# Patient Record
Sex: Female | Born: 1960
Health system: Southern US, Community
[De-identification: ages and names within clinical notes are randomized; demographics above are authoritative.]

## PROBLEM LIST (undated history)

## (undated) DIAGNOSIS — C499 Malignant neoplasm of connective and soft tissue, unspecified: Secondary | ICD-10-CM

## (undated) DIAGNOSIS — M1990 Unspecified osteoarthritis, unspecified site: Secondary | ICD-10-CM

## (undated) DIAGNOSIS — Z9889 Other specified postprocedural states: Secondary | ICD-10-CM

## (undated) DIAGNOSIS — I82409 Acute embolism and thrombosis of unspecified deep veins of unspecified lower extremity: Secondary | ICD-10-CM

## (undated) DIAGNOSIS — R51 Headache: Secondary | ICD-10-CM

## (undated) DIAGNOSIS — R519 Headache, unspecified: Secondary | ICD-10-CM

## (undated) DIAGNOSIS — A609 Anogenital herpesviral infection, unspecified: Secondary | ICD-10-CM

## (undated) DIAGNOSIS — T7840XA Allergy, unspecified, initial encounter: Secondary | ICD-10-CM

## (undated) DIAGNOSIS — R112 Nausea with vomiting, unspecified: Secondary | ICD-10-CM

## (undated) HISTORY — DX: Other specified postprocedural states: R11.2

## (undated) HISTORY — DX: Acute embolism and thrombosis of unspecified deep veins of unspecified lower extremity: I82.409

## (undated) HISTORY — DX: Anogenital herpesviral infection, unspecified: A60.9

## (undated) HISTORY — PX: OTHER SURGICAL HISTORY: SHX169

## (undated) HISTORY — PX: ABDOMINAL HYSTERECTOMY: SHX81

## (undated) HISTORY — DX: Malignant neoplasm of connective and soft tissue, unspecified: C49.9

## (undated) HISTORY — DX: Allergy, unspecified, initial encounter: T78.40XA

## (undated) HISTORY — DX: Other specified postprocedural states: Z98.890

## (undated) HISTORY — PX: NECK SURGERY: SHX720

---

## 1998-01-23 ENCOUNTER — Other Ambulatory Visit: Admission: RE | Admit: 1998-01-23 | Discharge: 1998-01-23 | Payer: Self-pay | Admitting: Gynecology

## 1999-03-24 HISTORY — PX: OTHER SURGICAL HISTORY: SHX169

## 1999-03-27 ENCOUNTER — Other Ambulatory Visit: Admission: RE | Admit: 1999-03-27 | Discharge: 1999-03-27 | Payer: Self-pay | Admitting: Gynecology

## 1999-04-02 ENCOUNTER — Other Ambulatory Visit: Admission: RE | Admit: 1999-04-02 | Discharge: 1999-04-02 | Payer: Self-pay | Admitting: Gynecology

## 1999-10-03 ENCOUNTER — Ambulatory Visit (HOSPITAL_COMMUNITY): Admission: RE | Admit: 1999-10-03 | Discharge: 1999-10-03 | Payer: Self-pay | Admitting: *Deleted

## 1999-10-03 ENCOUNTER — Encounter: Payer: Self-pay | Admitting: *Deleted

## 1999-10-14 ENCOUNTER — Encounter: Admission: RE | Admit: 1999-10-14 | Discharge: 1999-12-04 | Payer: Self-pay | Admitting: *Deleted

## 1999-10-27 ENCOUNTER — Other Ambulatory Visit: Admission: RE | Admit: 1999-10-27 | Discharge: 1999-10-27 | Payer: Self-pay | Admitting: *Deleted

## 1999-11-13 ENCOUNTER — Encounter (INDEPENDENT_AMBULATORY_CARE_PROVIDER_SITE_OTHER): Payer: Self-pay

## 1999-11-13 ENCOUNTER — Ambulatory Visit (HOSPITAL_COMMUNITY): Admission: RE | Admit: 1999-11-13 | Discharge: 1999-11-13 | Payer: Self-pay | Admitting: *Deleted

## 2000-02-27 ENCOUNTER — Other Ambulatory Visit: Admission: RE | Admit: 2000-02-27 | Discharge: 2000-02-27 | Payer: Self-pay | Admitting: *Deleted

## 2001-01-04 ENCOUNTER — Other Ambulatory Visit: Admission: RE | Admit: 2001-01-04 | Discharge: 2001-01-04 | Payer: Self-pay | Admitting: *Deleted

## 2002-04-22 ENCOUNTER — Emergency Department (HOSPITAL_COMMUNITY): Admission: EM | Admit: 2002-04-22 | Discharge: 2002-04-22 | Payer: Self-pay

## 2002-04-24 ENCOUNTER — Other Ambulatory Visit: Admission: RE | Admit: 2002-04-24 | Discharge: 2002-04-24 | Payer: Self-pay | Admitting: *Deleted

## 2002-09-21 ENCOUNTER — Encounter (INDEPENDENT_AMBULATORY_CARE_PROVIDER_SITE_OTHER): Payer: Self-pay | Admitting: Specialist

## 2002-09-21 ENCOUNTER — Ambulatory Visit (HOSPITAL_BASED_OUTPATIENT_CLINIC_OR_DEPARTMENT_OTHER): Admission: RE | Admit: 2002-09-21 | Discharge: 2002-09-21 | Payer: Self-pay | Admitting: *Deleted

## 2003-06-26 ENCOUNTER — Encounter: Admission: RE | Admit: 2003-06-26 | Discharge: 2003-06-26 | Payer: Self-pay | Admitting: Family Medicine

## 2004-07-31 ENCOUNTER — Encounter: Admission: RE | Admit: 2004-07-31 | Discharge: 2004-07-31 | Payer: Self-pay | Admitting: Family Medicine

## 2004-08-12 ENCOUNTER — Encounter: Admission: RE | Admit: 2004-08-12 | Discharge: 2004-08-12 | Payer: Self-pay | Admitting: Family Medicine

## 2004-09-17 ENCOUNTER — Encounter: Admission: RE | Admit: 2004-09-17 | Discharge: 2004-09-17 | Payer: Self-pay | Admitting: Family Medicine

## 2005-09-22 ENCOUNTER — Encounter: Admission: RE | Admit: 2005-09-22 | Discharge: 2005-09-22 | Payer: Self-pay | Admitting: *Deleted

## 2006-04-06 ENCOUNTER — Encounter (INDEPENDENT_AMBULATORY_CARE_PROVIDER_SITE_OTHER): Payer: Self-pay | Admitting: *Deleted

## 2006-04-06 ENCOUNTER — Ambulatory Visit (HOSPITAL_COMMUNITY): Admission: RE | Admit: 2006-04-06 | Discharge: 2006-04-07 | Payer: Self-pay | Admitting: *Deleted

## 2006-10-14 ENCOUNTER — Encounter: Admission: RE | Admit: 2006-10-14 | Discharge: 2006-10-14 | Payer: Self-pay | Admitting: Family Medicine

## 2007-10-17 ENCOUNTER — Encounter: Admission: RE | Admit: 2007-10-17 | Discharge: 2007-10-17 | Payer: Self-pay | Admitting: Family Medicine

## 2008-11-09 ENCOUNTER — Encounter: Admission: RE | Admit: 2008-11-09 | Discharge: 2008-11-09 | Payer: Self-pay | Admitting: Family Medicine

## 2009-07-15 ENCOUNTER — Encounter: Admission: RE | Admit: 2009-07-15 | Discharge: 2009-07-15 | Payer: Self-pay | Admitting: Family Medicine

## 2009-11-11 ENCOUNTER — Encounter: Admission: RE | Admit: 2009-11-11 | Discharge: 2009-11-11 | Payer: Self-pay | Admitting: Family Medicine

## 2010-06-12 ENCOUNTER — Ambulatory Visit (INDEPENDENT_AMBULATORY_CARE_PROVIDER_SITE_OTHER): Payer: Self-pay | Admitting: Gastroenterology

## 2010-06-12 ENCOUNTER — Encounter: Payer: Self-pay | Admitting: Gastroenterology

## 2010-06-12 VITALS — BP 106/80 | HR 84 | Ht 70.0 in | Wt 200.2 lb

## 2010-06-12 DIAGNOSIS — K3189 Other diseases of stomach and duodenum: Secondary | ICD-10-CM

## 2010-06-12 DIAGNOSIS — R1013 Epigastric pain: Secondary | ICD-10-CM | POA: Insufficient documentation

## 2010-06-12 MED ORDER — DEXLANSOPRAZOLE 60 MG PO CPDR: 60.0000 mg | DELAYED_RELEASE_CAPSULE | Freq: Every day | ORAL | Status: AC

## 2010-06-12 NOTE — Assessment & Plan Note (Signed)
The patient's symptoms may be due to ulcer or non-ulcer dyspepsia.  Recommendations  #1 dexilant 60 mg before breakfast  #2 upper endoscopy

## 2010-06-12 NOTE — Progress Notes (Signed)
History of Present Illness: Ms. Michelle Contreras iIs a pleasant, 50 year old white female here for evaluation of abdominal discomfort. For the past several weeks she has noted postprandial abdominal bloating, distention and fullness. Fullness may last for several hours and radiate into her chest and neck. She denies nausea , per se. She is taking omeprazole, without much relief. She is on no gastric irritants including nonsteroidals.     Review of Systems: Pertinent positive and negative review of systems were noted in the above HPI section. All other review of systems were otherwise negative.    Current Medications, Allergies, Past Medical History, Past Surgical History, Family History and Social History were reviewed in [employer] electronic medical record  Vital signs were reviewed in today's medical record. Physical Exam: General: Well developed , well nourished, no acute distress Head: Normocephalic and atraumatic Eyes:  sclerae anicteric, EOMI Ears: Normal auditory acuity Mouth: No deformity or lesions Lungs: Clear throughout to auscultation Heart: Regular rate and rhythm; no murmurs, rubs or bruits Abdomen: Soft, non tender and non distended. No masses, hepatosplenomegaly or hernias noted. Normal Bowel sounds.  There is no succussion splash Rectal:deferred Musculoskeletal: Symmetrical with no gross deformities  Pulses:  Normal pulses noted Extremities: No clubbing, cyanosis, edema or deformities noted Neurological: Alert oriented x 4, grossly nonfocal Psychological:  Alert and cooperative. Normal mood and affect

## 2010-06-12 NOTE — Patient Instructions (Signed)
Upper GI Endoscopy Upper GI endoscopy means using a flexible scope to look at the esophagus, stomach and upper small bowel. This is done to make a diagnosis in people with heartburn, abdominal pain, or abnormal bleeding. Sometimes an endoscope is needed to remove foreign bodies or food that become stuck in the esophagus; it can also be used to take biopsy samples. For the best results, do not eat or drink for 8 hours before having your upper endoscopy.  To perform the endoscopy, you will probably be sedated and your throat will be numbed with a special spray. The endoscope is then slowly passed down your throat (this will not interfere with your breathing). An endoscopy exam takes 15-30 minutes to complete and there is no real pain. Patients rarely remember much about the procedure. The results of the test may take several days if a biopsy or other test is taken.  You may have a sore throat after an endoscopy exam. Serious complications are very rare. Stick to liquids and soft foods until your pain is better. You should not drive a car or operate any dangerous equipment for at least 24 hours after being sedated. SEEK IMMEDIATE MEDICAL CARE IF:  You have severe throat pain.   You have shortness of breath.   You have bleeding problems.   You have a fever.   You have difficulty recovering from your sedation.  Document Released: 04/16/2004 Document Re-Released: 06/03/2009 [facility] Patient Information 2011 [place], [place].  YOUR EGD IS SCHEDULED ON 06/17/2010 AT 2PM

## 2010-06-13 ENCOUNTER — Encounter: Payer: Self-pay | Admitting: Gastroenterology

## 2010-06-16 ENCOUNTER — Encounter: Payer: Self-pay | Admitting: Gastroenterology

## 2010-06-17 ENCOUNTER — Ambulatory Visit (AMBULATORY_SURGERY_CENTER): Payer: Managed Care, Other (non HMO) | Admitting: Gastroenterology

## 2010-06-17 ENCOUNTER — Encounter: Payer: Self-pay | Admitting: Gastroenterology

## 2010-06-17 ENCOUNTER — Other Ambulatory Visit: Payer: Self-pay | Admitting: Gastroenterology

## 2010-06-17 VITALS — BP 109/62 | HR 73 | Temp 97.7°F | Resp 20 | Ht 70.0 in | Wt 199.0 lb

## 2010-06-17 DIAGNOSIS — K2980 Duodenitis without bleeding: Secondary | ICD-10-CM

## 2010-06-17 DIAGNOSIS — R1013 Epigastric pain: Secondary | ICD-10-CM

## 2010-06-17 DIAGNOSIS — K2990 Gastroduodenitis, unspecified, without bleeding: Secondary | ICD-10-CM

## 2010-06-17 DIAGNOSIS — R109 Unspecified abdominal pain: Secondary | ICD-10-CM

## 2010-06-17 DIAGNOSIS — K5289 Other specified noninfective gastroenteritis and colitis: Secondary | ICD-10-CM

## 2010-06-17 NOTE — Patient Instructions (Addendum)
Call office in 1 week if symptoms are not improved with dexilant. Pt given dc instructions & reviewed w/ pt & care partner to call office set up appointment w/ Dr. Arlyce Dice for 4-6 weeks.

## 2010-06-18 ENCOUNTER — Telehealth: Payer: Self-pay | Admitting: *Deleted

## 2010-06-18 NOTE — Telephone Encounter (Signed)

## 2010-06-24 ENCOUNTER — Encounter: Payer: Self-pay | Admitting: Gastroenterology

## 2010-06-24 NOTE — Procedures (Signed)
Summary: Upper Endoscopy  Patient: Michelle Contreras Note: All result statuses are Final unless otherwise noted.  Tests: (1) Upper Endoscopy (EGD)   EGD Upper Endoscopy       DONE     [facility]     [address]     [place], [place]  [address]          ENDOSCOPY PROCEDURE REPORT          PATIENT:  Michelle, Contreras  MR#:  [id number]     BIRTHDATE:  [birth year], 49 yrs. old  GENDER:  female          ENDOSCOPIST:  Barbette Hair. Arlyce Dice, MD     Referred by:          PROCEDURE DATE:  06/17/2010     PROCEDURE:  EGD, diagnostic 43235     ASA CLASS:  Class I     INDICATIONS:  abdominal pain, dyspepsia          MEDICATIONS:   Fentanyl 100 mcg IV, Versed 10 mg IV,     glycopyrrolate (Robinal) 0.2 mg IV, 0.6cc simethancone 0.6 cc PO     TOPICAL ANESTHETIC:  Exactacain Spray          DESCRIPTION OF PROCEDURE:   After the risks benefits and     alternatives of the procedure were thoroughly explained, informed     consent was obtained.  The LB GIF-H180 [id number] endoscope was     introduced through the mouth and advanced to the third portion of     the duodenum, without limitations.  The instrument was slowly     withdrawn as the mucosa was fully examined.     <<PROCEDUREIMAGES>>          Duodenitis was found in the bulb of the duodenum. Mild erythema     and edema. Bx taken (see image1).  Otherwise the examination was     normal (see image2, image4, image5, image6, image7, image8, and     image9).    Retroflexed views revealed no abnormalities.    The     scope was then withdrawn from the patient and the procedure     completed.          COMPLICATIONS:  None          ENDOSCOPIC IMPRESSION:     1) Duodenitis in the bulb of duodenum     2) Otherwise normal examination     RECOMMENDATIONS:     1) continue PPI; call office if symptoms do no improve over next     week     2) Call office next 2-3 days to schedule an office appointment     for 4-6 weeks          REPEAT EXAM:   No          ______________________________     Barbette Hair. Arlyce Dice, MD          CC:          n.     eSIGNED:   Barbette Hair. Kaplan at 06/17/2010 02:56 PM          Lemar Lofty, [id number]  Note: An exclamation mark (!) indicates a result that was not dispersed into the flowsheet. Document Creation Date: 06/17/2010 2:56 PM _______________________________________________________________________  (1) Order result status: Final Collection or observation date-time: 06/17/2010 14:42 Requested date-time:  Receipt date-time:  Reported date-time:  Referring Physician:   Ordering Physician: Melvia Heaps [phone]) Specimen Source:  Source: Launa Grill Order Number: [id number] Lab site:

## 2010-07-31 ENCOUNTER — Telehealth: Payer: Self-pay | Admitting: Gastroenterology

## 2010-07-31 NOTE — Telephone Encounter (Signed)
Spoke with pt and rescheduled her appt with Dr. Arlyce Dice to 08/14/10@9 :30am. Pt aware of appt date and time.

## 2010-08-04 ENCOUNTER — Ambulatory Visit: Payer: Managed Care, Other (non HMO) | Admitting: Gastroenterology

## 2010-08-08 NOTE — Op Note (Signed)
   NAME:  Michelle Contreras, Michelle Contreras                         ACCOUNT NO.:  [id number]   MEDICAL RECORD NO.:  [id number]                   PATIENT TYPE:  AMB   LOCATION:  [facility]                                 FACILITY:  [facility]   PHYSICIAN:  Gerri Spore B. Earlene Plater, M.D.               DATE OF BIRTH:  07/11/1960   DATE OF PROCEDURE:  09/21/2002  DATE OF DISCHARGE:                                 OPERATIVE REPORT   PREOPERATIVE DIAGNOSIS:  Nine-week missed abortion.   POSTOPERATIVE DIAGNOSIS:  Nine-week missed abortion.   PROCEDURE:  Suction curettage.   SURGEON:  Chester Holstein. Earlene Plater, M.D.   ANESTHESIA:  LMA general.   FINDINGS:  Examination under anesthesia:  A 10-week size anteverted uterus,  no adnexal masses.   SPECIMENS:  Products of conception.   ESTIMATED BLOOD LOSS:  300 due to mild uterine atony, treated with  Methergine 0.2 mg IM x1 and uterine massage.   COMPLICATIONS:  None.   INDICATIONS:  Patient found to have a nine-week missed abortion in the  office yesterday when no fetal heart tones were heard at 10 weeks.  The  ultrasound confirmed the embryonic demise.  The patient desires operative  management.   PROCEDURE:  The patient was taken to the operating room and LMA general  obtained.  She was placed in the ski position and prepped and draped in  standard fashion.  Bladder emptied with red rubber catheter.  Speculum  inserted.  Anterior lip of the cervix grasped with a single-tooth tenaculum,  cervix easily dilated to a #27.   A #9 curved suction cannula was inserted with suction on.  Products  returned.  Several passes were needed to clear the uterus.  There was brisk  bleeding noted after evacuation began but no concern for perforation.  The  uterus was gently curetted and a gritty texture noted.  Suction passed once  again, no additional products returned.  The patient was given Methergine  0.2 mg IM and uterine massage, given the brisk bleeding.   After the above measures, the  bleeding promptly ceased.  The cervix was  observed and no additional bleeding encountered.  Repeat pelvic exam showed  no change from prior to surgery.   The patient tolerated the procedure well and there were no complications.  She was taken to the recovery room awake and alert, in stable condition.                                               Gerri Spore B. Earlene Plater, M.D.    WBD/MEDQ  D:  09/21/2002  T:  09/21/2002  Job:  [id number]

## 2010-08-08 NOTE — Op Note (Signed)
[facility]  Patient:    Michelle Contreras, Michelle Contreras                      MRN: [id number] Proc. Date: 11/13/99 Adm. Date:  [id number] Attending:  Marina Gravel B                           Operative Report  PREOPERATIVE DIAGNOSES:       1. Desires elective termination of an 8-week                                  pregnancy.                               2. 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  POSTOPERATIVE DIAGNOSES:      1. Desires elective termination of an 8-week                                  pregnancy.                               2. 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  OPERATION:                    Suction dilatation, evacuation, and curettage.  SURGEON:                      Marina Gravel, M.D.  ASSISTANT:  ANESTHESIA:                   MAC and 20 cc of 1% lidocaine as a paracervical block.  ESTIMATED BLOOD LOSS:         30 cc.  FINDINGS:                     At examination under anesthesia, approximately an 8-week size anterior uterus.  No adnexal masses palpable.  COMPLICATIONS:                None.  DESCRIPTION OF PROCEDURE:     The patient was taken to the operating room and MAC anesthesia obtained.  She was placed in the ski position and prepped and draped in the standard fashion.  The bladder was emptied with a straight catheter.  The patient was examined under anesthesia with the above findings noted.  A speculum was placed into the vagina and the cervix visualized.  It was infiltrated with 20 cc of 1% lidocaine as a paracervical block.  The cervix was then grasped with a tenaculum on the anterior lip.  The cervix was then easily dilated to a #21 without difficulty.  A #7 suction curette was then inserted into the endometrial cavity.  Suction  was obtained and products returned.  This was continued for two  to three more passes until no tissue returned.  Then the uterus was gently curetted.  A moderate amount of tissue returned from the anterior portion of the cavity.  When a gritty texture was encountered throughout, curetting was discontinued.  Suction curette was placed one more time until no products returned.  All instruments were removed from the vagina and the cervix was hemostatic. The patient tolerated the procedure well without complications.  She was taken to the recovery room, awake, alert, and in stable condition.  The patients blood type is A+. DD:  11/13/99 TD:  11/14/99 Job: [id number]

## 2010-08-08 NOTE — Op Note (Signed)
Michelle Contreras, Michelle Contreras NO.:  [id number]   MEDICAL RECORD NO.:  [id number]          PATIENT TYPE:  AMB   LOCATION:  [facility]                           FACILITY:  [facility]   PHYSICIAN:  [place] B. Earlene Plater, M.D.  DATE OF BIRTH:  Sep 30, 1960   DATE OF PROCEDURE:  04/06/2006  DATE OF DISCHARGE:                               OPERATIVE REPORT   PREOPERATIVE DIAGNOSES:  1. Abnormal uterine bleeding.  2. History of menstrual migraines.  3. Endometriosis.   POSTOPERATIVE DIAGNOSES:  1. Abnormal uterine bleeding.  2. History of menstrual migraines.  3. Endometriosis.   PROCEDURE:  Total laparoscopic hysterectomy, bilateral salpingo-  oophorectomy.   SURGEON:  Chester Holstein. Earlene Plater, M.D.   ASSISTANT:  Lenoard Aden, M.D.   ANESTHESIA:  General.   FINDINGS:  Endometriosis of the posterior cul-de-sac.   SPECIMENS:  Was a 276 gram uterus, cervix, tubes and ovaries, submitted  to pathology.   ESTIMATED BLOOD LOSS:  Was 50 mL.   COMPLICATIONS:  None.   INDICATIONS FOR PROCEDURE:  A patient with the above history for  definitive surgical therapy.  Requests a bilateral salpingo-  oophorectomy, due to a history of endometriosis and menstrual migraines.  Understands she will no doubt need hormone replacement therapy.  We have  discussed a WHI trial with Premarin only, showing only an increased risk  for VTE without the increased risk of breast cancer and heart disease,  still not 100% clear.  Also discussed the reduced risk of osteoporosis  and colon cancer.  The patient was advised the risks of surgery,  including infection, bleeding, damage to tissue or surrounding organs  and the potential need to convert to another type of hysterectomy.   DESCRIPTION OF PROCEDURE:  The patient was taken to the operating room  and general anesthesia was obtained.  She was placed in the [place]  stirrups and prepped and draped in the standard fashion.  A Foley  catheter was inserted to the  bladder.  A Rumi device was assembled and  secured in the standard manner, after the uterus was sounded to 10 cm.   A 10 mm incision placed in the umbilicus and carried sharply to the  fascia.  The fascia was divided sharply and elevated with Kocher clamps.  The posterior sheath and peritoneum were elevated and entered sharply.  A pursestring suture of #0 Vicryl was placed around the fascial defect.  A Hasson cannula was inserted and secured.  A pneumoperitoneum with CO2  gas.  An 11 mm XR trocar was placed in the left lower quadrant and a 5  mm on the right, each under direct laparoscopic visualization.   Trendelenburg position obtained.  The bowel was mobilized superiorly.  The course of each ureter identified and found to be well out of the  way.  The left IP ligament was placed on traction, sealed and divided  with the Gyrus bipolar cutting forceps.  The round ligament similarly  sealed and divided.  The broad ligament was skeletonized.  The bladder  flap created sharply.  The entire procedure was repeated on the  right  side in the same manner.  The left uterine artery was then skeletonized,  sealed and divided and repeated on the right side in the same manner.   The KOH cup was elevated and a colpotomy made anteriorly first with a  Plasma spatula, carried around to the angles laterally.  A posterior  colpotomy made similarly.  Each angle was then sealed and divided with  the Gyrus bipolar.  The specimen was pulled into the vagina, which  maintained a pneumo.  The vaginal cuff was closed with interrupted plain  sutures of #0 Vicryl laparoscopically.  Hemostasis obtained.  Lines of  dissection were inspected and were hemostatic and remained hemostatic,  with a reduction of the pneumoperitoneum.  The pelvis was irrigated.  No  additional abnormalities seen.  Therefore the procedure was terminated.   The inferior ports were removed and their sites inspected  laparoscopically and were  hemostatic.  The scope removed and gas  released.  The Hasson cannula removed.  The umbilical incision was  elevated and the fascia stitch tied down.  This obliterated the fascial  defect.  No intra-abdominal contents herniated through prior to closure.  The skin was closed at each site with Dermabond.   The patient tolerated the procedure with no known complications.  She  was taken to the recovery room  alert and in stable condition.      Gerri Spore B. Earlene Plater, M.D.  Electronically Signed     WBD/MEDQ  D:  04/06/2006  T:  04/06/2006  Job:  [id number]

## 2010-08-08 NOTE — H&P (Signed)
   NAME:  Michelle Contreras, Michelle Contreras                         ACCOUNT NO.:  [id number]   MEDICAL RECORD NO.:  [id number]                   PATIENT TYPE:  HAMB   LOCATION:                                       FACILITY:  [facility]   PHYSICIAN:  Gerri Spore B. Earlene Plater, M.D.               DATE OF BIRTH:  01/18/1961   DATE OF ADMISSION:  09/21/2002  DATE OF DISCHARGE:                                HISTORY & PHYSICAL   PREOPERATIVE DIAGNOSIS:  A nine-week missed abortion.   PROCEDURE:  Suction curettage.   HISTORY OF PRESENT ILLNESS:  A 50 year old white female gravida 2, para 0,  A1, 10+ weeks by LMP, seen in the office on September 20, 2002, for first  prenatal visit.  She was found to have absent fetal heart tones.  Subsequent  pelvic ultrasound confirmed nine-week embryonic demise.  The patient desires  operative treatment.   PAST MEDICAL HISTORY:  1. Lower extremity sarcoma.  2. Status post below knee amputation on the right.  3. History of HSV, no recent symptoms.   PAST SURGICAL HISTORY:  1. Amputation as above.  2. Therapeutic abortion x1.   MEDICATIONS:  Prenatal vitamins.   ALLERGIES:  CODEINE and ADHESIVE SURGICAL TAPES.   HABITS:  No alcohol, tobacco or drugs.   FAMILY HISTORY:  Noncontributory.   REVIEW OF SYSTEMS:  Otherwise negative.   PHYSICAL EXAMINATION:  GENERAL APPEARANCE:  Alert and oriented and in no  acute distress.  VITAL SIGNS:  Height 5 feet 9 inches, weight 189 pounds, blood pressure  100/80.  SKIN:  Warm without lesions.  HEART:  Regular rate and rhythm.  LUNGS: Clear to auscultation.  ABDOMEN:  Benign.  No hernias.  EXTREMITIES:  Lower extremities status post right below-knee amputation with  prosthetic present.  PELVIC:  Normal external genitalia, vagina and cervix normal.  Uterus 9 to  10-week size, mid plane, no fetal heart tones noted.   Pelvic ultrasound confirmed a 9-week __________; no fetal cardiac  activities.   ASSESSMENT:  A nine-week embryonic demise.   Desires operative management.   PLAN:  Suction curettage.  Risks discussed including infection, bleeding,  uterine perforation, injury to surrounding organs; all questions answered.  The patient wished to proceed.                                               Gerri Spore B. Earlene Plater, M.D.    WBD/MEDQ  D:  09/20/2002  T:  09/20/2002  Job:  [id number]

## 2010-08-14 ENCOUNTER — Encounter: Payer: Self-pay | Admitting: Gastroenterology

## 2010-08-14 ENCOUNTER — Ambulatory Visit (INDEPENDENT_AMBULATORY_CARE_PROVIDER_SITE_OTHER): Payer: Managed Care, Other (non HMO) | Admitting: Gastroenterology

## 2010-08-14 VITALS — BP 124/68 | HR 80 | Ht 70.0 in | Wt 194.0 lb

## 2010-08-14 DIAGNOSIS — Z800 Family history of malignant neoplasm of digestive organs: Secondary | ICD-10-CM

## 2010-08-14 MED ORDER — PEG-KCL-NACL-NASULF-NA ASC-C 100 G PO SOLR: 1.0000 | ORAL | Status: DC

## 2010-08-14 NOTE — Progress Notes (Signed)
History of Present Illness:  Michelle Contreras returns for followup of her abdominal pain. Pain has entirely subsided after restricting her diet. She no longer takes caffeine and tries to maintain a diet heavy in vegetables. On this regimen she is pain-free. Prior to this she was still having some discomfort despite taking omeprazole.  Family history is pertinent for her father who developed colon cancer in his 92s. Her father's 2 uncles had colon cancer. Her last colonoscopy was 2004.    Review of Systems: Pertinent positive and negative review of systems were noted in the above HPI section. All other review of systems were otherwise negative.    Current Medications, Allergies, Past Medical History, Past Surgical History, Family History and Social History were reviewed in [employer] electronic medical record  Vital signs were reviewed in today's medical record. Physical Exam: General: Well developed , well nourished, no acute distress

## 2010-08-14 NOTE — Patient Instructions (Signed)
You have been scheduled for a Colonoscopy. Written instructions have been provided. Your Prep has been sent to your pharmacy.  Colonoscopy A colonoscopy is an exam to evaluate your entire colon. In this exam, your colon is cleansed. A long fiberoptic tube is inserted through your rectum and into your colon. The fiberoptic scope (endoscope) is a long bundle of enclosed and very flexible fibers. These fibers transmit light to the area examined and send images from that area to your caregiver. Discomfort is usually minimal. You may be given a drug to help you sleep (sedative) during or prior to the procedure. This exam helps to detect lumps (tumors), polyps, inflammation, and areas of bleeding. Your caregiver may also take a small piece of tissue (biopsy) that will be examined under a microscope. BEFORE THE PROCEDURE  A clear liquid diet may be required for 2 days before the exam.   Liquid injections (enemas) or laxatives may be required.   A large amount of electrolyte solution may be given to you to drink over a short period of time. This solution is used to clean out your colon.   You should be present  prior to your procedure or as directed by your caregiver.   Check in at the admissions desk to fill out necessary forms if not preregistered. There will be consent forms to sign prior to the procedure. If accompanied by friends or family, there is a waiting area for them while you are having your procedure.  LET YOUR CAREGIVER KNOW ABOUT:  Allergies to food or medicine.  Medicines taken, including vitamins, herbs, eyedrops, over-the-counter medicines, and creams.   Use of steroids (by mouth or creams).   Previous problems with anesthetics or numbing medicines.   History of bleeding problems or blood clots.  Previous surgery.   Other health problems, including diabetes and kidney problems.   Possibility of pregnancy, if this applies.   AFTER THE PROCEDURE  If you received a sedative  and/or pain medicine, you will need to arrange for someone to drive you home.   Occasionally, there is a little blood passed with the first bowel movement. DO NOT be concerned.  HOME CARE INSTRUCTIONS  It is not unusual to pass moderate amounts of gas and experience mild abdominal cramping following the procedure. This is due to air being used to inflate your colon during the exam. Walking or a warm pack on your belly (abdomen) may help.   You may resume all normal meals and activities after sedatives and medicines have worn off.   Only take over-the-counter or prescription medicines for pain, discomfort, or fever as directed by your caregiver. DO NOT use aspirin or blood thinners if a biopsy was taken. Consult your caregiver for medicine usage if biopsies were taken.  FINDING OUT THE RESULTS OF YOUR TEST Not all test results are available during your visit. If your test results are not back during the visit, make an appointment with your caregiver to find out the results. Do not assume everything is normal if you have not heard from your caregiver or the medical facility. It is important for you to follow up on all of your test results. SEEK IMMEDIATE MEDICAL CARE IF:  You have an oral temperature above , not controlled by medicine.   You pass large blood clots or fill a toilet with blood following the procedure. This may also occur 10 to 14 days following the procedure. This is more likely if a biopsy was taken.  You develop abdominal pain that keeps getting worse and cannot be relieved with medicine.  Document Released: 03/06/2000 Document Re-Released: 06/03/2009 [facility] Patient Information 2011 [place], [place].

## 2010-08-22 HISTORY — PX: COLONOSCOPY: SHX174

## 2010-09-16 ENCOUNTER — Telehealth: Payer: Self-pay | Admitting: Gastroenterology

## 2010-09-16 MED ORDER — PEG-KCL-NACL-NASULF-NA ASC-C 100 G PO SOLR: 1.0000 | ORAL | Status: DC

## 2010-09-16 NOTE — Telephone Encounter (Signed)
2nd time being sent to [facility] [place],

## 2010-09-18 ENCOUNTER — Ambulatory Visit (AMBULATORY_SURGERY_CENTER): Payer: Managed Care, Other (non HMO) | Admitting: Gastroenterology

## 2010-09-18 ENCOUNTER — Encounter: Payer: Self-pay | Admitting: Gastroenterology

## 2010-09-18 VITALS — BP 123/58 | HR 71 | Temp 98.4°F | Resp 21 | Ht 70.0 in | Wt 194.0 lb

## 2010-09-18 DIAGNOSIS — Z1211 Encounter for screening for malignant neoplasm of colon: Secondary | ICD-10-CM

## 2010-09-18 DIAGNOSIS — Z800 Family history of malignant neoplasm of digestive organs: Secondary | ICD-10-CM

## 2010-09-18 MED ORDER — SODIUM CHLORIDE 0.9 % IV SOLN: 500.0000 mL | INTRAVENOUS | Status: DC

## 2010-09-18 NOTE — Patient Instructions (Signed)
Findings: Normal Colon  Recommendations:  Repeat colonoscopy in 5 years

## 2010-09-19 ENCOUNTER — Telehealth: Payer: Self-pay | Admitting: *Deleted

## 2010-09-19 NOTE — Telephone Encounter (Signed)

## 2010-12-02 ENCOUNTER — Other Ambulatory Visit: Payer: Self-pay | Admitting: Family Medicine

## 2010-12-02 DIAGNOSIS — Z1231 Encounter for screening mammogram for malignant neoplasm of breast: Secondary | ICD-10-CM

## 2010-12-09 ENCOUNTER — Ambulatory Visit
Admission: RE | Admit: 2010-12-09 | Discharge: 2010-12-09 | Disposition: A | Discharge status: Home / Self Care | Payer: Managed Care, Other (non HMO) | Source: Ambulatory Visit | Attending: Family Medicine | Admitting: Family Medicine

## 2010-12-09 DIAGNOSIS — Z1231 Encounter for screening mammogram for malignant neoplasm of breast: Secondary | ICD-10-CM

## 2011-07-07 DIAGNOSIS — L821 Other seborrheic keratosis: Secondary | ICD-10-CM | POA: Insufficient documentation

## 2011-07-10 DIAGNOSIS — M25559 Pain in unspecified hip: Secondary | ICD-10-CM | POA: Insufficient documentation

## 2011-07-10 DIAGNOSIS — Z89519 Acquired absence of unspecified leg below knee: Secondary | ICD-10-CM | POA: Insufficient documentation

## 2011-09-06 ENCOUNTER — Inpatient Hospital Stay (HOSPITAL_COMMUNITY)
Admission: EM | Admit: 2011-09-06 | Discharge: 2011-09-08 | DRG: 419 | Disposition: A | Discharge status: Home / Self Care | Payer: Managed Care, Other (non HMO) | Attending: Surgery | Admitting: Surgery

## 2011-09-06 ENCOUNTER — Emergency Department (HOSPITAL_COMMUNITY): Payer: Managed Care, Other (non HMO)

## 2011-09-06 ENCOUNTER — Encounter (HOSPITAL_COMMUNITY): Payer: Self-pay | Admitting: *Deleted

## 2011-09-06 DIAGNOSIS — K8010 Calculus of gallbladder with chronic cholecystitis without obstruction: Secondary | ICD-10-CM

## 2011-09-06 DIAGNOSIS — Z9079 Acquired absence of other genital organ(s): Secondary | ICD-10-CM

## 2011-09-06 DIAGNOSIS — K8000 Calculus of gallbladder with acute cholecystitis without obstruction: Principal | ICD-10-CM | POA: Diagnosis present

## 2011-09-06 DIAGNOSIS — Z9889 Other specified postprocedural states: Secondary | ICD-10-CM

## 2011-09-06 DIAGNOSIS — K819 Cholecystitis, unspecified: Secondary | ICD-10-CM

## 2011-09-06 DIAGNOSIS — S88119A Complete traumatic amputation at level between knee and ankle, unspecified lower leg, initial encounter: Secondary | ICD-10-CM

## 2011-09-06 DIAGNOSIS — Z8249 Family history of ischemic heart disease and other diseases of the circulatory system: Secondary | ICD-10-CM

## 2011-09-06 DIAGNOSIS — Z87891 Personal history of nicotine dependence: Secondary | ICD-10-CM

## 2011-09-06 DIAGNOSIS — K219 Gastro-esophageal reflux disease without esophagitis: Secondary | ICD-10-CM | POA: Diagnosis present

## 2011-09-06 DIAGNOSIS — K810 Acute cholecystitis: Secondary | ICD-10-CM | POA: Diagnosis present

## 2011-09-06 DIAGNOSIS — Z79899 Other long term (current) drug therapy: Secondary | ICD-10-CM

## 2011-09-06 DIAGNOSIS — Z8589 Personal history of malignant neoplasm of other organs and systems: Secondary | ICD-10-CM

## 2011-09-06 DIAGNOSIS — Z803 Family history of malignant neoplasm of breast: Secondary | ICD-10-CM

## 2011-09-06 DIAGNOSIS — Z800 Family history of malignant neoplasm of digestive organs: Secondary | ICD-10-CM

## 2011-09-06 DIAGNOSIS — Z833 Family history of diabetes mellitus: Secondary | ICD-10-CM

## 2011-09-06 LAB — URINALYSIS, ROUTINE W REFLEX MICROSCOPIC
Bilirubin Urine: NEGATIVE
Glucose, UA: NEGATIVE mg/dL
Ketones, ur: 15 mg/dL — AB
Nitrite: NEGATIVE
Protein, ur: NEGATIVE mg/dL
Specific Gravity, Urine: 1.02 (ref 1.005–1.030)
Urobilinogen, UA: 1 mg/dL (ref 0.0–1.0)
pH: 7 (ref 5.0–8.0)

## 2011-09-06 LAB — COMPREHENSIVE METABOLIC PANEL WITH GFR
ALT: 29 U/L (ref 0–35)
Alkaline Phosphatase: 67 U/L (ref 39–117)
BUN: 15 mg/dL (ref 6–23)
CO2: 27 meq/L (ref 19–32)
Chloride: 103 meq/L (ref 96–112)
GFR calc Af Amer: 77 mL/min — ABNORMAL LOW (ref >90)
Glucose, Bld: 115 mg/dL — ABNORMAL HIGH (ref 70–99)
Potassium: 4.1 meq/L (ref 3.5–5.1)
Total Bilirubin: 0.6 mg/dL (ref 0.3–1.2)

## 2011-09-06 LAB — POCT I-STAT, CHEM 8
Calcium, Ion: 1.23 mmol/L (ref 1.12–1.32)
Glucose, Bld: 120 mg/dL — ABNORMAL HIGH (ref 70–99)
HCT: 37 % (ref 36.0–46.0)
Hemoglobin: 12.6 g/dL (ref 12.0–15.0)
TCO2: 26 mmol/L (ref 0–100)

## 2011-09-06 LAB — COMPREHENSIVE METABOLIC PANEL
AST: 21 U/L (ref 0–37)
Albumin: 3.7 g/dL (ref 3.5–5.2)
Calcium: 9.4 mg/dL (ref 8.4–10.5)
Creatinine, Ser: 0.97 mg/dL (ref 0.50–1.10)
GFR calc non Af Amer: 66 mL/min — ABNORMAL LOW (ref >90)
Sodium: 141 mEq/L (ref 135–145)
Total Protein: 7.3 g/dL (ref 6.0–8.3)

## 2011-09-06 LAB — CBC
HCT: 37.2 % (ref 36.0–46.0)
Hemoglobin: 12.6 g/dL (ref 12.0–15.0)
MCH: 29.8 pg (ref 26.0–34.0)
MCHC: 33.9 g/dL (ref 30.0–36.0)
MCV: 87.9 fL (ref 78.0–100.0)
Platelets: 158 10*3/uL (ref 150–400)
RBC: 4.23 MIL/uL (ref 3.87–5.11)
RDW: 13.3 % (ref 11.5–15.5)
WBC: 8 10*3/uL (ref 4.0–10.5)

## 2011-09-06 LAB — URINE MICROSCOPIC-ADD ON

## 2011-09-06 LAB — LIPASE, BLOOD: Lipase: 29 U/L (ref 11–59)

## 2011-09-06 MED ORDER — IOHEXOL 300 MG/ML  SOLN: 20.0000 mL | INTRAMUSCULAR | Status: AC

## 2011-09-06 MED ORDER — IOHEXOL 300 MG/ML  SOLN
100.0000 mL | Freq: Once | INTRAMUSCULAR | Status: AC | PRN
  Administered 2011-09-06: 100 mL via INTRAVENOUS

## 2011-09-06 MED ORDER — SODIUM CHLORIDE 0.9 % IV SOLN
Freq: Once | INTRAVENOUS | Status: AC
  Administered 2011-09-06: 13:00:00 via INTRAVENOUS

## 2011-09-06 MED ORDER — DEXTROSE 5 % IV SOLN
500.0000 mg | Freq: Once | INTRAVENOUS | Status: AC
  Administered 2011-09-06: 500 mg via INTRAVENOUS
  Filled 2011-09-06: qty 500

## 2011-09-06 MED ORDER — ACETAMINOPHEN 650 MG RE SUPP: 650.0000 mg | Freq: Four times a day (QID) | RECTAL | Status: DC | PRN

## 2011-09-06 MED ORDER — ONDANSETRON HCL 4 MG/2ML IJ SOLN
4.0000 mg | Freq: Once | INTRAMUSCULAR | Status: AC
  Administered 2011-09-06: 4 mg via INTRAVENOUS

## 2011-09-06 MED ORDER — HYDROMORPHONE HCL PF 1 MG/ML IJ SOLN
1.0000 mg | Freq: Once | INTRAMUSCULAR | Status: AC
  Administered 2011-09-06: 1 mg via INTRAVENOUS
  Filled 2011-09-06: qty 1

## 2011-09-06 MED ORDER — SODIUM CHLORIDE 0.9 % IV SOLN
INTRAVENOUS | Status: DC
  Administered 2011-09-06: 23:00:00 via INTRAVENOUS

## 2011-09-06 MED ORDER — PIPERACILLIN-TAZOBACTAM 3.375 G IVPB 30 MIN
3.3750 g | Freq: Once | INTRAVENOUS | Status: AC
  Administered 2011-09-06 – 2011-09-07 (×2): 3.375 g via INTRAVENOUS
  Filled 2011-09-06: qty 50

## 2011-09-06 MED ORDER — PANTOPRAZOLE SODIUM 40 MG IV SOLR
40.0000 mg | Freq: Every day | INTRAVENOUS | Status: DC
  Administered 2011-09-06: 40 mg via INTRAVENOUS
  Filled 2011-09-06 (×2): qty 40

## 2011-09-06 MED ORDER — METOCLOPRAMIDE HCL 5 MG/ML IJ SOLN
10.0000 mg | Freq: Once | INTRAMUSCULAR | Status: AC
  Administered 2011-09-06: 10 mg via INTRAVENOUS
  Filled 2011-09-06: qty 2

## 2011-09-06 MED ORDER — MORPHINE SULFATE 2 MG/ML IJ SOLN
2.0000 mg | INTRAMUSCULAR | Status: DC | PRN
  Administered 2011-09-06 – 2011-09-07 (×4): 2 mg via INTRAVENOUS
  Filled 2011-09-06 (×4): qty 1

## 2011-09-06 MED ORDER — SODIUM CHLORIDE 0.9 % IV BOLUS (SEPSIS)
1000.0000 mL | Freq: Once | INTRAVENOUS | Status: AC
  Administered 2011-09-06: 1000 mL via INTRAVENOUS

## 2011-09-06 MED ORDER — ONDANSETRON HCL 4 MG/2ML IJ SOLN
4.0000 mg | Freq: Four times a day (QID) | INTRAMUSCULAR | Status: DC | PRN
  Administered 2011-09-07: 4 mg via INTRAVENOUS
  Filled 2011-09-06 (×2): qty 2

## 2011-09-06 MED ORDER — DIPHENHYDRAMINE HCL 50 MG/ML IJ SOLN
12.5000 mg | Freq: Once | INTRAMUSCULAR | Status: AC
  Administered 2011-09-06: 12.5 mg via INTRAVENOUS
  Filled 2011-09-06: qty 1

## 2011-09-06 MED ORDER — ACETAMINOPHEN 325 MG PO TABS
650.0000 mg | ORAL_TABLET | Freq: Four times a day (QID) | ORAL | Status: DC | PRN
  Administered 2011-09-07 (×2): 650 mg via ORAL
  Filled 2011-09-06 (×2): qty 2

## 2011-09-06 MED ORDER — ONDANSETRON HCL 4 MG/2ML IJ SOLN
4.0000 mg | Freq: Once | INTRAMUSCULAR | Status: AC
  Administered 2011-09-06: 4 mg via INTRAVENOUS
  Filled 2011-09-06: qty 2

## 2011-09-06 MED ORDER — PIPERACILLIN-TAZOBACTAM 3.375 G IVPB
3.3750 g | Freq: Three times a day (TID) | INTRAVENOUS | Status: DC
  Administered 2011-09-07: 3.375 g via INTRAVENOUS
  Filled 2011-09-06 (×3): qty 50

## 2011-09-06 MED ORDER — ACETAMINOPHEN 325 MG PO TABS
975.0000 mg | ORAL_TABLET | Freq: Four times a day (QID) | ORAL | Status: DC | PRN
  Administered 2011-09-06 (×2): 975 mg via ORAL
  Filled 2011-09-06 (×2): qty 3

## 2011-09-06 MED ORDER — DEXTROSE 5 % IV SOLN
1.0000 g | Freq: Once | INTRAVENOUS | Status: AC
  Administered 2011-09-06: 1 g via INTRAVENOUS
  Filled 2011-09-06: qty 10

## 2011-09-06 NOTE — ED Provider Notes (Signed)
History     CSN: [id number]  Arrival date & time 09/06/11  1112   First MD Initiated Contact with Patient 09/06/11 1209      Chief Complaint  Patient presents with  . Fever  . Headache    (Consider location/radiation/quality/duration/timing/severity/associated sxs/prior treatment) HPI  Past Medical History  Diagnosis Date  . HSV (herpes simplex virus) anogenital infection     Hx of  . Sarcoma     Lower Extremitiy  . GERD (gastroesophageal reflux disease)     Past Surgical History  Procedure Date  . Amputation of lower limb     left leg, below knee  . Abdominal hysterectomy   . Neck surgery   . Removal of benign growth to r breast     Family History  Problem Relation Age of Onset  . Breast cancer Father   . Colon cancer Father   . Colon polyps Father   . Heart disease Father     ?  . Kidney cancer Paternal Aunt   . Diabetes Mother   . Kidney disease Mother     currently on dialysis  . Diabetes Maternal Grandfather   . Colon polyps Brother     History  Substance Use Topics  . Smoking status: Former Games developer  . Smokeless tobacco: Not on file   Comment: smoked from age [age]. no longer smokes  . Alcohol Use: 0.5 oz/week    1 drink(s) per week     occasional-weekends    OB History    Grav Para Term Preterm Abortions TAB SAB Ect Mult Living                  Review of Systems  Allergies  Review of patient's allergies indicates no known allergies.  Home Medications   Current Outpatient Rx  Name Route Sig Dispense Refill  . B COMPLEX VITAMINS PO CAPS Oral Take 1 capsule by mouth daily.      Marland Kitchen VITAMIN D-3 PO Oral Take 1,000 mg by mouth. Take 1 capsule by mouth once daily    . ESTRADIOL 0.5 MG PO TABS Oral Take 0.5 mg by mouth daily.      Marland Kitchen HYDROCODONE-ACETAMINOPHEN 5-325 MG PO TABS Oral Take 1 tablet by mouth every 6 (six) hours as needed. For pain    . IBUPROFEN 200 MG PO TABS Oral Take 200 mg by mouth every 6 (six) hours as needed. For pain    .  LIDODERM 5 % EX PTCH Transdermal Place 1 patch onto the skin daily.     Marland Kitchen ONE-DAILY MULTI VITAMINS PO TABS Oral Take 1 tablet by mouth daily.      . SUMATRIPTAN SUCCINATE 100 MG PO TABS        BP 116/66  Pulse 94  Temp 99.3 F (37.4 C) (Oral)  Resp 14  SpO2 97%  Physical Exam  ED Course  Procedures (including critical care time)  Labs Reviewed  URINALYSIS, ROUTINE W REFLEX MICROSCOPIC - Abnormal; Notable for the following:    Hgb urine dipstick TRACE (*)     Ketones, ur 15 (*)     Leukocytes, UA TRACE (*)     All other components within normal limits  POCT I-STAT, CHEM 8 - Abnormal; Notable for the following:    Glucose, Bld 120 (*)     All other components within normal limits  COMPREHENSIVE METABOLIC PANEL - Abnormal; Notable for the following:    Glucose, Bld 115 (*)     GFR  calc non Af Amer 66 (*)     GFR calc Af Amer 77 (*)     All other components within normal limits  URINE MICROSCOPIC-ADD ON - Abnormal; Notable for the following:    Squamous Epithelial / LPF FEW (*)     All other components within normal limits  CBC  LIPASE, BLOOD  CULTURE, BLOOD (ROUTINE X 2)  CULTURE, BLOOD (ROUTINE X 2)  URINE CULTURE   US Abdomen Complete  09/06/2011  *RADIOLOGY REPORT*  Clinical Data:  Fever.  Headache.  COMPLETE ABDOMINAL ULTRASOUND  Comparison:  None.  Findings:  Gallbladder:  Cholelithiasis is present.  Gallbladder wall measures 3 mm, normal.  No sonographic Murphy's sign.  No pericholecystic fluid.  Largest stone measures 12 mm.  Common bile duct:  Mildly enlarged for age.  No common duct stone.  Liver:  No intrahepatic biliary ductal dilation.  No mass lesion. Normal echotexture.  IVC:  Appears normal.  Pancreas:  No focal abnormality seen.  Spleen:  11.9 cm.  Normal echotexture.  Right Kidney:  11.9 cm. Normal echotexture.  Normal central sinus echo complex.  No calculi or hydronephrosis.  Left Kidney:  11.5 cm.  Normal central sinus echo complex.  1 cm cystic lesion is  present in the lower pole.  This has internal echoes and no definite increased through transmission.  This may represent a debris filled cyst.  By ultrasound, this is indeterminant.  Abdominal aorta:  Bifurcation obscured by bowel gas.  No aneurysm.  IMPRESSION: 1.  Cholelithiasis without cholecystitis. 2.  Mild dilation of the common bile duct for age.  This is nonspecific, most commonly associated with prior passage of stone. Correlation with bilirubin recommended. No common duct stone identified. 3.  Echogenic 1 cm cystic lesion in the left inferior renal pole is indeterminant.  57-month follow-up renal ultrasound is recommended in a patient without history of malignancy.  This likely represents a hemorrhagic cyst or debris filled cyst.  Original Report Authenticated By: Andreas Newport, M.D.   Ct Abdomen Pelvis W Contrast  09/06/2011  *RADIOLOGY REPORT*  Clinical Data: Fever and abdominal pain.  Headache.  Chills.  Right upper quadrant discomfort.  CT ABDOMEN AND PELVIS WITH CONTRAST  Technique:  Multidetector CT imaging of the abdomen and pelvis was performed following the standard protocol during bolus administration of intravenous contrast.  Contrast: OMNIPAQUE IOHEXOL 300 MG/ML  SOLN  Comparison: None.  Findings: Consolidation is present in the posterior left lower lobe.  Dependent atelectasis in the right lower lobe.  Spleen appears normal.  Cholelithiasis is present with a small amount of gallbladder wall thickening and pericholecystic fluid. The findings are suspicious for acute cholecystitis.  Correlating with prior ultrasound, the pericholecystic fluid was not evident on the ultrasound.  Adrenal glands normal.  Left renal cystic lesions are present. One of these is too small to characterize.  The other demonstrates a few septations and is difficult to fully characterize on CT because of its small size of 11 mm.  The smaller lesion nearby is too small to characterize.  Normal delayed excretion of  contrast in the kidneys.  Normal appendix.  Prominent stool burden in the ascending colon.  Small bowel is normal.  Hysterectomy.  Urinary bladder normal.  No free fluid.  Vasculature appears within normal limits.  No aggressive osseous lesions are present.  There is hyperostosis of the right iliac wing.  The appearance suggests a benign etiology such as old fibrous dysplasia or Paget's disease.  There is no cortical thickening.  Old trauma could produce a similar appearance.  There is no periosteal reaction or soft tissue mass.  IMPRESSION: 1.  CT evidence of acute cholecystitis.  Cholelithiasis with pericholecystic fluid.  The absence of the sonographic Murphy's sign on the prior exam is confusing.  Possibly this could be related to narcotic administration prior to ultrasound examination. 2.  Left lower lobe consolidation.  Differential considerations are pneumonia or aspiration pneumonitis. 3.  Benign appearing hyperostosis of the right iliac wing.  The origin of this is unclear and could relate to old trauma, old fibrous dysplasia. 4. Bosniak IIF left interpolar renal cyst measuring 11 mm. 48-month follow-up ultrasound recommended.  Original Report Authenticated By: Andreas Newport, M.D.     No diagnosis found.    MDM  Discuss CT results with Gen. surgery. We'll start Rocephin and Zithromax IV for potential pneumonia. Surgeon will see patient in CDU        Donnetta Hutching, MD 09/06/11 [phone]

## 2011-09-06 NOTE — H&P (Signed)
Michelle Contreras is an 51 y.o. female.   Chief Complaint: abdominal pain, fever consult from Dr. Adriana Simas HPI:  82 yof who is otherwise healthy who presents since Friday am with ruq/epigastric abdominal pain radiating to her back.  No prior history.  Nausea but no emesis.  She reports fevers since it started at home.  Nothing making it better and exam makes worse.  Past Medical History  Diagnosis Date  . HSV (herpes simplex virus) anogenital infection     Hx of  . Sarcoma     Lower Extremitiy  . GERD (gastroesophageal reflux disease)     Past Surgical History  Procedure Date  . Amputation of lower limb     left leg, below knee  . Abdominal hysterectomy   . Neck surgery   . Removal of benign growth to r breast     Family History  Problem Relation Age of Onset  . Breast cancer Father   . Colon cancer Father   . Colon polyps Father   . Heart disease Father     ?  . Kidney cancer Paternal Aunt   . Diabetes Mother   . Kidney disease Mother     currently on dialysis  . Diabetes Maternal Grandfather   . Colon polyps Brother    Social History:  reports that she has quit smoking. She does not have any smokeless tobacco history on file. She reports that she drinks about .5 ounces of alcohol per week. She reports that she does not use illicit drugs.  Allergies: No Known Allergies   (Not in a hospital admission)  Results for orders placed during the hospital encounter of 09/06/11 (from the past 48 hour(s))  CBC     Status: Normal   Collection Time   09/06/11 11:28 AM      Component Value Range Comment   WBC 8.0  4.0 - 10.5 K/uL    RBC 4.23  3.87 - 5.11 MIL/uL    Hemoglobin 12.6  12.0 - 15.0 g/dL    HCT 78.2  95.6 - 21.3 %    MCV 87.9  78.0 - 100.0 fL    MCH 29.8  26.0 - 34.0 pg    MCHC 33.9  30.0 - 36.0 g/dL    RDW 08.6  57.8 - 46.9 %    Platelets 158  150 - 400 K/uL   POCT I-STAT, CHEM 8     Status: Abnormal   Collection Time   09/06/11 11:57 AM      Component Value Range  Comment   Sodium 143  135 - 145 mEq/L    Potassium 4.4  3.5 - 5.1 mEq/L    Chloride 103  96 - 112 mEq/L    BUN 15  6 - 23 mg/dL    Creatinine, Ser 6.29  0.50 - 1.10 mg/dL    Glucose, Bld 528 (*) 70 - 99 mg/dL    Calcium, Ion 4.13  2.44 - 1.32 mmol/L    TCO2 26  0 - 100 mmol/L    Hemoglobin 12.6  12.0 - 15.0 g/dL    HCT 01.0  27.2 - 53.6 %   COMPREHENSIVE METABOLIC PANEL     Status: Abnormal   Collection Time   09/06/11 12:17 PM      Component Value Range Comment   Sodium 141  135 - 145 mEq/L    Potassium 4.1  3.5 - 5.1 mEq/L    Chloride 103  96 - 112 mEq/L  CO2 27  19 - 32 mEq/L    Glucose, Bld 115 (*) 70 - 99 mg/dL    BUN 15  6 - 23 mg/dL    Creatinine, Ser 7.82  0.50 - 1.10 mg/dL    Calcium 9.4  8.4 - 95.6 mg/dL    Total Protein 7.3  6.0 - 8.3 g/dL    Albumin 3.7  3.5 - 5.2 g/dL    AST 21  0 - 37 U/L    ALT 29  0 - 35 U/L    Alkaline Phosphatase 67  39 - 117 U/L    Total Bilirubin 0.6  0.3 - 1.2 mg/dL    GFR calc non Af Amer 66 (*) >90 mL/min    GFR calc Af Amer 77 (*) >90 mL/min   LIPASE, BLOOD     Status: Normal   Collection Time   09/06/11 12:17 PM      Component Value Range Comment   Lipase 29  11 - 59 U/L   URINALYSIS, ROUTINE W REFLEX MICROSCOPIC     Status: Abnormal   Collection Time   09/06/11  2:33 PM      Component Value Range Comment   Color, Urine YELLOW  YELLOW    APPearance CLEAR  CLEAR    Specific Gravity, Urine 1.020  1.005 - 1.030    pH 7.0  5.0 - 8.0    Glucose, UA NEGATIVE  NEGATIVE mg/dL    Hgb urine dipstick TRACE (*) NEGATIVE    Bilirubin Urine NEGATIVE  NEGATIVE    Ketones, ur 15 (*) NEGATIVE mg/dL    Protein, ur NEGATIVE  NEGATIVE mg/dL    Urobilinogen, UA 1.0  0.0 - 1.0 mg/dL    Nitrite NEGATIVE  NEGATIVE    Leukocytes, UA TRACE (*) NEGATIVE   URINE MICROSCOPIC-ADD ON     Status: Abnormal   Collection Time   09/06/11  2:33 PM      Component Value Range Comment   Squamous Epithelial / LPF FEW (*) RARE    WBC, UA 0-2  <3 WBC/hpf    RBC  / HPF 0-2  <3 RBC/hpf    US Abdomen Complete  09/06/2011  *RADIOLOGY REPORT*  Clinical Data:  Fever.  Headache.  COMPLETE ABDOMINAL ULTRASOUND  Comparison:  None.  Findings:  Gallbladder:  Cholelithiasis is present.  Gallbladder wall measures 3 mm, normal.  No sonographic Murphy's sign.  No pericholecystic fluid.  Largest stone measures 12 mm.  Common bile duct:  Mildly enlarged for age.  No common duct stone.  Liver:  No intrahepatic biliary ductal dilation.  No mass lesion. Normal echotexture.  IVC:  Appears normal.  Pancreas:  No focal abnormality seen.  Spleen:  11.9 cm.  Normal echotexture.  Right Kidney:  11.9 cm. Normal echotexture.  Normal central sinus echo complex.  No calculi or hydronephrosis.  Left Kidney:  11.5 cm.  Normal central sinus echo complex.  1 cm cystic lesion is present in the lower pole.  This has internal echoes and no definite increased through transmission.  This may represent a debris filled cyst.  By ultrasound, this is indeterminant.  Abdominal aorta:  Bifurcation obscured by bowel gas.  No aneurysm.  IMPRESSION: 1.  Cholelithiasis without cholecystitis. 2.  Mild dilation of the common bile duct for age.  This is nonspecific, most commonly associated with prior passage of stone. Correlation with bilirubin recommended. No common duct stone identified. 3.  Echogenic 1 cm cystic lesion in the left inferior  renal pole is indeterminant.  51-month follow-up renal ultrasound is recommended in a patient without history of malignancy.  This likely represents a hemorrhagic cyst or debris filled cyst.  Original Report Authenticated By: Andreas Newport, M.D.   Ct Abdomen Pelvis W Contrast  09/06/2011  *RADIOLOGY REPORT*  Clinical Data: Fever and abdominal pain.  Headache.  Chills.  Right upper quadrant discomfort.  CT ABDOMEN AND PELVIS WITH CONTRAST  Technique:  Multidetector CT imaging of the abdomen and pelvis was performed following the standard protocol during bolus administration of  intravenous contrast.  Contrast: OMNIPAQUE IOHEXOL 300 MG/ML  SOLN  Comparison: None.  Findings: Consolidation is present in the posterior left lower lobe.  Dependent atelectasis in the right lower lobe.  Spleen appears normal.  Cholelithiasis is present with a small amount of gallbladder wall thickening and pericholecystic fluid. The findings are suspicious for acute cholecystitis.  Correlating with prior ultrasound, the pericholecystic fluid was not evident on the ultrasound.  Adrenal glands normal.  Left renal cystic lesions are present. One of these is too small to characterize.  The other demonstrates a few septations and is difficult to fully characterize on CT because of its small size of 11 mm.  The smaller lesion nearby is too small to characterize.  Normal delayed excretion of contrast in the kidneys.  Normal appendix.  Prominent stool burden in the ascending colon.  Small bowel is normal.  Hysterectomy.  Urinary bladder normal.  No free fluid.  Vasculature appears within normal limits.  No aggressive osseous lesions are present.  There is hyperostosis of the right iliac wing.  The appearance suggests a benign etiology such as old fibrous dysplasia or Paget's disease.  There is no cortical thickening.  Old trauma could produce a similar appearance.  There is no periosteal reaction or soft tissue mass.  IMPRESSION: 1.  CT evidence of acute cholecystitis.  Cholelithiasis with pericholecystic fluid.  The absence of the sonographic Murphy's sign on the prior exam is confusing.  Possibly this could be related to narcotic administration prior to ultrasound examination. 2.  Left lower lobe consolidation.  Differential considerations are pneumonia or aspiration pneumonitis. 3.  Benign appearing hyperostosis of the right iliac wing.  The origin of this is unclear and could relate to old trauma, old fibrous dysplasia. 4. Bosniak IIF left interpolar renal cyst measuring 11 mm. 80-month follow-up ultrasound  recommended.  Original Report Authenticated By: Andreas Newport, M.D.    Review of Systems  Constitutional: Positive for fever and chills.  Gastrointestinal: Positive for nausea and abdominal pain. Negative for vomiting and diarrhea.    Blood pressure 116/66, pulse 94, temperature 99.3 F (37.4 C), temperature source Oral, resp. rate 14, SpO2 97.00%. Physical Exam  Vitals reviewed. Constitutional: She appears well-developed and well-nourished.  Eyes: No scleral icterus.  Cardiovascular: Normal rate, regular rhythm and normal heart sounds.   Respiratory: Effort normal and breath sounds normal. She has no wheezes. She has no rales.  GI: Soft. Bowel sounds are normal. There is tenderness in the right upper quadrant. There is positive Murphy's sign.       Assessment/Plan Acute cholecystitis Will admit overnight for abx and cholecystectomy in am with Dr. Jamey Ripa I discussed the procedure in detail.   We discussed the risks and benefits of a laparoscopic cholecystectomy and possible cholangiogram including, but not limited to bleeding, infection, injury to surrounding structures such as the intestine or liver, bile leak, retained gallstones, need to convert to an open procedure.Marland Kitchen  The likelihood of improvement in symptoms and return to the patient's normal status is good. We discussed the typical post-operative recovery course. She clinically does not appear to have a pna.  Deontre Allsup 09/06/2011, 7:29 PM

## 2011-09-06 NOTE — ED Notes (Signed)
Pt reports fever since Thursday night. Reports severe headache and chills associated. Reports right upper quadrant discomfort, described as gas pain.

## 2011-09-06 NOTE — ED Provider Notes (Signed)
History     CSN: [id number]  Arrival date & time 09/06/11  1112   First MD Initiated Contact with Patient 09/06/11 1209      Chief Complaint  Patient presents with  . Fever  . Headache    (Consider location/radiation/quality/duration/timing/severity/associated sxs/prior treatment) HPI Comments: Pt with some upper abdominal pain begginning a few days ago, then developed fevers about 2.5 days ago, couldn't keep it well controlled despite taking ibuprofen.  She hasn't been able to eat much or drink much due to persistent nausea, no vomiting, no diarrhea.  She thinks she has lost weight.  She denies sinus problems, cold symptoms, CP or cough, no dysuria.  She developed a diffuse HA today, but not neck pain or stiffness, no rash.  She has had hysterectomy in the past.  Also cancer in leg requiring amputation years ago.  Denies SOB.    Patient is a 51 y.o. female presenting with fever and headaches. The history is provided by the patient.  Fever Primary symptoms of the febrile illness include fever, fatigue, headaches, abdominal pain and nausea. Primary symptoms do not include shortness of breath, vomiting, diarrhea, dysuria or rash.  The headache is not associated with neck stiffness or weakness.  Headache  Associated symptoms include a fever and nausea. Pertinent negatives include no shortness of breath and no vomiting.    Past Medical History  Diagnosis Date  . HSV (herpes simplex virus) anogenital infection     Hx of  . Sarcoma     Lower Extremitiy  . GERD (gastroesophageal reflux disease)     Past Surgical History  Procedure Date  . Amputation of lower limb     left leg, below knee  . Abdominal hysterectomy   . Neck surgery   . Removal of benign growth to r breast     Family History  Problem Relation Age of Onset  . Breast cancer Father   . Colon cancer Father   . Colon polyps Father   . Heart disease Father     ?  . Kidney cancer Paternal Aunt   . Diabetes Mother     . Kidney disease Mother     currently on dialysis  . Diabetes Maternal Grandfather   . Colon polyps Brother     History  Substance Use Topics  . Smoking status: Former Games developer  . Smokeless tobacco: Not on file   Comment: smoked from age [age]. no longer smokes  . Alcohol Use: 0.5 oz/week    1 drink(s) per week     occasional-weekends    OB History    Grav Para Term Preterm Abortions TAB SAB Ect Mult Living                  Review of Systems  Constitutional: Positive for fever, chills, appetite change and fatigue.  HENT: Negative for congestion, rhinorrhea, trouble swallowing, neck pain, neck stiffness and sinus pressure.   Respiratory: Negative for chest tightness and shortness of breath.   Cardiovascular: Negative for chest pain.  Gastrointestinal: Positive for nausea and abdominal pain. Negative for vomiting, diarrhea and blood in stool.  Genitourinary: Negative for dysuria and flank pain.  Musculoskeletal: Negative for back pain.  Skin: Negative for rash.  Neurological: Positive for headaches. Negative for weakness and numbness.  All other systems reviewed and are negative.    Allergies  Review of patient's allergies indicates no known allergies.  Home Medications   Current Outpatient Rx  Name Route Sig Dispense Refill  .  B COMPLEX VITAMINS PO CAPS Oral Take 1 capsule by mouth daily.      Marland Kitchen VITAMIN D-3 PO Oral Take 1,000 mg by mouth. Take 1 capsule by mouth once daily    . ESTRADIOL 0.5 MG PO TABS Oral Take 0.5 mg by mouth daily.      Marland Kitchen HYDROCODONE-ACETAMINOPHEN 5-325 MG PO TABS Oral Take 1 tablet by mouth every 6 (six) hours as needed. For pain    . IBUPROFEN 200 MG PO TABS Oral Take 200 mg by mouth every 6 (six) hours as needed. For pain    . LIDODERM 5 % EX PTCH Transdermal Place 1 patch onto the skin daily.     Marland Kitchen ONE-DAILY MULTI VITAMINS PO TABS Oral Take 1 tablet by mouth daily.      . SUMATRIPTAN SUCCINATE 100 MG PO TABS        BP 124/64  Pulse 111   Temp 103 F (39.4 C) (Oral)  Resp 20  SpO2 98%  Physical Exam  Nursing note and vitals reviewed. Constitutional: She is oriented to person, place, and time. She appears well-developed and well-nourished.  Non-toxic appearance. She appears ill. No distress.  HENT:  Head: Normocephalic and atraumatic.  Mouth/Throat: Uvula is midline and mucous membranes are normal.  Neck: Trachea normal, normal range of motion and full passive range of motion without pain. Neck supple. No rigidity. Normal range of motion present. No Brudzinski's sign and no Kernig's sign noted.  Cardiovascular: Regular rhythm and normal heart sounds.  Tachycardia present.   No murmur heard. Pulmonary/Chest: Effort normal and breath sounds normal. She has no decreased breath sounds.  Abdominal: Soft. She exhibits no distension. There is tenderness. There is rebound and guarding.  Musculoskeletal: She exhibits no edema.  Neurological: She is alert and oriented to person, place, and time.  Skin: Skin is warm and dry. No rash noted. She is not diaphoretic. No pallor.  Psychiatric: She has a normal mood and affect.    ED Course  Procedures (including critical care time) RA sat is 98% and normal by my interpretation.  Labs Reviewed  POCT I-STAT, CHEM 8 - Abnormal; Notable for the following:    Glucose, Bld 120 (*)     All other components within normal limits  COMPREHENSIVE METABOLIC PANEL - Abnormal; Notable for the following:    Glucose, Bld 115 (*)     GFR calc non Af Amer 66 (*)     GFR calc Af Amer 77 (*)     All other components within normal limits  CBC  LIPASE, BLOOD  URINALYSIS, ROUTINE W REFLEX MICROSCOPIC  CULTURE, BLOOD (ROUTINE X 2)  CULTURE, BLOOD (ROUTINE X 2)  URINE CULTURE   US Abdomen Complete  09/06/2011  *RADIOLOGY REPORT*  Clinical Data:  Fever.  Headache.  COMPLETE ABDOMINAL ULTRASOUND  Comparison:  None.  Findings:  Gallbladder:  Cholelithiasis is present.  Gallbladder wall measures 3 mm,  normal.  No sonographic Murphy's sign.  No pericholecystic fluid.  Largest stone measures 12 mm.  Common bile duct:  Mildly enlarged for age.  No common duct stone.  Liver:  No intrahepatic biliary ductal dilation.  No mass lesion. Normal echotexture.  IVC:  Appears normal.  Pancreas:  No focal abnormality seen.  Spleen:  11.9 cm.  Normal echotexture.  Right Kidney:  11.9 cm. Normal echotexture.  Normal central sinus echo complex.  No calculi or hydronephrosis.  Left Kidney:  11.5 cm.  Normal central sinus echo complex.  1  cm cystic lesion is present in the lower pole.  This has internal echoes and no definite increased through transmission.  This may represent a debris filled cyst.  By ultrasound, this is indeterminant.  Abdominal aorta:  Bifurcation obscured by bowel gas.  No aneurysm.  IMPRESSION: 1.  Cholelithiasis without cholecystitis. 2.  Mild dilation of the common bile duct for age.  This is nonspecific, most commonly associated with prior passage of stone. Correlation with bilirubin recommended. No common duct stone identified. 3.  Echogenic 1 cm cystic lesion in the left inferior renal pole is indeterminant.  24-month follow-up renal ultrasound is recommended in a patient without history of malignancy.  This likely represents a hemorrhagic cyst or debris filled cyst.  Original Report Authenticated By: Andreas Newport, M.D.   I reviewed the above U/S and the radiologist interpretation.  No diagnosis found.  2:50 PM Pt's abd pain improved after dilaudid.  HA persists.  Pt given tylenol, IV reglan and benadryl as a migraine cocktail.  U/S shows stones, no wall thickening but some CBD thickening.  I discussed with general surgery who requested a CT of abd/pelvis and then to contact him back.  He doesn't feel that a fever of 103 is generally caused by cholecystitis.     4:30 PM Pt is signed out to Dr. Adriana Simas to monitor patient, follow up on CT scan and discuss with general surgery once results are back.     MDM   1:38 PM I reviewed U/S and interpret that there are gallstones multiple present, no wall thickening at present.  Pt with guarding in RUQ, fever.  Blood cultures obtained, will need surgical consult in my opinion.  Pain and nausea meds ordered, pt made NPO except meds.          Gavin Pound. Sequita Wise, MD 09/07/11 1544

## 2011-09-06 NOTE — Progress Notes (Signed)
ANTIBIOTIC CONSULT NOTE - INITIAL  Pharmacy Consult:  Zosyn Indication:  Cholecystitis  No Known Allergies  Patient Measurements: 08/2010 Weight = 88 kg 08/2010 Height = 178 cm  Vital Signs: Temp: 99.3 F (37.4 C) (06/16 1715) Temp src: Oral (06/16 1715) BP: 116/66 mmHg (06/16 1715) Pulse Rate: 94  (06/16 1715)  Labs:  Basename 09/06/11 1217 09/06/11 1157 09/06/11 1128  WBC -- -- 8.0  HGB -- 12.6 12.6  PLT -- -- 158  LABCREA -- -- --  CREATININE 0.97 1.10 --   The CrCl is unknown because both a height and weight (above a minimum accepted value) are required for this calculation. No results found for this basename: VANCOTROUGH:2,VANCOPEAK:2,VANCORANDOM:2,GENTTROUGH:2,GENTPEAK:2,GENTRANDOM:2,TOBRATROUGH:2,TOBRAPEAK:2,TOBRARND:2,AMIKACINPEAK:2,AMIKACINTROU:2,AMIKACIN:2, in the last 72 hours   Microbiology: No results found for this or any previous visit (from the past 720 hour(s)).  Medical History: Past Medical History  Diagnosis Date  . HSV (herpes simplex virus) anogenital infection     Hx of  . Sarcoma     Lower Extremitiy  . GERD (gastroesophageal reflux disease)         Assessment: 89 YOF presented with RUQ/epigastric abd pain to start Zosyn empirically for cholecystitis.     Plan:  - Zosyn 3.375gm IV Q8H, 4 hr infusion - F/U updated weight - Monitor renal fxn, clinical course    Natajah Derderian D. Laney Potash, PharmD, BCPS Pager:  [phone] 09/06/2011, 8:27 PM

## 2011-09-07 ENCOUNTER — Inpatient Hospital Stay (HOSPITAL_COMMUNITY): Payer: Managed Care, Other (non HMO)

## 2011-09-07 ENCOUNTER — Encounter (HOSPITAL_COMMUNITY): Payer: Self-pay | Admitting: Anesthesiology

## 2011-09-07 ENCOUNTER — Encounter (HOSPITAL_COMMUNITY): Admission: EM | Disposition: A | Discharge status: Home / Self Care | Payer: Self-pay | Source: Home / Self Care

## 2011-09-07 ENCOUNTER — Inpatient Hospital Stay (HOSPITAL_COMMUNITY): Payer: Managed Care, Other (non HMO) | Admitting: Anesthesiology

## 2011-09-07 DIAGNOSIS — K810 Acute cholecystitis: Secondary | ICD-10-CM | POA: Diagnosis present

## 2011-09-07 HISTORY — PX: CHOLECYSTECTOMY: SHX55

## 2011-09-07 LAB — COMPREHENSIVE METABOLIC PANEL
ALT: 22 U/L (ref 0–35)
AST: 17 U/L (ref 0–37)
Calcium: 8.1 mg/dL — ABNORMAL LOW (ref 8.4–10.5)
Creatinine, Ser: 0.93 mg/dL (ref 0.50–1.10)
Sodium: 139 mEq/L (ref 135–145)
Total Protein: 5.9 g/dL — ABNORMAL LOW (ref 6.0–8.3)

## 2011-09-07 LAB — SURGICAL PCR SCREEN: Staphylococcus aureus: NEGATIVE

## 2011-09-07 LAB — CBC
HCT: 31.4 % — ABNORMAL LOW (ref 36.0–46.0)
Hemoglobin: 10.7 g/dL — ABNORMAL LOW (ref 12.0–15.0)
MCH: 29.6 pg (ref 26.0–34.0)
MCH: 29.1 pg (ref 26.0–34.0)
MCHC: 34.1 g/dL (ref 30.0–36.0)
MCHC: 33.9 g/dL (ref 30.0–36.0)
MCV: 85.8 fL (ref 78.0–100.0)
Platelets: 135 10*3/uL — ABNORMAL LOW (ref 150–400)
RBC: 3.62 MIL/uL — ABNORMAL LOW (ref 3.87–5.11)

## 2011-09-07 LAB — CREATININE, SERUM: Creatinine, Ser: 0.87 mg/dL (ref 0.50–1.10)

## 2011-09-07 SURGERY — LAPAROSCOPIC CHOLECYSTECTOMY WITH INTRAOPERATIVE CHOLANGIOGRAM
Anesthesia: General | Site: Abdomen | Wound class: Clean Contaminated

## 2011-09-07 MED ORDER — DROPERIDOL 2.5 MG/ML IJ SOLN
INTRAMUSCULAR | Status: DC | PRN
  Administered 2011-09-07: 0.625 mg via INTRAVENOUS

## 2011-09-07 MED ORDER — BUPIVACAINE HCL (PF) 0.25 % IJ SOLN
INTRAMUSCULAR | Status: DC | PRN
  Administered 2011-09-07: 30 mL

## 2011-09-07 MED ORDER — PROMETHAZINE HCL 25 MG/ML IJ SOLN: 6.2500 mg | INTRAMUSCULAR | Status: DC | PRN

## 2011-09-07 MED ORDER — MIDAZOLAM HCL 5 MG/5ML IJ SOLN
INTRAMUSCULAR | Status: DC | PRN
  Administered 2011-09-07: 2 mg via INTRAVENOUS

## 2011-09-07 MED ORDER — LACTATED RINGERS IV SOLN
INTRAVENOUS | Status: DC
  Administered 2011-09-07: 10:00:00 via INTRAVENOUS

## 2011-09-07 MED ORDER — LIDOCAINE HCL (CARDIAC) 20 MG/ML IV SOLN
INTRAVENOUS | Status: DC | PRN
  Administered 2011-09-07: 100 mg via INTRAVENOUS

## 2011-09-07 MED ORDER — FENTANYL CITRATE 0.05 MG/ML IJ SOLN
INTRAMUSCULAR | Status: DC | PRN
Start: 2011-09-07 — End: 2011-09-07
  Administered 2011-09-07: 100 ug via INTRAVENOUS

## 2011-09-07 MED ORDER — SODIUM CHLORIDE 0.9 % IV SOLN
INTRAVENOUS | Status: DC | PRN
  Administered 2011-09-07: 11:00:00

## 2011-09-07 MED ORDER — SODIUM CHLORIDE 0.9 % IR SOLN
Status: DC | PRN
  Administered 2011-09-07: 1000 mL

## 2011-09-07 MED ORDER — BUPIVACAINE HCL (PF) 0.25 % IJ SOLN
INTRAMUSCULAR | Status: AC
  Filled 2011-09-07: qty 30

## 2011-09-07 MED ORDER — ONDANSETRON HCL 4 MG/2ML IJ SOLN: 4.0000 mg | Freq: Four times a day (QID) | INTRAMUSCULAR | Status: DC | PRN

## 2011-09-07 MED ORDER — HYDROMORPHONE HCL PF 1 MG/ML IJ SOLN
2.0000 mg | INTRAMUSCULAR | Status: DC | PRN
  Administered 2011-09-07 – 2011-09-08 (×2): 2 mg via INTRAVENOUS
  Filled 2011-09-07 (×2): qty 2

## 2011-09-07 MED ORDER — NEOSTIGMINE METHYLSULFATE 1 MG/ML IJ SOLN
INTRAMUSCULAR | Status: DC | PRN
  Administered 2011-09-07: 4 mg via INTRAVENOUS

## 2011-09-07 MED ORDER — GLYCOPYRROLATE 0.2 MG/ML IJ SOLN
INTRAMUSCULAR | Status: DC | PRN
  Administered 2011-09-07: .7 mg via INTRAVENOUS

## 2011-09-07 MED ORDER — BIOTENE DRY MOUTH MT LIQD: 15.0000 mL | Freq: Two times a day (BID) | OROMUCOSAL | Status: DC

## 2011-09-07 MED ORDER — SUCCINYLCHOLINE CHLORIDE 20 MG/ML IJ SOLN
INTRAMUSCULAR | Status: DC | PRN
  Administered 2011-09-07: 100 mg via INTRAVENOUS

## 2011-09-07 MED ORDER — HETASTARCH-ELECTROLYTES 6 % IV SOLN
INTRAVENOUS | Status: DC | PRN
  Administered 2011-09-07: 11:00:00 via INTRAVENOUS

## 2011-09-07 MED ORDER — MEPERIDINE HCL 25 MG/ML IJ SOLN: 6.2500 mg | INTRAMUSCULAR | Status: DC | PRN

## 2011-09-07 MED ORDER — HEPARIN SODIUM (PORCINE) 5000 UNIT/ML IJ SOLN
5000.0000 [IU] | Freq: Three times a day (TID) | INTRAMUSCULAR | Status: DC
  Administered 2011-09-08: 5000 [IU] via SUBCUTANEOUS
  Filled 2011-09-07 (×4): qty 1

## 2011-09-07 MED ORDER — ROCURONIUM BROMIDE 100 MG/10ML IV SOLN
INTRAVENOUS | Status: DC | PRN
  Administered 2011-09-07: 20 mg via INTRAVENOUS
  Administered 2011-09-07: 30 mg via INTRAVENOUS

## 2011-09-07 MED ORDER — HYDROMORPHONE HCL PF 1 MG/ML IJ SOLN: 0.2500 mg | INTRAMUSCULAR | Status: DC | PRN

## 2011-09-07 MED ORDER — LACTATED RINGERS IV SOLN
INTRAVENOUS | Status: DC | PRN
  Administered 2011-09-07 (×2): via INTRAVENOUS

## 2011-09-07 MED ORDER — DEXAMETHASONE SODIUM PHOSPHATE 4 MG/ML IJ SOLN
INTRAMUSCULAR | Status: DC | PRN
  Administered 2011-09-07: 8 mg via INTRAVENOUS

## 2011-09-07 MED ORDER — ONDANSETRON HCL 4 MG/2ML IJ SOLN
INTRAMUSCULAR | Status: DC | PRN
  Administered 2011-09-07 (×2): 4 mg via INTRAVENOUS

## 2011-09-07 MED ORDER — CHLORHEXIDINE GLUCONATE 0.12 % MT SOLN
15.0000 mL | Freq: Two times a day (BID) | OROMUCOSAL | Status: DC
  Administered 2011-09-07: 15 mL via OROMUCOSAL
  Filled 2011-09-07: qty 15

## 2011-09-07 MED ORDER — ONDANSETRON HCL 4 MG PO TABS: 4.0000 mg | ORAL_TABLET | Freq: Four times a day (QID) | ORAL | Status: DC | PRN

## 2011-09-07 MED ORDER — PROPOFOL 10 MG/ML IV EMUL
INTRAVENOUS | Status: DC | PRN
  Administered 2011-09-07: 170 mg via INTRAVENOUS

## 2011-09-07 MED ORDER — OXYCODONE-ACETAMINOPHEN 5-325 MG PO TABS
1.0000 | ORAL_TABLET | ORAL | Status: DC | PRN
  Administered 2011-09-07: 2 via ORAL
  Administered 2011-09-08: 1 via ORAL
  Filled 2011-09-07 (×3): qty 2
  Filled 2011-09-07: qty 1

## 2011-09-07 SURGICAL SUPPLY — 46 items
APPLIER CLIP 5 13 M/L LIGAMAX5 (MISCELLANEOUS) ×2
APPLIER CLIP ROT 10 11.4 M/L (STAPLE)
BLADE SURG ROTATE 9660 (MISCELLANEOUS) IMPLANT
CANISTER SUCTION 2500CC (MISCELLANEOUS) ×2 IMPLANT
CHLORAPREP W/TINT 26ML (MISCELLANEOUS) ×2 IMPLANT
CLIP APPLIE 5 13 M/L LIGAMAX5 (MISCELLANEOUS) ×1 IMPLANT
CLIP APPLIE ROT 10 11.4 M/L (STAPLE) IMPLANT
CLOTH BEACON ORANGE TIMEOUT ST (SAFETY) ×2 IMPLANT
COVER MAYO STAND STRL (DRAPES) IMPLANT
COVER SURGICAL LIGHT HANDLE (MISCELLANEOUS) ×2 IMPLANT
DECANTER SPIKE VIAL GLASS SM (MISCELLANEOUS) IMPLANT
DERMABOND ADVANCED (GAUZE/BANDAGES/DRESSINGS) ×1
DERMABOND ADVANCED .7 DNX12 (GAUZE/BANDAGES/DRESSINGS) ×1 IMPLANT
DRAPE C-ARM 42X72 X-RAY (DRAPES) ×2 IMPLANT
DRAPE UTILITY 15X26 W/TAPE STR (DRAPE) ×4 IMPLANT
ELECT REM PT RETURN 9FT ADLT (ELECTROSURGICAL) ×2
ELECTRODE REM PT RTRN 9FT ADLT (ELECTROSURGICAL) ×1 IMPLANT
FILTER SMOKE EVAC LAPAROSHD (FILTER) IMPLANT
GLOVE BIO SURGEON STRL SZ 6.5 (GLOVE) ×2 IMPLANT
GLOVE BIOGEL PI IND STRL 6 (GLOVE) ×1 IMPLANT
GLOVE BIOGEL PI IND STRL 7.0 (GLOVE) ×2 IMPLANT
GLOVE BIOGEL PI INDICATOR 6 (GLOVE) ×1
GLOVE BIOGEL PI INDICATOR 7.0 (GLOVE) ×2
GLOVE ECLIPSE 6.5 STRL STRAW (GLOVE) ×2 IMPLANT
GLOVE EUDERMIC 7 POWDERFREE (GLOVE) ×2 IMPLANT
GLOVE SURG SS PI 7.0 STRL IVOR (GLOVE) ×2 IMPLANT
GOWN PREVENTION PLUS XLARGE (GOWN DISPOSABLE) ×2 IMPLANT
GOWN STRL NON-REIN LRG LVL3 (GOWN DISPOSABLE) ×6 IMPLANT
KIT BASIN OR (CUSTOM PROCEDURE TRAY) ×2 IMPLANT
KIT ROOM TURNOVER OR (KITS) ×2 IMPLANT
NS IRRIG 1000ML POUR BTL (IV SOLUTION) ×2 IMPLANT
PAD ARMBOARD 7.5X6 YLW CONV (MISCELLANEOUS) ×2 IMPLANT
POUCH SPECIMEN RETRIEVAL 10MM (ENDOMECHANICALS) ×2 IMPLANT
SCISSORS LAP 5X35 DISP (ENDOMECHANICALS) ×2 IMPLANT
SET CHOLANGIOGRAPH 5 50 .035 (SET/KITS/TRAYS/PACK) IMPLANT
SET IRRIG TUBING LAPAROSCOPIC (IRRIGATION / IRRIGATOR) ×2 IMPLANT
SLEEVE Z-THREAD 5X100MM (TROCAR) ×2 IMPLANT
SPECIMEN JAR SMALL (MISCELLANEOUS) ×2 IMPLANT
SUT MNCRL AB 4-0 PS2 18 (SUTURE) ×4 IMPLANT
TOWEL OR 17X24 6PK STRL BLUE (TOWEL DISPOSABLE) ×2 IMPLANT
TOWEL OR 17X26 10 PK STRL BLUE (TOWEL DISPOSABLE) ×2 IMPLANT
TRAY LAPAROSCOPIC (CUSTOM PROCEDURE TRAY) ×2 IMPLANT
TROCAR XCEL BLUNT TIP 100MML (ENDOMECHANICALS) ×2 IMPLANT
TROCAR Z-THREAD FIOS 11X100 BL (TROCAR) ×2 IMPLANT
TROCAR Z-THREAD FIOS 5X100MM (TROCAR) ×2 IMPLANT
WATER STERILE IRR 1000ML POUR (IV SOLUTION) IMPLANT

## 2011-09-07 NOTE — Anesthesia Preprocedure Evaluation (Addendum)
Anesthesia Evaluation  Patient identified by MRN, date of birth, ID band Patient awake    Reviewed: Allergy & Precautions, H&P , NPO status , Patient's Chart, lab work & pertinent test results, reviewed documented beta blocker date and time   History of Anesthesia Complications (+) PONV  Airway Mallampati: II      Dental  (+) Teeth Intact and Dental Advisory Given   Pulmonary  breath sounds clear to auscultation  Pulmonary exam normal       Cardiovascular negative cardio ROS  Rhythm:Regular Rate:Normal     Neuro/Psych    GI/Hepatic GERD-  Controlled,  Endo/Other    Renal/GU      Musculoskeletal   Abdominal Normal abdominal exam  (+)   Peds  Hematology   Anesthesia Other Findings   Reproductive/Obstetrics                          Anesthesia Physical Anesthesia Plan  ASA: II  Anesthesia Plan: General   Post-op Pain Management:    Induction: Intravenous  Airway Management Planned: Oral ETT  Additional Equipment:   Intra-op Plan:   Post-operative Plan: Extubation in OR  Informed Consent: I have reviewed the patients History and Physical, chart, labs and discussed the procedure including the risks, benefits and alternatives for the proposed anesthesia with the patient or authorized representative who has indicated his/her understanding and acceptance.   Dental advisory given  Plan Discussed with: CRNA and Anesthesiologist  Anesthesia Plan Comments:         Anesthesia Quick Evaluation

## 2011-09-07 NOTE — Anesthesia Procedure Notes (Signed)
Procedure Name: Intubation Date/Time: 09/07/2011 10:36 AM Performed by: Garen Lah Pre-anesthesia Checklist: Patient identified, Timeout performed, Emergency Drugs available, Suction available and Patient being monitored Patient Re-evaluated:Patient Re-evaluated prior to inductionOxygen Delivery Method: Circle system utilized Preoxygenation: Pre-oxygenation with 100% oxygen Intubation Type: IV induction and Cricoid Pressure applied Laryngoscope Size: Mac and 3 Grade View: Grade II Tube type: Oral Tube size: 7.5 mm Number of attempts: 1 Airway Equipment and Method: Stylet Placement Confirmation: ETT inserted through vocal cords under direct vision,  positive ETCO2 and breath sounds checked- equal and bilateral Secured at: 22 cm Tube secured with: Tape Dental Injury: Teeth and Oropharynx as per pre-operative assessment

## 2011-09-07 NOTE — Op Note (Signed)
Michelle Contreras [facility] 06-03-1960 [id number] 09/06/2011  Preoperative diagnosis: Acute calculus cholecystitis  Postoperative diagnosis: Same  Procedure: Laparoscopic cholecystectomy with intraoperative cholangiogram  Surgeon: Currie Paris, MD, FACS  Assistant surgeon: Aggie Cosier   Anesthesia: General  Clinical History and Indications: This patient has known gallstones and comes in today for cholecystectomy.  Description of procedure: The patient was seen in the preoperative area. I reviewed the plans for the procedure with her as well as the risks and complications. She had no further questions.  The patient was taken to the operating room. After satisfactory general endotracheal anesthesia had been obtained the abdomen was prepped and draped. A time out was done.  0.25% plain Marcaine was used at all incisions. I made an umbilical incision, identified the fascia and opened that, and entered the peritoneal cavity under direct vision. A 0 Vicryl pursestring suture was placed and the Hasson cannula was introduced under direct vision and secured with the pursestring. The abdomen was inflated to 15 cm.  The camera was placed and there were no gross abnormalities. The patient was then placed in reverse Trendelenburg and tilted to the left. A 10/11 trocar was placed in the epigastrium and two 5 mm trochars placed laterally all under direct vision.  The gallbladder was distended and edematous. However there was no evidence of gangrenous changes. The gallbladder was retracted over the liver. The peritoneum overlying the cystic duct was opened and the cystic duct and cystic artery dissected out and a nice window made behind them. I could see the common duct cystic duct junction as well as the cystic duct gallbladder junction. I put a clip on the cystic artery and one on the cystic duct at its junction with the gallbladder. It was opened. A Cook catheter was placed percutaneously and threaded  in the cystic  An intraoperative cholangiogram was then performed. A Cook catheter was introduced percutaneously and placed in the cystic duct. The cholangiogram showed good filling of the common duct and hepatic radicals and free flow into the duodenum. No abnormalities were noted.  The catheter was removed and 3 clips placed on the stay side of the cystic duct. The duct was then divided.  Additional clips are placed on the cystic artery and it was divided. The gallbladder was then removed from below to above the coagulation current of the cautery. It was then placed in a bag to be retrieved later.  The abdomen was irrigated and a check for hemostasis along the bed of the gallbladder made. Once everything appeared to be dry we were able to move the camera to the epigastric port and removed the gallbladder through the umbilical port.  The abdomen was reinsufflated and a final check for hemostasis made. There is no evidence of bleeding or bile leakage. The lateral ports were removed under direct vision and there was no bleeding. The umbilical site was closed with a pursestring, watching with the camera in the epigastric port. The abdomen was then deflated through the epigastric port and that was removed. Skin was closed with 4-0 Monocryl subcuticular and Dermabond.  The patient tolerated the procedure well. There were no operative complications. EBL was minimal. All counts were correct.  Currie Paris, MD, FACS 09/07/2011 11:52 AM

## 2011-09-07 NOTE — Progress Notes (Signed)
Acute cholecystitis  Subjective: Still with RUQ pain, doesn't feel well.   Objective: Vital signs in last 24 hours: Temp:  [99.3 F (37.4 C)-103 F (39.4 C)] 101 F (38.3 C) (06/17 0622) Pulse Rate:  [87-111] 87  (06/17 0622) Resp:  [14-20] 15  (06/17 0622) BP: (103-124)/(50-69) 107/65 mmHg (06/17 0622) SpO2:  [95 %-98 %] 97 % (06/17 0622) Weight:  [174 lb 13.2 oz (79.3 kg)-194 lb 0.1 oz (88 kg)] 174 lb 13.2 oz (79.3 kg) (06/16 2127) Last BM Date: 09/04/11  Intake/Output from previous day: 06/16 0701 - 06/17 0700 In: 848.3 [I.V.:848.3] Out: 800 [Urine:800] Intake/Output this shift:    General appearance: alert, cooperative and mild distress GI: Soft with tenderness in RUQ, no rebound.  Lab Results:  Results for orders placed during the hospital encounter of 09/06/11 (from the past 24 hour(s))  CBC     Status: Normal   Collection Time   09/06/11 11:28 AM      Component Value Range   WBC 8.0  4.0 - 10.5 K/uL   RBC 4.23  3.87 - 5.11 MIL/uL   Hemoglobin 12.6  12.0 - 15.0 g/dL   HCT 40.9  81.1 - 91.4 %   MCV 87.9  78.0 - 100.0 fL   MCH 29.8  26.0 - 34.0 pg   MCHC 33.9  30.0 - 36.0 g/dL   RDW 78.2  95.6 - 21.3 %   Platelets 158  150 - 400 K/uL  POCT I-STAT, CHEM 8     Status: Abnormal   Collection Time   09/06/11 11:57 AM      Component Value Range   Sodium 143  135 - 145 mEq/L   Potassium 4.4  3.5 - 5.1 mEq/L   Chloride 103  96 - 112 mEq/L   BUN 15  6 - 23 mg/dL   Creatinine, Ser 0.86  0.50 - 1.10 mg/dL   Glucose, Bld 578 (*) 70 - 99 mg/dL   Calcium, Ion 4.69  6.29 - 1.32 mmol/L   TCO2 26  0 - 100 mmol/L   Hemoglobin 12.6  12.0 - 15.0 g/dL   HCT 52.8  41.3 - 24.4 %  COMPREHENSIVE METABOLIC PANEL     Status: Abnormal   Collection Time   09/06/11 12:17 PM      Component Value Range   Sodium 141  135 - 145 mEq/L   Potassium 4.1  3.5 - 5.1 mEq/L   Chloride 103  96 - 112 mEq/L   CO2 27  19 - 32 mEq/L   Glucose, Bld 115 (*) 70 - 99 mg/dL   BUN 15  6 - 23 mg/dL   Creatinine, Ser 0.10  0.50 - 1.10 mg/dL   Calcium 9.4  8.4 - 27.2 mg/dL   Total Protein 7.3  6.0 - 8.3 g/dL   Albumin 3.7  3.5 - 5.2 g/dL   AST 21  0 - 37 U/L   ALT 29  0 - 35 U/L   Alkaline Phosphatase 67  39 - 117 U/L   Total Bilirubin 0.6  0.3 - 1.2 mg/dL   GFR calc non Af Amer 66 (*) >90 mL/min   GFR calc Af Amer 77 (*) >90 mL/min  LIPASE, BLOOD     Status: Normal   Collection Time   09/06/11 12:17 PM      Component Value Range   Lipase 29  11 - 59 U/L  URINALYSIS, ROUTINE W REFLEX MICROSCOPIC     Status: Abnormal  Collection Time   09/06/11  2:33 PM      Component Value Range   Color, Urine YELLOW  YELLOW   APPearance CLEAR  CLEAR   Specific Gravity, Urine 1.020  1.005 - 1.030   pH 7.0  5.0 - 8.0   Glucose, UA NEGATIVE  NEGATIVE mg/dL   Hgb urine dipstick TRACE (*) NEGATIVE   Bilirubin Urine NEGATIVE  NEGATIVE   Ketones, ur 15 (*) NEGATIVE mg/dL   Protein, ur NEGATIVE  NEGATIVE mg/dL   Urobilinogen, UA 1.0  0.0 - 1.0 mg/dL   Nitrite NEGATIVE  NEGATIVE   Leukocytes, UA TRACE (*) NEGATIVE  URINE MICROSCOPIC-ADD ON     Status: Abnormal   Collection Time   09/06/11  2:33 PM      Component Value Range   Squamous Epithelial / LPF FEW (*) RARE   WBC, UA 0-2  <3 WBC/hpf   RBC / HPF 0-2  <3 RBC/hpf  SURGICAL PCR SCREEN     Status: Normal   Collection Time   09/07/11  3:04 AM      Component Value Range   MRSA, PCR NEGATIVE  NEGATIVE   Staphylococcus aureus NEGATIVE  NEGATIVE     Studies/Results Radiology     MEDS, Scheduled    . sodium chloride   Intravenous Once  . antiseptic oral rinse  15 mL Mouth Rinse q12n4p  . azithromycin  500 mg Intravenous Once  . cefTRIAXone (ROCEPHIN)  IV  1 g Intravenous Once  . chlorhexidine  15 mL Mouth Rinse BID  . diphenhydrAMINE  12.5 mg Intravenous Once  .  HYDROmorphone (DILAUDID) injection  1 mg Intravenous Once  .  HYDROmorphone (DILAUDID) injection  1 mg Intravenous Once  . iohexol  20 mL Oral Q1 Hr x 2  . metoCLOPramide  (REGLAN) injection  10 mg Intravenous Once  . ondansetron (ZOFRAN) IV  4 mg Intravenous Once  . ondansetron (ZOFRAN) IV  4 mg Intravenous Once  . ondansetron  4 mg Intravenous Once  . pantoprazole (PROTONIX) IV  40 mg Intravenous QHS  . piperacillin-tazobactam  3.375 g Intravenous Once  . piperacillin-tazobactam (ZOSYN)  IV  3.375 g Intravenous Q8H  . sodium chloride  1,000 mL Intravenous Once     Assessment: Acute cholecystitis   Plan: LC and IOC today Will need to follow pulmonary as suggestion of LLL pneumonia on CT, but no significant respiratory symptoms. Patient understands plans and risks etc of surgery, no additional questions  LOS: 1 day    Currie Paris, MD, [facility], [place] [phone]   09/07/2011 8:00 AM

## 2011-09-07 NOTE — Progress Notes (Signed)
Received from PACU post Cholecystectomy. Incision CDI, will continue to monitor.

## 2011-09-07 NOTE — Transfer of Care (Signed)
Immediate Anesthesia Transfer of Care Note  Patient: Michelle Contreras  Procedure(s) Performed: Procedure(s) (LRB): LAPAROSCOPIC CHOLECYSTECTOMY WITH INTRAOPERATIVE CHOLANGIOGRAM (N/A)  Patient Location: PACU  Anesthesia Type: General  Level of Consciousness: sedated  Airway & Oxygen Therapy: Patient Spontanous Breathing and Patient connected to nasal cannula oxygen  Post-op Assessment: Report given to PACU RN and Post -op Vital signs reviewed and stable  Post vital signs: Reviewed and stable  Complications: No apparent anesthesia complications

## 2011-09-07 NOTE — Preoperative (Signed)
Beta Blockers   Reason not to administer Beta Blockers:Not Applicable. No home beta blockers 

## 2011-09-08 ENCOUNTER — Encounter (HOSPITAL_COMMUNITY): Payer: Self-pay | Admitting: Surgery

## 2011-09-08 LAB — URINE CULTURE
Colony Count: NO GROWTH
Culture  Setup Time: 201306170257
Culture: NO GROWTH

## 2011-09-08 MED ORDER — OXYCODONE-ACETAMINOPHEN 5-325 MG PO TABS: 1.0000 | ORAL_TABLET | ORAL | Status: AC | PRN

## 2011-09-08 MED ORDER — ACETAMINOPHEN 325 MG PO TABS
650.0000 mg | ORAL_TABLET | Freq: Four times a day (QID) | ORAL | Status: DC | PRN
  Administered 2011-09-08: 650 mg via ORAL
  Filled 2011-09-08: qty 2

## 2011-09-08 NOTE — Progress Notes (Signed)
Mild nausea when I saw her, but abdomen soft and only with incisional tenderness. Believe she can go home but will need some phenergan if nausea persists

## 2011-09-08 NOTE — Anesthesia Postprocedure Evaluation (Signed)
  Anesthesia Post-op Note  Patient: Michelle Contreras  Procedure(s) Performed: Procedure(s) (LRB): LAPAROSCOPIC CHOLECYSTECTOMY WITH INTRAOPERATIVE CHOLANGIOGRAM (N/A)  Patient Location: PACU  Anesthesia Type: General  Level of Consciousness: awake  Airway and Oxygen Therapy: Patient Spontanous Breathing  Post-op Pain: mild  Post-op Assessment: Post-op Vital signs reviewed  Post-op Vital Signs: stable  Complications: No apparent anesthesia complications

## 2011-09-08 NOTE — Progress Notes (Signed)
1 Day Post-Op  Subjective: Feels good this morning, still sore from surgery, but able to mobilize. Tolerating diet without NV. Husband at bedside. Anxious to go home.  Objective: Vital signs in last 24 hours: Temp:  [99.1 F (37.3 C)-100.4 F (38 C)] 99.4 F (37.4 C) (06/18 0949) Pulse Rate:  [68-101] 80  (06/18 0949) Resp:  [18-21] 20  (06/18 0949) BP: (102-128)/(57-79) 115/69 mmHg (06/18 0949) SpO2:  [90 %-97 %] 97 % (06/18 0949) Last BM Date: 09/04/11  Intake/Output from previous day: 06/17 0701 - 06/18 0700 In: 2640 [P.O.:840; I.V.:1300; IV Piggyback:500] Out: 1710 [Urine:1700; Blood:10] Intake/Output this shift: Total I/O In: 240 [P.O.:240] Out: -   General appearance: alert, cooperative and no distress Abdomen: all surgical incisions appear to be well approximated, no evidence of bloating, +BS, +flatus, remains constipated (likely related to narcotics).  Lab Results:   Basename 09/07/11 1455 09/07/11 0715  WBC 8.6 7.3  HGB 10.2* 10.7*  HCT 30.1* 31.4*  PLT 135* 149*   BMET  Basename 09/07/11 1455 09/07/11 0715 09/06/11 1217  NA -- 139 141  K -- 3.6 4.1  CL -- 105 103  CO2 -- 24 27  GLUCOSE -- 112* 115*  BUN -- 8 15  CREATININE 0.87 0.93 --  CALCIUM -- 8.1* 9.4   PT/INR No results found for this basename: LABPROT:2,INR:2 in the last 72 hours ABG No results found for this basename: PHART:2,PCO2:2,PO2:2,HCO3:2 in the last 72 hours  Studies/Results: Dg Cholangiogram Operative  09/07/2011  *RADIOLOGY REPORT*  Clinical Data:   .  INTRAOPERATIVE CHOLANGIOGRAM  Comparison: Ultrasound and CT 09/06/2011  Findings: Intraoperative spot images show normal caliber biliary system.  No evidence of retained stone or obstruction.  Free passage of contrast into the small bowel noted.  The distal common bile duct is not well visualized or opacified on the submitted images.  Contrast is seen within the small bowel.  IMPRESSION: No visible retained stone or obstruction.  Distal  common bile duct not well opacified on images submitted.  These images were submitted for radiologic interpretation only. Please see the procedural report for the amount of contrast and the fluoroscopy time utilized.  Original Report Authenticated By: Cyndie Chime, M.D.   US Abdomen Complete  09/06/2011  *RADIOLOGY REPORT*  Clinical Data:  Fever.  Headache.  COMPLETE ABDOMINAL ULTRASOUND  Comparison:  None.  Findings:  Gallbladder:  Cholelithiasis is present.  Gallbladder wall measures 3 mm, normal.  No sonographic Murphy's sign.  No pericholecystic fluid.  Largest stone measures 12 mm.  Common bile duct:  Mildly enlarged for age.  No common duct stone.  Liver:  No intrahepatic biliary ductal dilation.  No mass lesion. Normal echotexture.  IVC:  Appears normal.  Pancreas:  No focal abnormality seen.  Spleen:  11.9 cm.  Normal echotexture.  Right Kidney:  11.9 cm. Normal echotexture.  Normal central sinus echo complex.  No calculi or hydronephrosis.  Left Kidney:  11.5 cm.  Normal central sinus echo complex.  1 cm cystic lesion is present in the lower pole.  This has internal echoes and no definite increased through transmission.  This may represent a debris filled cyst.  By ultrasound, this is indeterminant.  Abdominal aorta:  Bifurcation obscured by bowel gas.  No aneurysm.  IMPRESSION: 1.  Cholelithiasis without cholecystitis. 2.  Mild dilation of the common bile duct for age.  This is nonspecific, most commonly associated with prior passage of stone. Correlation with bilirubin recommended. No common duct stone  identified. 3.  Echogenic 1 cm cystic lesion in the left inferior renal pole is indeterminant.  27-month follow-up renal ultrasound is recommended in a patient without history of malignancy.  This likely represents a hemorrhagic cyst or debris filled cyst.  Original Report Authenticated By: Andreas Newport, M.D.   Ct Abdomen Pelvis W Contrast  09/06/2011  *RADIOLOGY REPORT*  Clinical Data: Fever and  abdominal pain.  Headache.  Chills.  Right upper quadrant discomfort.  CT ABDOMEN AND PELVIS WITH CONTRAST  Technique:  Multidetector CT imaging of the abdomen and pelvis was performed following the standard protocol during bolus administration of intravenous contrast.  Contrast: OMNIPAQUE IOHEXOL 300 MG/ML  SOLN  Comparison: None.  Findings: Consolidation is present in the posterior left lower lobe.  Dependent atelectasis in the right lower lobe.  Spleen appears normal.  Cholelithiasis is present with a small amount of gallbladder wall thickening and pericholecystic fluid. The findings are suspicious for acute cholecystitis.  Correlating with prior ultrasound, the pericholecystic fluid was not evident on the ultrasound.  Adrenal glands normal.  Left renal cystic lesions are present. One of these is too small to characterize.  The other demonstrates a few septations and is difficult to fully characterize on CT because of its small size of 11 mm.  The smaller lesion nearby is too small to characterize.  Normal delayed excretion of contrast in the kidneys.  Normal appendix.  Prominent stool burden in the ascending colon.  Small bowel is normal.  Hysterectomy.  Urinary bladder normal.  No free fluid.  Vasculature appears within normal limits.  No aggressive osseous lesions are present.  There is hyperostosis of the right iliac wing.  The appearance suggests a benign etiology such as old fibrous dysplasia or Paget's disease.  There is no cortical thickening.  Old trauma could produce a similar appearance.  There is no periosteal reaction or soft tissue mass.  IMPRESSION: 1.  CT evidence of acute cholecystitis.  Cholelithiasis with pericholecystic fluid.  The absence of the sonographic Murphy's sign on the prior exam is confusing.  Possibly this could be related to narcotic administration prior to ultrasound examination. 2.  Left lower lobe consolidation.  Differential considerations are pneumonia or aspiration  pneumonitis. 3.  Benign appearing hyperostosis of the right iliac wing.  The origin of this is unclear and could relate to old trauma, old fibrous dysplasia. 4. Bosniak IIF left interpolar renal cyst measuring 11 mm. 20-month follow-up ultrasound recommended.  Original Report Authenticated By: Andreas Newport, M.D.    Anti-infectives: Anti-infectives     Start     Dose/Rate Route Frequency Ordered Stop   09/07/11 0300   piperacillin-tazobactam (ZOSYN) IVPB 3.375 g  Status:  Discontinued        3.375 g 12.5 mL/hr over 240 Minutes Intravenous Every 8 hours 09/06/11 2029 09/07/11 1350   09/06/11 2030   piperacillin-tazobactam (ZOSYN) IVPB 3.375 g        3.375 g 100 mL/hr over 30 Minutes Intravenous  Once 09/06/11 2029 09/07/11 1034   09/06/11 1800   cefTRIAXone (ROCEPHIN) 1 g in dextrose 5 % 50 mL IVPB        1 g 100 mL/hr over 30 Minutes Intravenous  Once 09/06/11 1748 09/06/11 1836   09/06/11 1800   azithromycin (ZITHROMAX) 500 mg in dextrose 5 % 250 mL IVPB        500 mg 250 mL/hr over 60 Minutes Intravenous  Once 09/06/11 1748 09/06/11 1947  Assessment/Plan: s/p Procedure(s) (LRB): LAPAROSCOPIC CHOLECYSTECTOMY WITH INTRAOPERATIVE CHOLANGIOGRAM (N/A) Discharge F/u with Dr. Jamey Ripa in 2 weeks time  LOS: 2 days    Michelle Contreras, [facility] 09/08/2011

## 2011-09-08 NOTE — Discharge Instructions (Signed)

## 2011-09-08 NOTE — Progress Notes (Signed)
1500 Discharge teaching completed, patient verbalizes understanding and no further questions.  Patient discharged to home with husband.  Discharged by wheelchair, in stable condition. Bonna Gains RN

## 2011-09-08 NOTE — Discharge Summary (Signed)
Physician Discharge Summary  Patient ID: Michelle Contreras MRN: [id number] DOB/AGE: September 01, 1960 51 y.o.  Admit date: 09/06/2011 Discharge date: 09/08/2011  Admission Diagnoses: Acute calculus cholecystitis   Discharge Diagnoses: Status post cholesystectomy   Discharged Condition: stable  Hospital Course: 69 yof who is otherwise healthy who presented to [facility] on Friday last with c/o of ruq/epigastric abdominal pain radiating to her back. No prior history. Nausea but no emesis. She reports fevers since it started at home. Nothing making it better and exam made it worse. For this reason patient was admitted and scheduled for elective cholecystectomy. She underwent this procedure and has continued to progress well post operatively, remains hemodynamically stable, she has maintained a low grade temp; most likely secondary to splinting and atelectasis. At this time she is deemed stable for discharge home.   Consults: None  Significant Diagnostic Studies: labs and microbiology.  Treatments: IV hydration, antibiotics, analgesia, respiratory therapy, and surgery:  Discharge Exam: Blood pressure 115/69, pulse 80, temperature 99.4 F (37.4 C), temperature source Oral, resp. rate 20, height 5\' 10"  (1.778 m), weight 174 lb 13.2 oz (79.3 kg), SpO2 97.00%. General appearance: alert, cooperative, appears stated age, no distress and mildly obese Abdomen is soft, tender to palpation over surgical sites, +BS, and flatus, no BM yet. No bloating. No NV reported.  Disposition: Discharge to home; follow-up with Dr. Jamey Ripa in 2 weeks time.   Medication List  As of 09/08/2011 10:47 AM   ASK your doctor about these medications         b complex vitamins capsule   Take 1 capsule by mouth daily.      estradiol 0.5 MG tablet   Commonly known as: ESTRACE   Take 0.5 mg by mouth daily.      HYDROcodone-acetaminophen 5-325 MG per tablet   Commonly known as: NORCO   Take 1 tablet by mouth every 6 (six) hours as  needed. For pain      ibuprofen 200 MG tablet   Commonly known as: ADVIL,MOTRIN   Take 200 mg by mouth every 6 (six) hours as needed. For pain      LIDODERM 5 %   Generic drug: lidocaine   Place 1 patch onto the skin daily.      multivitamin tablet   Take 1 tablet by mouth daily.      SUMAtriptan 100 MG tablet   Commonly known as: IMITREX      VITAMIN D-3 PO   Take 1,000 mg by mouth. Take 1 capsule by mouth once daily             Signed: Blenda Mounts 09/08/2011, 10:47 AM

## 2011-09-12 LAB — CULTURE, BLOOD (ROUTINE X 2)
Culture  Setup Time: 201306161657
Culture  Setup Time: 201306161657
Culture: NO GROWTH
Culture: NO GROWTH

## 2011-09-15 ENCOUNTER — Telehealth (INDEPENDENT_AMBULATORY_CARE_PROVIDER_SITE_OTHER): Payer: Self-pay | Admitting: General Surgery

## 2011-09-15 NOTE — Telephone Encounter (Signed)
PT CALLED TO SAY SHE HAD BEEN PROGRESSING FROM SURGERY WITH DAILY ACTIVITY, AND THAT YESTERDAY SHE STARTED FEELING AS I SHE HAD OVERDONE ACTIVITY AND SHE WAS WORN OUT. EATING OK, NO NAUSEA OR VOMITING, NO EVER AND BOWELS ARE MOVING. SHE IS NOT TAKING ANY PAIN MEDICATION. SHE DID NOTICE SOME TIGHTNESS IN CHEST TODAY/ SHE WAS GIVEN BREATHING TREATMENT APPARATUS FROM HOSPITAL TO USE AT HOME. I ENCOURAGED HER TO USE THAT TODAY TO SEE IF SYMPTOMS IMPROVE/ NO DRAINAGE FROM INCISION SITES OR REDNESS. SHE WILL DO LESS ACTIVITY TODAY AND CALL TOMORROW IF SYMPTOMS DO NOT IMPROVE/GY/ SHE HAS F/U APPT SCHEDULED WITH DR. Jamey Ripa NEXT WEEK/GY

## 2011-09-22 ENCOUNTER — Encounter (INDEPENDENT_AMBULATORY_CARE_PROVIDER_SITE_OTHER): Payer: Self-pay | Admitting: Surgery

## 2011-09-22 ENCOUNTER — Ambulatory Visit (INDEPENDENT_AMBULATORY_CARE_PROVIDER_SITE_OTHER): Payer: Managed Care, Other (non HMO) | Admitting: Surgery

## 2011-09-22 VITALS — BP 112/76 | HR 106 | Temp 99.2°F | Resp 16 | Ht 69.5 in | Wt 174.4 lb

## 2011-09-22 DIAGNOSIS — Z09 Encounter for follow-up examination after completed treatment for conditions other than malignant neoplasm: Secondary | ICD-10-CM

## 2011-09-22 DIAGNOSIS — Q619 Cystic kidney disease, unspecified: Secondary | ICD-10-CM

## 2011-09-22 DIAGNOSIS — N281 Cyst of kidney, acquired: Secondary | ICD-10-CM | POA: Insufficient documentation

## 2011-09-22 NOTE — Progress Notes (Signed)
Michelle Contreras       DOB: [birth year]           DATE: 09/22/2011       JXB:[id number]   CC: Postop laparoscopic cholecystectomy  HPI:  This patient underwent a laparoscopic cholecystectomy With operative cholangiogram on 09/07/11. She is in for her first postoperative visit. She notes that her incisional pain has resolved. Her preoperative symptoms have improved. She is not having problems with nausea, vomiting, diarrhea, fevers, chills, or urinary symptoms. She is tolerating diet. She feels that she is progressing well and nearly back to normal. PE:  VS: BP 112/76  Pulse 106  Temp 99.2 F (37.3 C) (Temporal)  Resp 16  Ht 5' 9.5" (1.765 m)  Wt 174 lb 6.4 oz (79.107 kg)  BMI 25.39 kg/m2  General: The patient is alert and appears comfortable, NAD.  Abdomen: Soft and benign. The incisions are healing nicely. There are no apparent problems.  Data reviewed: IOC:  Negative Pathology:  Showed cholecystitis and cholelithiasis  Impression:  The patient appears to be doing well, with improvement in her symptoms.  Plan:  She may resume full activity and regular diet. She  will followup with [place] on a p.r.n. basis. I did tell her that she may still have some foods that cause indigestion and ask her to call us if there are any questions, problems or concerns. A renal cyst is noted incidentally on the CT scan done when she was diagnosed with gallstones. She has a strong family history of renal cell cancer with her father and paternal aunt. While this is likely an incidental finding we'll see if the urologist can take a look at her and make recommendations for long-term followup.

## 2011-09-22 NOTE — Patient Instructions (Signed)
We will try to arrange for a urology consultation because of the cyst of the kidney noted on your CT scan here at the hospital with gallstones. Otherwise we'll see you back here on an as-needed basis.

## 2011-10-13 ENCOUNTER — Telehealth (INDEPENDENT_AMBULATORY_CARE_PROVIDER_SITE_OTHER): Payer: Self-pay | Admitting: General Surgery

## 2011-10-13 NOTE — Telephone Encounter (Signed)
Pt calling for advice.  She is 5 weeks postop lap chole and thinks she may have pulled a muscle in abdomen with increased physical exertion.  Pain in abdomen today.  Recommended ibuprofen or Aleve for 24 hours, taking it around the clock, for the anti-inflammatory effect.  Also suggested ice or heat to the site for comfort.  She understands and will try.  Will call back in 48 hours if not improved.

## 2011-12-28 ENCOUNTER — Other Ambulatory Visit: Payer: Self-pay | Admitting: Family Medicine

## 2011-12-28 DIAGNOSIS — Z1231 Encounter for screening mammogram for malignant neoplasm of breast: Secondary | ICD-10-CM

## 2012-01-19 ENCOUNTER — Ambulatory Visit
Admission: RE | Admit: 2012-01-19 | Discharge: 2012-01-19 | Disposition: A | Discharge status: Home / Self Care | Payer: Managed Care, Other (non HMO) | Source: Ambulatory Visit | Attending: Family Medicine | Admitting: Family Medicine

## 2012-01-19 DIAGNOSIS — Z1231 Encounter for screening mammogram for malignant neoplasm of breast: Secondary | ICD-10-CM

## 2013-01-16 ENCOUNTER — Other Ambulatory Visit: Payer: Self-pay

## 2013-01-16 DIAGNOSIS — Z1231 Encounter for screening mammogram for malignant neoplasm of breast: Secondary | ICD-10-CM

## 2013-02-06 ENCOUNTER — Ambulatory Visit
Admission: RE | Admit: 2013-02-06 | Discharge: 2013-02-06 | Disposition: A | Discharge status: Home / Self Care | Payer: Private Health Insurance - Indemnity | Source: Ambulatory Visit

## 2013-02-06 DIAGNOSIS — Z1231 Encounter for screening mammogram for malignant neoplasm of breast: Secondary | ICD-10-CM

## 2014-06-26 ENCOUNTER — Other Ambulatory Visit: Payer: Self-pay

## 2014-06-26 DIAGNOSIS — Z1231 Encounter for screening mammogram for malignant neoplasm of breast: Secondary | ICD-10-CM

## 2014-07-11 ENCOUNTER — Ambulatory Visit
Admission: RE | Admit: 2014-07-11 | Discharge: 2014-07-11 | Disposition: A | Discharge status: Home / Self Care | Payer: BLUE CROSS/BLUE SHIELD | Source: Ambulatory Visit

## 2014-07-11 DIAGNOSIS — Z1231 Encounter for screening mammogram for malignant neoplasm of breast: Secondary | ICD-10-CM

## 2015-03-24 HISTORY — PX: LEG SURGERY: SHX1003

## 2015-06-25 ENCOUNTER — Emergency Department (HOSPITAL_BASED_OUTPATIENT_CLINIC_OR_DEPARTMENT_OTHER): Payer: BLUE CROSS/BLUE SHIELD

## 2015-06-25 ENCOUNTER — Emergency Department (HOSPITAL_BASED_OUTPATIENT_CLINIC_OR_DEPARTMENT_OTHER)
Admission: EM | Admit: 2015-06-25 | Discharge: 2015-06-26 | Disposition: A | Discharge status: Home / Self Care | Payer: BLUE CROSS/BLUE SHIELD | Attending: Emergency Medicine | Admitting: Emergency Medicine

## 2015-06-25 ENCOUNTER — Encounter (HOSPITAL_BASED_OUTPATIENT_CLINIC_OR_DEPARTMENT_OTHER): Payer: Self-pay

## 2015-06-25 DIAGNOSIS — Z87828 Personal history of other (healed) physical injury and trauma: Secondary | ICD-10-CM | POA: Insufficient documentation

## 2015-06-25 DIAGNOSIS — K219 Gastro-esophageal reflux disease without esophagitis: Secondary | ICD-10-CM | POA: Diagnosis not present

## 2015-06-25 DIAGNOSIS — R112 Nausea with vomiting, unspecified: Secondary | ICD-10-CM

## 2015-06-25 DIAGNOSIS — R34 Anuria and oliguria: Secondary | ICD-10-CM | POA: Insufficient documentation

## 2015-06-25 DIAGNOSIS — Z793 Long term (current) use of hormonal contraceptives: Secondary | ICD-10-CM | POA: Diagnosis not present

## 2015-06-25 DIAGNOSIS — R5383 Other fatigue: Secondary | ICD-10-CM | POA: Diagnosis not present

## 2015-06-25 DIAGNOSIS — R630 Anorexia: Secondary | ICD-10-CM | POA: Insufficient documentation

## 2015-06-25 DIAGNOSIS — Z85830 Personal history of malignant neoplasm of bone: Secondary | ICD-10-CM | POA: Insufficient documentation

## 2015-06-25 DIAGNOSIS — K5900 Constipation, unspecified: Secondary | ICD-10-CM | POA: Diagnosis not present

## 2015-06-25 DIAGNOSIS — Z79899 Other long term (current) drug therapy: Secondary | ICD-10-CM | POA: Diagnosis not present

## 2015-06-25 DIAGNOSIS — Z87891 Personal history of nicotine dependence: Secondary | ICD-10-CM | POA: Insufficient documentation

## 2015-06-25 DIAGNOSIS — Z8619 Personal history of other infectious and parasitic diseases: Secondary | ICD-10-CM | POA: Diagnosis not present

## 2015-06-25 DIAGNOSIS — R109 Unspecified abdominal pain: Secondary | ICD-10-CM | POA: Insufficient documentation

## 2015-06-25 LAB — COMPREHENSIVE METABOLIC PANEL
ALK PHOS: 47 U/L (ref 38–126)
ALT: 23 U/L (ref 14–54)
ANION GAP: 8 (ref 5–15)
AST: 24 U/L (ref 15–41)
Albumin: 3.9 g/dL (ref 3.5–5.0)
BUN: 16 mg/dL (ref 6–20)
CALCIUM: 8.8 mg/dL — AB (ref 8.9–10.3)
CHLORIDE: 98 mmol/L — AB (ref 101–111)
CO2: 27 mmol/L (ref 22–32)
Creatinine, Ser: 0.65 mg/dL (ref 0.44–1.00)
GFR calc non Af Amer: >60 mL/min (ref >60)
Glucose, Bld: 110 mg/dL — ABNORMAL HIGH (ref 65–99)
POTASSIUM: 4 mmol/L (ref 3.5–5.1)
Sodium: 133 mmol/L — ABNORMAL LOW (ref 135–145)
Total Bilirubin: 0.8 mg/dL (ref 0.3–1.2)
Total Protein: 6.5 g/dL (ref 6.5–8.1)

## 2015-06-25 LAB — URINALYSIS, ROUTINE W REFLEX MICROSCOPIC
BILIRUBIN URINE: NEGATIVE
Glucose, UA: NEGATIVE mg/dL
Hgb urine dipstick: NEGATIVE
Ketones, ur: 40 mg/dL — AB
LEUKOCYTES UA: NEGATIVE
NITRITE: NEGATIVE
Protein, ur: NEGATIVE mg/dL
SPECIFIC GRAVITY, URINE: 1.009 (ref 1.005–1.030)
pH: 6.5 (ref 5.0–8.0)

## 2015-06-25 LAB — CBC WITH DIFFERENTIAL/PLATELET
Basophils Absolute: 0 10*3/uL (ref 0.0–0.1)
Basophils Relative: 0 %
EOS PCT: 0 %
Eosinophils Absolute: 0 10*3/uL (ref 0.0–0.7)
HCT: 40.1 % (ref 36.0–46.0)
Hemoglobin: 13.9 g/dL (ref 12.0–15.0)
LYMPHS ABS: 1.6 10*3/uL (ref 0.7–4.0)
LYMPHS PCT: 22 %
MCH: 30.2 pg (ref 26.0–34.0)
MCHC: 34.7 g/dL (ref 30.0–36.0)
MCV: 87 fL (ref 78.0–100.0)
MONO ABS: 0.6 10*3/uL (ref 0.1–1.0)
Monocytes Relative: 8 %
Neutro Abs: 5.2 10*3/uL (ref 1.7–7.7)
Neutrophils Relative %: 70 %
PLATELETS: 199 10*3/uL (ref 150–400)
RBC: 4.61 MIL/uL (ref 3.87–5.11)
RDW: 12.2 % (ref 11.5–15.5)
WBC: 7.4 10*3/uL (ref 4.0–10.5)

## 2015-06-25 LAB — LIPASE, BLOOD: Lipase: 29 U/L (ref 11–51)

## 2015-06-25 MED ORDER — SODIUM CHLORIDE 0.9 % IV BOLUS (SEPSIS)
1000.0000 mL | Freq: Once | INTRAVENOUS | Status: AC
  Administered 2015-06-25: 1000 mL via INTRAVENOUS

## 2015-06-25 MED ORDER — ONDANSETRON HCL 4 MG/2ML IJ SOLN
4.0000 mg | Freq: Once | INTRAMUSCULAR | Status: AC
  Administered 2015-06-25: 4 mg via INTRAVENOUS
  Filled 2015-06-25: qty 2

## 2015-06-25 MED ORDER — METOCLOPRAMIDE HCL 5 MG/ML IJ SOLN
10.0000 mg | Freq: Once | INTRAMUSCULAR | Status: AC
  Administered 2015-06-25: 10 mg via INTRAVENOUS
  Filled 2015-06-25: qty 2

## 2015-06-25 NOTE — ED Provider Notes (Signed)
CSN: [id number]     Arrival date & time 06/25/15  1927 History  By signing my name below, I, Rowan Blase, attest that this documentation has been prepared under the direction and in the presence of Forde Dandy, MD . Electronically Signed: Rowan Blase, Scribe. 06/25/2015. 9:07 PM.    Chief Complaint  Patient presents with  . Abdominal Pain   The history is provided by the patient. No language interpreter was used.   HPI Comments:  Michelle Contreras is a 55 y.o. 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 Pt reports associated decreased PO intake, 4 episodes of watery vomit, fatigue, decreased urination past two days. Pt states she injured her left hip ~3 weeks ago and has been taking hydrocodone and oxycodone since the injury. Pt reports her last bowel movement was two days ago and was small, hard, and difficult to pass; she reports BMs once a week since starting pain medication. She took Exlax and an extra strength laxative two days ago with no relief. Pt denies abdominal bloating, frequency, burning with urination, chest pain, shortness of breath or rectal fullness. Passing gas per patient.   Past Medical History  Diagnosis Date  . HSV (herpes simplex virus) anogenital infection     Hx of  . Sarcoma ([place])     Lower Extremitiy  . GERD (gastroesophageal reflux disease)   . PONV (postoperative nausea and vomiting)    Past Surgical History  Procedure Laterality Date  . Amputation of lower limb      left leg, below knee  . Abdominal hysterectomy    . Neck surgery    . Removal of benign growth to r breast    . Cholecystectomy  09/07/2011    Procedure: LAPAROSCOPIC CHOLECYSTECTOMY WITH INTRAOPERATIVE CHOLANGIOGRAM;  Surgeon: Haywood Lasso, MD;  Location: [facility] OR;  Service: General;  Laterality: N/A;   Family History  Problem Relation Age  of Onset  . Breast cancer Father   . Colon cancer Father   . Colon polyps Father   . Heart disease Father     ?  Marland Kitchen Cancer Father     breast and colon and kidney  . Kidney cancer Paternal Aunt   . Diabetes Mother   . Kidney disease Mother     currently on dialysis  . Cancer Mother     lung  . Diabetes Maternal Grandfather   . Colon polyps Brother    Social History  Substance Use Topics  . Smoking status: Former Research scientist (life sciences)  . Smokeless tobacco: None     Comment: smoked from age [age]. no longer smokes  . Alcohol Use: 0.5 oz/week    1 drink(s) per week     Comment: occasional-weekends   OB History    No data available     Review of Systems  Constitutional: Positive for appetite change and fatigue.  Respiratory: Negative for shortness of breath.   Cardiovascular: Negative for chest pain.  Gastrointestinal: Positive for nausea, vomiting, abdominal pain and constipation. Negative for abdominal distention.  Genitourinary: Positive for decreased urine volume. Negative for dysuria and frequency.  All other systems reviewed and are negative.  Allergies  Adhesive  Home Medications   Prior to Admission medications   Medication Sig Start Date End Date Taking? Authorizing Provider  oxyCODONE-acetaminophen (PERCOCET) 10-325 MG tablet Take 1 tablet by mouth every 4 (four)  hours as needed for pain.   Yes Historical Provider, MD  b complex vitamins capsule Take 1 capsule by mouth daily.      Historical Provider, MD  Cholecalciferol (VITAMIN D-3 PO) Take 1,000 mg by mouth. Take 1 capsule by mouth once daily    Historical Provider, MD  docusate sodium (COLACE) 100 MG capsule Take 1 capsule (100 mg total) by mouth every 12 (twelve) hours. 06/26/15   Forde Dandy, MD  estradiol (ESTRACE) 0.5 MG tablet Take 0.5 mg by mouth daily.      Historical Provider, MD  ibuprofen (ADVIL,MOTRIN) 200 MG tablet Take 200 mg by mouth every 6 (six) hours as needed. For pain    Historical Provider, MD  LIDODERM  5 % Place 1 patch onto the skin daily.  09/15/10   Historical Provider, MD  metoCLOPramide (REGLAN) 10 MG tablet Take 1 tablet (10 mg total) by mouth every 6 (six) hours. 06/26/15   Forde Dandy, MD  Multiple Vitamin (MULTIVITAMIN) tablet Take 1 tablet by mouth daily.      Historical Provider, MD  polyethylene glycol powder (GLYCOLAX/MIRALAX) powder Take 17 g by mouth once. Dissolve one capful of powder into liquid and take once daily. 06/26/15   Forde Dandy, MD  SUMAtriptan (IMITREX) 100 MG tablet  06/19/10   Historical Provider, MD   BP 129/77 mmHg  Pulse 76  Temp(Src) 98.9 F (37.2 C) (Oral)  Resp 16  SpO2 99% Physical Exam Physical Exam  Nursing note and vitals reviewed. Constitutional: Well developed, well nourished, non-toxic, and in no acute distress Head: Normocephalic and atraumatic.  Mouth/Throat: Oropharynx is clear. Dry mucous membranes.  Neck: Normal range of motion. Neck supple.  Cardiovascular: Normal rate and regular rhythm.   Pulmonary/Chest: Effort normal and breath sounds normal.  Abdominal: Soft. There is no tenderness. There is no rebound and no guarding.  Musculoskeletal: Normal range of motion.  Neurological: Alert, no facial droop, fluent speech, moves all extremities symmetrically Skin: Skin is warm and dry.  Psychiatric: Cooperative  ED Course  Procedures  DIAGNOSTIC STUDIES:  Oxygen Saturation is 99% on RA, normal by my interpretation.    COORDINATION OF CARE:  8:45 PM Will administer fluids and nausea medication, order x-ray of abdomen, and check blood work and UA. Discussed treatment plan with pt at bedside and pt agreed to plan.  Labs Review Labs Reviewed  COMPREHENSIVE METABOLIC PANEL - Abnormal; Notable for the following:    Sodium 133 (*)    Chloride 98 (*)    Glucose, Bld 110 (*)    Calcium 8.8 (*)    All other components within normal limits  URINALYSIS, ROUTINE W REFLEX MICROSCOPIC (NOT AT [facility]) - Abnormal; Notable for the following:     Ketones, ur 40 (*)    All other components within normal limits  CBC WITH DIFFERENTIAL/PLATELET  LIPASE, BLOOD    Imaging Review Dg Abd Acute W/chest  06/25/2015  CLINICAL DATA:  Nausea, vomiting, constipation. Upper abdominal pain for 3 days. Fatigue and dehydration. EXAM: DG ABDOMEN ACUTE W/ 1V CHEST COMPARISON:  CT 09/06/2011 FINDINGS: The cardiomediastinal contours are normal. Subsegmental atelectasis versus scarring left lung base. The lungs are otherwise clear. There is no free intra-abdominal air. No dilated bowel loops to suggest obstruction. Moderate volume of stool throughout the colon. No radiopaque calculi. Surgical clips in the right upper quadrant from cholecystectomy. Mild levo scoliotic curvature of the lumbar spine and associated degenerative change. No acute osseous abnormalities are seen. IMPRESSION:  1. Normal bowel gas pattern with moderate stool burden. 2. Clear lungs. Electronically Signed   By: Jeb Levering M.D.   On: 06/25/2015 23:00   I have personally reviewed and evaluated these images and lab results as part of my medical decision-making.   EKG Interpretation   Date/Time:  Tuesday June 25 2015 21:00:21 EDT Ventricular Rate:  71 PR Interval:  181 QRS Duration: 91 QT Interval:  401 QTC Calculation: 436 R Axis:   36 Text Interpretation:  Sinus rhythm RSR' in V1 or V2, right VCD or RVH  Baseline wander in lead(s) V6 No significant change since last tracing  Confirmed by Ivanna Kocak MD, Latanja Lehenbauer [id number]) on 06/25/2015 9:09:19 PM      MDM   Final diagnoses:  Non-intractable vomiting with nausea, vomiting of unspecified type    55 year old female who presents with constipation and intermittent abdominal cramping. On presentation she is nontoxic in no acute distress. Vital signs within normal limits. She has a soft nondistended abdomen that is nontender to palpation. No concern for fecal impaction. Does have history of abdominal hysterectomy and cholecystectomy, so at risk  of potential obstruction, but clinically this does not appear to be so as she is currently has soft and benign abdomen without pain and is passing gas. History of using narcotic medications over past 3 weeks with severe constipation seems consistent with that of decreased bowel motility. X-ray of the abdomen without signs of obstruction and moderate stool burden. Basic blood work including CBC, complete metabolic panel, lipase, and urinalysis overall unremarkable. Patient is improved with IV antibiotics and IV fluids. She is tolerating by mouth intake without recurrence nausea or vomiting. Discussed starting bowel regimen with MiraLAX and Colace and following up with PCP this week for reevaluation. Reviewed strict return instructions as well as warning signs of bowel obstruction that would require ED evaluation and further imaging. She expressed understanding of all discharge instructions, and felt comfortable to plan of care.  I personally performed the services described in this documentation, which was scribed in my presence. The recorded information has been reviewed and is accurate.    Forde Dandy, MD 06/26/15 [phone]

## 2015-06-25 NOTE — ED Notes (Signed)
C/o weakness abd pain decreased intake since Sunday  Is on pain meds x 3 weeks

## 2015-06-25 NOTE — ED Notes (Signed)
Patient hurt her left hip about 3 weeks ago.  She has been on multiple pain medications since she hurt her hip, and is now having constipation. The patient reports that 2 days ago she had become so sick that She had an MRI that she was supposed to get the results yesterday and was unable to go. Patient reports that she is having Nausea, HA - unable to eat or drink due to Nausea. Weakness per the patient r/t to not being able to eat x 48 hours

## 2015-06-25 NOTE — ED Notes (Signed)
Patient hurt her left hip about 3 weeks ago. She had an MRI that she was supposed to get the results yesterday, however she was "so sick" that she was unable to go. Patient reports that she is having Nausea, HA - unable to eat or drink due to Nausea.

## 2015-06-25 NOTE — ED Notes (Signed)
MD at bedside. 

## 2015-06-26 MED ORDER — POLYETHYLENE GLYCOL 3350 17 GM/SCOOP PO POWD: 17.0000 g | Freq: Once | ORAL | Status: DC

## 2015-06-26 MED ORDER — DOCUSATE SODIUM 100 MG PO CAPS: 100.0000 mg | ORAL_CAPSULE | Freq: Two times a day (BID) | ORAL | Status: DC

## 2015-06-26 MED ORDER — METOCLOPRAMIDE HCL 10 MG PO TABS: 10.0000 mg | ORAL_TABLET | Freq: Four times a day (QID) | ORAL | Status: DC

## 2015-06-26 NOTE — Discharge Instructions (Signed)
There is some moderate stool burden in your colon that may be suggestive of constipation. Take nausea medication as needed, but start taking a bowel regimen prescribed. You can increase Miralax to twice daily as needed if no response in 3 days.   Return without fail for worsening symptoms, including abdominal pain, persistent vomiting, fever, or any other symptoms concerning to you.   Nausea and Vomiting Nausea means you feel sick to your stomach. Throwing up (vomiting) is a reflex where stomach contents come out of your mouth. HOME CARE   Take medicine as told by your doctor.  Do not force yourself to eat. However, you do need to drink fluids.  If you feel like eating, eat a normal diet as told by your doctor.  Eat rice, wheat, potatoes, bread, lean meats, yogurt, fruits, and vegetables.  Avoid high-fat foods.  Drink enough fluids to keep your pee (urine) clear or pale yellow.  Ask your doctor how to replace body fluid losses (rehydrate). Signs of body fluid loss (dehydration) include:  Feeling very thirsty.  Dry lips and mouth.  Feeling dizzy.  Dark pee.  Peeing less than normal.  Feeling confused.  Fast breathing or heart rate. GET HELP RIGHT AWAY IF:   You have blood in your throw up.  You have black or bloody poop (stool).  You have a bad headache or stiff neck.  You feel confused.  You have bad belly (abdominal) pain.  You have chest pain or trouble breathing.  You do not pee at least once every 8 hours.  You have cold, clammy skin.  You keep throwing up after 24 to 48 hours.  You have a fever. MAKE SURE YOU:   Understand these instructions.  Will watch your condition.  Will get help right away if you are not doing well or get worse.   This information is not intended to replace advice given to you by your health care provider. Make sure you discuss any questions you have with your health care provider.   Document Released: 08/26/2007 Document  Revised: 06/01/2011 Document Reviewed: 08/08/2010 Elsevier Interactive Patient Education [employer].

## 2015-07-09 ENCOUNTER — Other Ambulatory Visit: Payer: Self-pay | Admitting: Surgical

## 2015-07-09 NOTE — Patient Instructions (Signed)
Michelle Contreras  07/09/2015   Your procedure is scheduled on: July 17, 2015  Report to [facility] Main  Entrance take [facility]  elevators to 3rd floor to  [place] at 8:00 AM.  Call this number if you have problems the morning of surgery [phone]   Remember: ONLY 1 PERSON MAY GO WITH YOU TO SHORT STAY TO GET  READY MORNING OF [place].  Do not eat food or drink liquids :After Midnight.     Take these medicines the morning of surgery with A SIP OF WATER: Zyrtec, Oxycodone if needed DO NOT TAKE ANY DIABETIC MEDICATIONS DAY OF YOUR SURGERY                               You may not have any metal on your body including hair pins and              piercings  Do not wear jewelry, make-up, lotions, powders or perfumes, deodorant             Do not wear nail polish.  Do not shave  48 hours prior to surgery.                Do not bring valuables to the hospital. [place].  Contacts, dentures or bridgework may not be worn into surgery.  Leave suitcase in the car. After surgery it may be brought to your room.       Special Instructions: coughing and deep breathing exercises, leg exercises              Please read over the following fact sheets you were given: _____________________________________________________________________             [facility] - Preparing for Surgery Before surgery, you can play an important role.  Because skin is not sterile, your skin needs to be as free of germs as possible.  You can reduce the number of germs on your skin by washing with CHG (chlorahexidine gluconate) soap before surgery.  CHG is an antiseptic cleaner which kills germs and bonds with the skin to continue killing germs even after washing. Please DO NOT use if you have an allergy to CHG or antibacterial soaps.  If your skin becomes reddened/irritated stop using the CHG and inform your nurse when you arrive at  Short Stay. Do not shave (including legs and underarms) for at least 48 hours prior to the first CHG shower.  You may shave your face/neck. Please follow these instructions carefully:  1.  Shower with CHG Soap the night before surgery and the  morning of Surgery.  2.  If you choose to wash your hair, wash your hair first as usual with your  normal  shampoo.  3.  After you shampoo, rinse your hair and body thoroughly to remove the  shampoo.                           4.  Use CHG as you would any other liquid soap.  You can apply chg directly  to the skin and wash  Gently with a scrungie or clean washcloth.  5.  Apply the CHG Soap to your body ONLY FROM THE NECK DOWN.   Do not use on face/ open                           Wound or open sores. Avoid contact with eyes, ears mouth and genitals (private parts).                       Wash face,  Genitals (private parts) with your normal soap.             6.  Wash thoroughly, paying special attention to the area where your surgery  will be performed.  7.  Thoroughly rinse your body with warm water from the neck down.  8.  DO NOT shower/wash with your normal soap after using and rinsing off  the CHG Soap.                9.  Pat yourself dry with a clean towel.            10.  Wear clean pajamas.            11.  Place clean sheets on your bed the night of your first shower and do not  sleep with pets. Day of Surgery : Do not apply any lotions/deodorants the morning of surgery.  Please wear clean clothes to the hospital/surgery center.  FAILURE TO FOLLOW THESE INSTRUCTIONS MAY RESULT IN THE CANCELLATION OF YOUR SURGERY PATIENT SIGNATURE_________________________________  NURSE SIGNATURE__________________________________  ________________________________________________________________________  WHAT IS A BLOOD TRANSFUSION? Blood Transfusion Information  A transfusion is the replacement of blood or some of its parts. Blood is made up  of multiple cells which provide different functions.  Red blood cells carry oxygen and are used for blood loss replacement.  White blood cells fight against infection.  Platelets control bleeding.  Plasma helps clot blood.  Other blood products are available for specialized needs, such as hemophilia or other clotting disorders. BEFORE THE TRANSFUSION  Who gives blood for transfusions?   Healthy volunteers who are fully evaluated to make sure their blood is safe. This is blood bank blood. Transfusion therapy is the safest it has ever been in the practice of medicine. Before blood is taken from a donor, a complete history is taken to make sure that person has no history of diseases nor engages in risky social behavior (examples are intravenous drug use or sexual activity with multiple partners). The donor's travel history is screened to minimize risk of transmitting infections, such as malaria. The donated blood is tested for signs of infectious diseases, such as HIV and hepatitis. The blood is then tested to be sure it is compatible with you in order to minimize the chance of a transfusion reaction. If you or a relative donates blood, this is often done in anticipation of surgery and is not appropriate for emergency situations. It takes many days to process the donated blood. RISKS AND COMPLICATIONS Although transfusion therapy is very safe and saves many lives, the main dangers of transfusion include:  1. Getting an infectious disease. 2. Developing a transfusion reaction. This is an allergic reaction to something in the blood you were given. Every precaution is taken to prevent this. The decision to have a blood transfusion has been considered carefully by your caregiver before blood is given. Blood is not given unless the benefits outweigh  the risks. AFTER THE TRANSFUSION  Right after receiving a blood transfusion, you will usually feel much better and more energetic. This is especially true  if your red blood cells have gotten low (anemic). The transfusion raises the level of the red blood cells which carry oxygen, and this usually causes an energy increase.  The nurse administering the transfusion will monitor you carefully for complications. HOME CARE INSTRUCTIONS  No special instructions are needed after a transfusion. You may find your energy is better. Speak with your caregiver about any limitations on activity for underlying diseases you may have. SEEK MEDICAL CARE IF:   Your condition is not improving after your transfusion.  You develop redness or irritation at the intravenous (IV) site. SEEK IMMEDIATE MEDICAL CARE IF:  Any of the following symptoms occur over the next 12 hours:  Shaking chills.  You have a temperature by mouth above 102 F (38.9 C), not controlled by medicine.  Chest, back, or muscle pain.  People around you feel you are not acting correctly or are confused.  Shortness of breath or difficulty breathing.  Dizziness and fainting.  You get a rash or develop hives.  You have a decrease in urine output.  Your urine turns a dark color or changes to pink, red, or brown. Any of the following symptoms occur over the next 10 days:  You have a temperature by mouth above 102 F (38.9 C), not controlled by medicine.  Shortness of breath.  Weakness after normal activity.  The white part of the eye turns yellow (jaundice).  You have a decrease in the amount of urine or are urinating less often.  Your urine turns a dark color or changes to pink, red, or brown. Document Released: 03/06/2000 Document Revised: 06/01/2011 Document Reviewed: 10/24/2007 ExitCare Patient Information 2014 [place].  _______________________________________________________________________  Incentive Spirometer  An incentive spirometer is a tool that can help keep your lungs clear and active. This tool measures how well you are filling your lungs with each  breath. Taking long deep breaths may help reverse or decrease the chance of developing breathing (pulmonary) problems (especially infection) following:  A long period of time when you are unable to move or be active. BEFORE THE PROCEDURE   If the spirometer includes an indicator to show your best effort, your nurse or respiratory therapist will set it to a desired goal.  If possible, sit up straight or lean slightly forward. Try not to slouch.  Hold the incentive spirometer in an upright position. INSTRUCTIONS FOR USE  3. Sit on the edge of your bed if possible, or sit up as far as you can in bed or on a chair. 4. Hold the incentive spirometer in an upright position. 5. Breathe out normally. 6. Place the mouthpiece in your mouth and seal your lips tightly around it. 7. Breathe in slowly and as deeply as possible, raising the piston or the ball toward the top of the column. 8. Hold your breath for 3-5 seconds or for as long as possible. Allow the piston or ball to fall to the bottom of the column. 9. Remove the mouthpiece from your mouth and breathe out normally. 10. Rest for a few seconds and repeat Steps 1 through 7 at least 10 times every 1-2 hours when you are awake. Take your time and take a few normal breaths between deep breaths. 11. The spirometer may include an indicator to show your best effort. Use the indicator as a goal to work  toward during each repetition. 12. After each set of 10 deep breaths, practice coughing to be sure your lungs are clear. If you have an incision (the cut made at the time of surgery), support your incision when coughing by placing a pillow or rolled up towels firmly against it. Once you are able to get out of bed, walk around indoors and cough well. You may stop using the incentive spirometer when instructed by your caregiver.  RISKS AND COMPLICATIONS  Take your time so you do not get dizzy or light-headed.  If you are in pain, you may need to take or ask  for pain medication before doing incentive spirometry. It is harder to take a deep breath if you are having pain. AFTER USE  Rest and breathe slowly and easily.  It can be helpful to keep track of a log of your progress. Your caregiver can provide you with a simple table to help with this. If you are using the spirometer at home, follow these instructions: [place] IF:   You are having difficultly using the spirometer.  You have trouble using the spirometer as often as instructed.  Your pain medication is not giving enough relief while using the spirometer.  You develop fever of 100.5 F (38.1 C) or higher. SEEK IMMEDIATE MEDICAL CARE IF:   You cough up bloody sputum that had not been present before.  You develop fever of 102 F (38.9 C) or greater.  You develop worsening pain at or near the incision site. MAKE SURE YOU:   Understand these instructions.  Will watch your condition.  Will get help right away if you are not doing well or get worse. Document Released: 07/20/2006 Document Revised: 06/01/2011 Document Reviewed: 09/20/2006 [facility] Patient Information 2014 [place], [place].   ________________________________________________________________________

## 2015-07-11 ENCOUNTER — Encounter (HOSPITAL_COMMUNITY)
Admission: RE | Admit: 2015-07-11 | Discharge: 2015-07-11 | Disposition: A | Discharge status: Home / Self Care | Payer: BLUE CROSS/BLUE SHIELD | Source: Ambulatory Visit | Attending: Orthopedic Surgery | Admitting: Orthopedic Surgery

## 2015-07-11 ENCOUNTER — Ambulatory Visit (HOSPITAL_COMMUNITY)
Admission: RE | Admit: 2015-07-11 | Discharge: 2015-07-11 | Disposition: A | Discharge status: Home / Self Care | Payer: BLUE CROSS/BLUE SHIELD | Source: Ambulatory Visit | Attending: Surgical | Admitting: Surgical

## 2015-07-11 ENCOUNTER — Encounter (HOSPITAL_COMMUNITY): Payer: Self-pay

## 2015-07-11 DIAGNOSIS — M545 Low back pain, unspecified: Secondary | ICD-10-CM

## 2015-07-11 DIAGNOSIS — M5136 Other intervertebral disc degeneration, lumbar region: Secondary | ICD-10-CM | POA: Insufficient documentation

## 2015-07-11 HISTORY — DX: Headache: R51

## 2015-07-11 HISTORY — DX: Unspecified osteoarthritis, unspecified site: M19.90

## 2015-07-11 HISTORY — DX: Headache, unspecified: R51.9

## 2015-07-11 LAB — SURGICAL PCR SCREEN
MRSA, PCR: NEGATIVE
Staphylococcus aureus: NEGATIVE

## 2015-07-11 LAB — APTT: aPTT: 27 seconds (ref 24–37)

## 2015-07-11 LAB — PROTIME-INR
INR: 1.12 (ref 0.00–1.49)
Prothrombin Time: 14.1 seconds (ref 11.6–15.2)

## 2015-07-11 LAB — ABO/RH: ABO/RH(D): O POS

## 2015-07-11 NOTE — Progress Notes (Signed)
07-05-15 - UA, CBC w/diff, CMP, Lipase lab results - EPIC 06-27-15 - DG ABD w/chest - EPIC 06-27-15 - EKG - EPIC 06-04-15 - LOV - K.Deatra Ina, [place] (fam.med.) - care everywhere

## 2015-07-16 NOTE — H&P (Signed)
Michelle Contreras is an 55 y.o. female.   Chief Complaint: low back pain HPI: The patient is a 55 year old female who presented with the chief complaint of buttocks pain. She originally had lateral hip pain as well. She saw some improvement in the lateral symptoms with greater trochanteric bursal cortisone injeciton, but continued to have buttocks and low back pain. She developed weakness in the left LE. MRI showed a disc herniation at L4-L5 on the left. 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   Past Medical History  Diagnosis Date  . HSV (herpes simplex virus) anogenital infection     Hx of  . Sarcoma ([place])     Lower Extremitiy  . GERD (gastroesophageal reflux disease)   . PONV (postoperative nausea and vomiting)   . Headache     migraines  . Arthritis     Past Surgical History  Procedure Laterality Date  . Amputation of lower limb      left leg, below knee  . Abdominal hysterectomy    . Neck surgery    . Removal of benign growth to r breast    . Cholecystectomy  09/07/2011    Procedure: LAPAROSCOPIC CHOLECYSTECTOMY WITH INTRAOPERATIVE CHOLANGIOGRAM;  Surgeon: Haywood Lasso, MD;  Location: [facility] OR;  Service: General;  Laterality: N/A;    Family History  Problem Relation Age of Onset  . Breast cancer Father   . Colon cancer Father   . Colon polyps Father   . Heart disease Father     ?  Marland Kitchen Cancer Father     breast and colon and kidney  . Kidney cancer Paternal Aunt   . Diabetes Mother   . Kidney disease Mother     currently on dialysis  . Cancer Mother     lung  . Diabetes Maternal Grandfather   . Colon polyps Brother    Social History:  reports that she quit smoking about 29 years ago. She has never used smokeless tobacco. She reports that she drinks about 0.5 oz of alcohol per week. She reports that she does not use illicit drugs.  Allergies:  Allergies  Allergen Reactions  . Adhesive [Tape] Other (See Comments)    Patient  reports full thickness blister to adhesive - has to use paper tape  . Tramadol Nausea And Vomiting  . Other Rash and Other (See Comments)    Topical Agents like lotions - blisters     Current outpatient prescriptions:  .  b complex vitamins capsule, Take 1 capsule by mouth daily.  , Disp: , Rfl:  .  cetirizine (ZYRTEC) 10 MG tablet, Take 10 mg by mouth daily., Disp: , Rfl:  .  Cholecalciferol (VITAMIN D3) 10000 units TABS, Take 1,000 Units by mouth daily., Disp: , Rfl:  .  docusate sodium (COLACE) 100 MG capsule, Take 1 capsule (100 mg total) by mouth every 12 (twelve) hours.  Marland Kitchen  estradiol (ESTRACE) 1 MG tablet, Take 0.5 mg by mouth daily., Disp: , Rfl:  .  ibuprofen (ADVIL,MOTRIN) 200 MG tablet, Take 800 mg by mouth every 6 (six) hours as needed. For pain, Disp: , Rfl:  .  lidocaine (LIDODERM) 5 %, Place 1 patch onto the skin daily as needed (For pain.). , Disp: , Rfl:  .  Multiple Vitamin (MULTIVITAMIN WITH MINERALS) TABS tablet, Take 1 tablet by mouth daily., Disp: , Rfl:  .  oxyCODONE-acetaminophen (PERCOCET) 10-325 MG tablet, Take 1 tablet  by mouth every 4 (four) hours as needed for pain., Disp: , Rfl:  .  polyethylene glycol powder (GLYCOLAX/MIRALAX) powder, Take 17 g by mouth once. Dissolve one capful of powder into liquid and take once daily.  .  SUMAtriptan (IMITREX) 100 MG tablet, Take 100 mg by mouth every 2 (two) hours as needed for migraine or headache. , Disp: , Rfl:  .  tretinoin (RETIN-A) 0.05 % cream, Apply 1 application topically at bedtime. Apply sparingly., Disp: , Rfl:   Review of Systems  Constitutional: Negative for fever, chills, weight loss, malaise/fatigue and diaphoresis.  HENT: Negative for congestion, ear discharge, ear pain, hearing loss, nosebleeds, sore throat and tinnitus.   Eyes: Negative.   Respiratory: Negative.  Negative for stridor.   Cardiovascular: Negative.   Gastrointestinal: Negative.   Genitourinary: Negative.   Musculoskeletal: Positive for  myalgias, back pain and joint pain. Negative for falls and neck pain.  Skin: Negative.   Neurological: Positive for tingling, sensory change, weakness and headaches. Negative for dizziness, speech change, seizures and loss of consciousness.  Endo/Heme/Allergies: Negative.   Psychiatric/Behavioral: Negative.     Physical Exam  Constitutional: She is oriented to person, place, and time. She appears well-developed and well-nourished. No distress.  HENT:  Head: Normocephalic and atraumatic.  Right Ear: External ear normal.  Left Ear: External ear normal.  Nose: Nose normal.  Mouth/Throat: Oropharynx is clear and moist.  Eyes: Conjunctivae and EOM are normal.  Neck: Normal range of motion. Neck supple.  Cardiovascular: Normal rate, regular rhythm, normal heart sounds and intact distal pulses.   No murmur heard. Respiratory: Effort normal and breath sounds normal. No respiratory distress. She has no wheezes.  GI: Soft. Bowel sounds are normal. She exhibits no distension. There is no tenderness.  Musculoskeletal:       Right hip: Normal.       Left hip: Normal.       Lumbar back: She exhibits pain and spasm.  BKA right LE   Neurological: She is alert and oriented to person, place, and time.  EHL weakness 2/5 on the left  Skin: No rash noted. She is not diaphoretic. No erythema.  Psychiatric: She has a normal mood and affect. Her behavior is normal.     Assessment/Plan Lumbar disc herniation L4-L5 left She needs a lumbar hemilaminectomy and microdiscectomy L4-L5 on the left. Risks and benefits of the procedure discussed with the patient by Dr. Latanya Maudlin.    H&P performed by Dr, Latanya Maudlin Documented by Ardeen Jourdain, PA-C  [place], Conleigh Heinlein Ander Purpura, PA-C 07/16/2015, 11:20 AM

## 2015-07-17 ENCOUNTER — Ambulatory Visit (HOSPITAL_COMMUNITY): Payer: BLUE CROSS/BLUE SHIELD | Admitting: Certified Registered Nurse Anesthetist

## 2015-07-17 ENCOUNTER — Ambulatory Visit (HOSPITAL_COMMUNITY): Payer: BLUE CROSS/BLUE SHIELD

## 2015-07-17 ENCOUNTER — Ambulatory Visit (HOSPITAL_COMMUNITY)
Admission: RE | Admit: 2015-07-17 | Discharge: 2015-07-18 | Disposition: A | Discharge status: Home / Self Care | Payer: BLUE CROSS/BLUE SHIELD | Source: Ambulatory Visit | Attending: Orthopedic Surgery | Admitting: Orthopedic Surgery

## 2015-07-17 ENCOUNTER — Encounter (HOSPITAL_COMMUNITY)
Admission: RE | Disposition: A | Discharge status: Home / Self Care | Payer: Self-pay | Source: Ambulatory Visit | Attending: Orthopedic Surgery

## 2015-07-17 ENCOUNTER — Encounter (HOSPITAL_COMMUNITY): Payer: Self-pay

## 2015-07-17 DIAGNOSIS — Z8589 Personal history of malignant neoplasm of other organs and systems: Secondary | ICD-10-CM | POA: Diagnosis not present

## 2015-07-17 DIAGNOSIS — Z91048 Other nonmedicinal substance allergy status: Secondary | ICD-10-CM | POA: Insufficient documentation

## 2015-07-17 DIAGNOSIS — M4806 Spinal stenosis, lumbar region: Secondary | ICD-10-CM | POA: Diagnosis not present

## 2015-07-17 DIAGNOSIS — M5126 Other intervertebral disc displacement, lumbar region: Secondary | ICD-10-CM | POA: Diagnosis present

## 2015-07-17 DIAGNOSIS — Z79899 Other long term (current) drug therapy: Secondary | ICD-10-CM | POA: Insufficient documentation

## 2015-07-17 DIAGNOSIS — Z89511 Acquired absence of right leg below knee: Secondary | ICD-10-CM | POA: Diagnosis not present

## 2015-07-17 DIAGNOSIS — M21372 Foot drop, left foot: Secondary | ICD-10-CM | POA: Diagnosis not present

## 2015-07-17 DIAGNOSIS — Z9049 Acquired absence of other specified parts of digestive tract: Secondary | ICD-10-CM | POA: Diagnosis not present

## 2015-07-17 DIAGNOSIS — K219 Gastro-esophageal reflux disease without esophagitis: Secondary | ICD-10-CM | POA: Insufficient documentation

## 2015-07-17 DIAGNOSIS — M48062 Spinal stenosis, lumbar region with neurogenic claudication: Secondary | ICD-10-CM | POA: Diagnosis present

## 2015-07-17 DIAGNOSIS — Z87891 Personal history of nicotine dependence: Secondary | ICD-10-CM | POA: Insufficient documentation

## 2015-07-17 DIAGNOSIS — Z419 Encounter for procedure for purposes other than remedying health state, unspecified: Secondary | ICD-10-CM

## 2015-07-17 HISTORY — PX: LUMBAR LAMINECTOMY: SHX95

## 2015-07-17 LAB — TYPE AND SCREEN
ABO/RH(D): O POS
Antibody Screen: NEGATIVE

## 2015-07-17 SURGERY — MICRODISCECTOMY LUMBAR LAMINECTOMY
Anesthesia: General | Site: Back | Laterality: Left

## 2015-07-17 MED ORDER — LACTATED RINGERS IV SOLN
INTRAVENOUS | Status: DC
  Administered 2015-07-17 (×2): via INTRAVENOUS
  Administered 2015-07-17: 1000 mL via INTRAVENOUS

## 2015-07-17 MED ORDER — BUPIVACAINE LIPOSOME 1.3 % IJ SUSP
20.0000 mL | Freq: Once | INTRAMUSCULAR | Status: DC
  Filled 2015-07-17: qty 20

## 2015-07-17 MED ORDER — BUPIVACAINE LIPOSOME 1.3 % IJ SUSP
INTRAMUSCULAR | Status: DC | PRN
  Administered 2015-07-17: 20 mL

## 2015-07-17 MED ORDER — ONDANSETRON HCL 4 MG/2ML IJ SOLN
INTRAMUSCULAR | Status: DC | PRN
  Administered 2015-07-17: 4 mg via INTRAVENOUS

## 2015-07-17 MED ORDER — BACITRACIN-NEOMYCIN-POLYMYXIN 400-5-5000 EX OINT
TOPICAL_OINTMENT | CUTANEOUS | Status: AC
  Filled 2015-07-17: qty 1

## 2015-07-17 MED ORDER — BISACODYL 5 MG PO TBEC: 5.0000 mg | DELAYED_RELEASE_TABLET | Freq: Every day | ORAL | Status: DC | PRN

## 2015-07-17 MED ORDER — OXYCODONE HCL 5 MG PO TABS: 5.0000 mg | ORAL_TABLET | ORAL | Status: DC | PRN

## 2015-07-17 MED ORDER — METHOCARBAMOL 500 MG PO TABS
500.0000 mg | ORAL_TABLET | Freq: Four times a day (QID) | ORAL | Status: DC | PRN
  Administered 2015-07-17 – 2015-07-18 (×3): 500 mg via ORAL
  Filled 2015-07-17 (×3): qty 1

## 2015-07-17 MED ORDER — ROCURONIUM BROMIDE 100 MG/10ML IV SOLN
INTRAVENOUS | Status: DC | PRN
  Administered 2015-07-17: 10 mg via INTRAVENOUS
  Administered 2015-07-17: 50 mg via INTRAVENOUS
  Administered 2015-07-17: 10 mg via INTRAVENOUS

## 2015-07-17 MED ORDER — LACTATED RINGERS IV SOLN: INTRAVENOUS | Status: DC

## 2015-07-17 MED ORDER — PHENOL 1.4 % MT LIQD
1.0000 | OROMUCOSAL | Status: DC | PRN
  Filled 2015-07-17: qty 177

## 2015-07-17 MED ORDER — BUPIVACAINE-EPINEPHRINE (PF) 0.5% -1:200000 IJ SOLN
INTRAMUSCULAR | Status: AC
  Filled 2015-07-17: qty 30

## 2015-07-17 MED ORDER — ACETAMINOPHEN 650 MG RE SUPP: 650.0000 mg | RECTAL | Status: DC | PRN

## 2015-07-17 MED ORDER — ACETAMINOPHEN 160 MG/5ML PO SOLN: 325.0000 mg | ORAL | Status: DC | PRN

## 2015-07-17 MED ORDER — LACTATED RINGERS IV SOLN
INTRAVENOUS | Status: DC
  Administered 2015-07-17: 20:00:00 via INTRAVENOUS

## 2015-07-17 MED ORDER — HYDROMORPHONE HCL 1 MG/ML IJ SOLN
INTRAMUSCULAR | Status: AC
  Filled 2015-07-17: qty 1

## 2015-07-17 MED ORDER — SODIUM CHLORIDE 0.9 % IR SOLN
Status: DC | PRN
  Administered 2015-07-17: 500 mL

## 2015-07-17 MED ORDER — PHENYLEPHRINE HCL 10 MG/ML IJ SOLN
INTRAMUSCULAR | Status: DC | PRN
  Administered 2015-07-17: 100 ug via INTRAVENOUS
  Administered 2015-07-17 (×3): 40 ug via INTRAVENOUS
  Administered 2015-07-17 (×4): 80 ug via INTRAVENOUS
  Administered 2015-07-17: 40 ug via INTRAVENOUS
  Administered 2015-07-17 (×2): 100 ug via INTRAVENOUS

## 2015-07-17 MED ORDER — ACETAMINOPHEN 325 MG PO TABS: 325.0000 mg | ORAL_TABLET | ORAL | Status: DC | PRN

## 2015-07-17 MED ORDER — SODIUM CHLORIDE 0.9 % IJ SOLN
INTRAMUSCULAR | Status: AC
  Filled 2015-07-17: qty 20

## 2015-07-17 MED ORDER — ACETAMINOPHEN 325 MG PO TABS: 650.0000 mg | ORAL_TABLET | ORAL | Status: DC | PRN

## 2015-07-17 MED ORDER — OXYCODONE HCL 5 MG PO TABS
ORAL_TABLET | ORAL | Status: AC
  Filled 2015-07-17: qty 1

## 2015-07-17 MED ORDER — LIDOCAINE HCL (CARDIAC) 20 MG/ML IV SOLN
INTRAVENOUS | Status: AC
  Filled 2015-07-17: qty 5

## 2015-07-17 MED ORDER — ONDANSETRON HCL 4 MG/2ML IJ SOLN: 4.0000 mg | INTRAMUSCULAR | Status: DC | PRN

## 2015-07-17 MED ORDER — OXYCODONE-ACETAMINOPHEN 10-325 MG PO TABS: 1.0000 | ORAL_TABLET | ORAL | Status: DC | PRN

## 2015-07-17 MED ORDER — OXYCODONE-ACETAMINOPHEN 5-325 MG PO TABS
1.0000 | ORAL_TABLET | ORAL | Status: DC | PRN
Start: 2015-07-17 — End: 2015-07-18

## 2015-07-17 MED ORDER — BACITRACIN-NEOMYCIN-POLYMYXIN OINTMENT TUBE
TOPICAL_OINTMENT | CUTANEOUS | Status: DC | PRN
  Administered 2015-07-17: 1 via TOPICAL

## 2015-07-17 MED ORDER — SCOPOLAMINE 1 MG/3DAYS TD PT72
MEDICATED_PATCH | TRANSDERMAL | Status: AC
Start: 2015-07-17 — End: 2015-07-17
  Filled 2015-07-17: qty 1

## 2015-07-17 MED ORDER — CHLORHEXIDINE GLUCONATE 4 % EX LIQD: 60.0000 mL | Freq: Once | CUTANEOUS | Status: DC

## 2015-07-17 MED ORDER — ROCURONIUM BROMIDE 100 MG/10ML IV SOLN
INTRAVENOUS | Status: AC
  Filled 2015-07-17: qty 1

## 2015-07-17 MED ORDER — CEFAZOLIN SODIUM 1-5 GM-% IV SOLN
1.0000 g | Freq: Three times a day (TID) | INTRAVENOUS | Status: DC
  Administered 2015-07-17 – 2015-07-18 (×2): 1 g via INTRAVENOUS
  Filled 2015-07-17 (×3): qty 50

## 2015-07-17 MED ORDER — DEXAMETHASONE SODIUM PHOSPHATE 10 MG/ML IJ SOLN
INTRAMUSCULAR | Status: DC | PRN
  Administered 2015-07-17: 10 mg via INTRAVENOUS

## 2015-07-17 MED ORDER — PROPOFOL 10 MG/ML IV BOLUS
INTRAVENOUS | Status: AC
  Filled 2015-07-17: qty 20

## 2015-07-17 MED ORDER — MIDAZOLAM HCL 5 MG/5ML IJ SOLN
INTRAMUSCULAR | Status: DC | PRN
  Administered 2015-07-17 (×2): 2 mg via INTRAVENOUS

## 2015-07-17 MED ORDER — HEMOSTATIC AGENTS (NO CHARGE) OPTIME
TOPICAL | Status: DC | PRN
  Administered 2015-07-17: 1 via TOPICAL

## 2015-07-17 MED ORDER — BUPIVACAINE-EPINEPHRINE 0.5% -1:200000 IJ SOLN
INTRAMUSCULAR | Status: DC | PRN
  Administered 2015-07-17: 20 mL

## 2015-07-17 MED ORDER — HYDROMORPHONE HCL 1 MG/ML IJ SOLN
0.5000 mg | INTRAMUSCULAR | Status: DC | PRN
Start: 2015-07-17 — End: 2015-07-18
  Administered 2015-07-17 (×2): 1 mg via INTRAVENOUS
  Filled 2015-07-17 (×2): qty 1

## 2015-07-17 MED ORDER — FLEET ENEMA 7-19 GM/118ML RE ENEM: 1.0000 | ENEMA | Freq: Once | RECTAL | Status: DC | PRN

## 2015-07-17 MED ORDER — SCOPOLAMINE 1 MG/3DAYS TD PT72
MEDICATED_PATCH | TRANSDERMAL | Status: DC | PRN
  Administered 2015-07-17: 1 via TRANSDERMAL

## 2015-07-17 MED ORDER — MIDAZOLAM HCL 2 MG/2ML IJ SOLN
INTRAMUSCULAR | Status: AC
  Filled 2015-07-17: qty 2

## 2015-07-17 MED ORDER — EPHEDRINE SULFATE 50 MG/ML IJ SOLN
INTRAMUSCULAR | Status: DC | PRN
  Administered 2015-07-17: 10 mg via INTRAVENOUS
  Administered 2015-07-17: 5 mg via INTRAVENOUS

## 2015-07-17 MED ORDER — HYDROCODONE-ACETAMINOPHEN 5-325 MG PO TABS
1.0000 | ORAL_TABLET | ORAL | Status: DC | PRN
  Administered 2015-07-17 – 2015-07-18 (×5): 2 via ORAL
  Filled 2015-07-17 (×5): qty 2

## 2015-07-17 MED ORDER — HYDROMORPHONE HCL 1 MG/ML IJ SOLN
0.2500 mg | INTRAMUSCULAR | Status: DC | PRN
  Administered 2015-07-17 (×2): 0.25 mg via INTRAVENOUS

## 2015-07-17 MED ORDER — SUCCINYLCHOLINE CHLORIDE 20 MG/ML IJ SOLN
INTRAMUSCULAR | Status: DC | PRN
  Administered 2015-07-17: 60 mg via INTRAVENOUS

## 2015-07-17 MED ORDER — ONDANSETRON HCL 4 MG/2ML IJ SOLN
INTRAMUSCULAR | Status: AC
  Filled 2015-07-17: qty 2

## 2015-07-17 MED ORDER — CEFAZOLIN SODIUM-DEXTROSE 2-4 GM/100ML-% IV SOLN
INTRAVENOUS | Status: AC
  Filled 2015-07-17: qty 100

## 2015-07-17 MED ORDER — FENTANYL CITRATE (PF) 100 MCG/2ML IJ SOLN
INTRAMUSCULAR | Status: DC | PRN
  Administered 2015-07-17: 150 ug via INTRAVENOUS
  Administered 2015-07-17: 100 ug via INTRAVENOUS

## 2015-07-17 MED ORDER — OXYCODONE HCL 5 MG PO TABS
5.0000 mg | ORAL_TABLET | Freq: Once | ORAL | Status: AC | PRN
  Administered 2015-07-17: 5 mg via ORAL

## 2015-07-17 MED ORDER — LIDOCAINE HCL (CARDIAC) 20 MG/ML IV SOLN
INTRAVENOUS | Status: DC | PRN
  Administered 2015-07-17: 60 mg via INTRAVENOUS

## 2015-07-17 MED ORDER — OXYCODONE HCL 5 MG/5ML PO SOLN: 5.0000 mg | Freq: Once | ORAL | Status: AC | PRN

## 2015-07-17 MED ORDER — SUGAMMADEX SODIUM 200 MG/2ML IV SOLN
INTRAVENOUS | Status: DC | PRN
  Administered 2015-07-17: 200 mg via INTRAVENOUS

## 2015-07-17 MED ORDER — METHOCARBAMOL 1000 MG/10ML IJ SOLN
500.0000 mg | Freq: Four times a day (QID) | INTRAVENOUS | Status: DC | PRN
  Administered 2015-07-17: 500 mg via INTRAVENOUS
  Filled 2015-07-17 (×2): qty 5

## 2015-07-17 MED ORDER — POLYETHYLENE GLYCOL 3350 17 G PO PACK: 17.0000 g | PACK | Freq: Every day | ORAL | Status: DC | PRN

## 2015-07-17 MED ORDER — DEXAMETHASONE SODIUM PHOSPHATE 10 MG/ML IJ SOLN
INTRAMUSCULAR | Status: AC
  Filled 2015-07-17: qty 1

## 2015-07-17 MED ORDER — FENTANYL CITRATE (PF) 250 MCG/5ML IJ SOLN
INTRAMUSCULAR | Status: AC
  Filled 2015-07-17: qty 5

## 2015-07-17 MED ORDER — CEFAZOLIN SODIUM-DEXTROSE 2-4 GM/100ML-% IV SOLN
2.0000 g | INTRAVENOUS | Status: AC
  Administered 2015-07-17: 2 g via INTRAVENOUS

## 2015-07-17 MED ORDER — PROPOFOL 10 MG/ML IV BOLUS
INTRAVENOUS | Status: DC | PRN
  Administered 2015-07-17: 130 mg via INTRAVENOUS

## 2015-07-17 MED ORDER — MENTHOL 3 MG MT LOZG: 1.0000 | LOZENGE | OROMUCOSAL | Status: DC | PRN

## 2015-07-17 MED ORDER — POLYMYXIN B SULFATE 500000 UNITS IJ SOLR
INTRAMUSCULAR | Status: AC
  Filled 2015-07-17: qty 1

## 2015-07-17 MED ORDER — SUMATRIPTAN SUCCINATE 100 MG PO TABS
100.0000 mg | ORAL_TABLET | ORAL | Status: DC | PRN
Start: 2015-07-17 — End: 2015-07-18
  Filled 2015-07-17: qty 1

## 2015-07-17 MED ORDER — METHOCARBAMOL 500 MG PO TABS: 500.0000 mg | ORAL_TABLET | Freq: Four times a day (QID) | ORAL | Status: DC | PRN

## 2015-07-17 MED ORDER — SUGAMMADEX SODIUM 200 MG/2ML IV SOLN
INTRAVENOUS | Status: AC
  Filled 2015-07-17: qty 2

## 2015-07-17 SURGICAL SUPPLY — 52 items
BAG ZIPLOCK 12X15 (MISCELLANEOUS) ×3 IMPLANT
BENZOIN TINCTURE PRP APPL 2/3 (GAUZE/BANDAGES/DRESSINGS) ×3 IMPLANT
BLADE SURG SZ10 CARB STEEL (BLADE) IMPLANT
CLEANER TIP ELECTROSURG 2X2 (MISCELLANEOUS) ×3 IMPLANT
CLOSURE WOUND 1/2 X4 (GAUZE/BANDAGES/DRESSINGS) ×1
DRAIN PENROSE 18X1/4 LTX STRL (WOUND CARE) IMPLANT
DRAPE MICROSCOPE LEICA (MISCELLANEOUS) ×3 IMPLANT
DRAPE POUCH INSTRU U-SHP 10X18 (DRAPES) ×3 IMPLANT
DRAPE SHEET LG 3/4 BI-LAMINATE (DRAPES) ×6 IMPLANT
DRAPE SURG 17X11 SM STRL (DRAPES) ×3 IMPLANT
DRSG ADAPTIC 3X8 NADH LF (GAUZE/BANDAGES/DRESSINGS) ×3 IMPLANT
DRSG PAD ABDOMINAL 8X10 ST (GAUZE/BANDAGES/DRESSINGS) ×3 IMPLANT
DURAPREP 26ML APPLICATOR (WOUND CARE) ×3 IMPLANT
ELECT REM PT RETURN 9FT ADLT (ELECTROSURGICAL) ×3
ELECTRODE REM PT RTRN 9FT ADLT (ELECTROSURGICAL) ×1 IMPLANT
GLOVE BIOGEL PI IND STRL 6.5 (GLOVE) ×1 IMPLANT
GLOVE BIOGEL PI IND STRL 7.5 (GLOVE) ×2 IMPLANT
GLOVE BIOGEL PI IND STRL 8 (GLOVE) ×1 IMPLANT
GLOVE BIOGEL PI INDICATOR 6.5 (GLOVE) ×2
GLOVE BIOGEL PI INDICATOR 7.5 (GLOVE) ×4
GLOVE BIOGEL PI INDICATOR 8 (GLOVE) ×2
GLOVE ECLIPSE 8.0 STRL XLNG CF (GLOVE) ×6 IMPLANT
GLOVE SURG SS PI 6.5 STRL IVOR (GLOVE) ×3 IMPLANT
GLOVE SURG SS PI 7.5 STRL IVOR (GLOVE) ×3 IMPLANT
GOWN STRL REUS W/TWL LRG LVL3 (GOWN DISPOSABLE) ×3 IMPLANT
GOWN STRL REUS W/TWL XL LVL3 (GOWN DISPOSABLE) ×6 IMPLANT
HEMOSTAT SPONGE AVITENE ULTRA (HEMOSTASIS) ×3 IMPLANT
KIT BASIN OR (CUSTOM PROCEDURE TRAY) ×3 IMPLANT
KIT POSITIONING SURG ANDREWS (MISCELLANEOUS) ×3 IMPLANT
MANIFOLD NEPTUNE II (INSTRUMENTS) ×3 IMPLANT
MARKER SKIN DUAL TIP RULER LAB (MISCELLANEOUS) ×3 IMPLANT
NEEDLE HYPO 22GX1.5 SAFETY (NEEDLE) ×3 IMPLANT
NEEDLE SPNL 18GX3.5 QUINCKE PK (NEEDLE) ×9 IMPLANT
PACK LAMINECTOMY ORTHO (CUSTOM PROCEDURE TRAY) ×3 IMPLANT
PAD ABD 8X10 STRL (GAUZE/BANDAGES/DRESSINGS) ×3 IMPLANT
PATTIES SURGICAL .5 X.5 (GAUZE/BANDAGES/DRESSINGS) IMPLANT
PATTIES SURGICAL .75X.75 (GAUZE/BANDAGES/DRESSINGS) IMPLANT
PATTIES SURGICAL 1X1 (DISPOSABLE) IMPLANT
PIN SAFETY NICK PLATE  2 MED (MISCELLANEOUS)
PIN SAFETY NICK PLATE 2 MED (MISCELLANEOUS) IMPLANT
POSITIONER SURGICAL ARM (MISCELLANEOUS) ×3 IMPLANT
SPONGE LAP 4X18 X RAY DECT (DISPOSABLE) IMPLANT
STAPLER VISISTAT 35W (STAPLE) IMPLANT
STRIP CLOSURE SKIN 1/2X4 (GAUZE/BANDAGES/DRESSINGS) ×2 IMPLANT
SUT VIC AB 0 CT1 27 (SUTURE) ×2
SUT VIC AB 0 CT1 27XBRD ANTBC (SUTURE) ×1 IMPLANT
SUT VIC AB 1 CT1 27 (SUTURE) ×6
SUT VIC AB 1 CT1 27XBRD ANTBC (SUTURE) ×3 IMPLANT
SUT VIC AB 2-0 CT1 27 (SUTURE) ×4
SUT VIC AB 2-0 CT1 TAPERPNT 27 (SUTURE) ×2 IMPLANT
TAPE CLOTH SURG 4X10 WHT LF (GAUZE/BANDAGES/DRESSINGS) ×3 IMPLANT
TOWEL OR 17X26 10 PK STRL BLUE (TOWEL DISPOSABLE) ×6 IMPLANT

## 2015-07-17 NOTE — Transfer of Care (Signed)
Immediate Anesthesia Transfer of Care Note  Patient: Michelle Contreras  Procedure(s) Performed: Procedure(s): MICRODISCECTOMY LUMBAR LAMINECTOMY  L4-L5 ON LEFT, CENTRAL DECOMPRESSION L4-L5 FOR SPINAL STENOSIS, FORAMINOTOMY L4-L5 (Left)  Patient Location: PACU  Anesthesia Type:General  Level of Consciousness: awake and alert   Airway & Oxygen Therapy: Patient connected to face mask oxygen  Post-op Assessment: Report given to RN  Post vital signs: Reviewed  Last Vitals:  Filed Vitals:   07/17/15 0827  BP: 105/68  Pulse: 79  Temp: 36.9 C  Resp: 18    Last Pain: There were no vitals filed for this visit.    Patients Stated Pain Goal: 4 ([id number])  Complications: No apparent anesthesia complications

## 2015-07-17 NOTE — Interval H&P Note (Signed)
History and Physical Interval Note:  07/17/2015 9:49 AM  Michelle Contreras  has presented today for surgery, with the diagnosis of herniated lumbar disc L4-L5 left  The various methods of treatment have been discussed with the patient and family. After consideration of risks, benefits and other options for treatment, the patient has consented to  Procedure(s): MICRODISCECTOMY LUMBAR LAMINECTOMY  L4-L5 (Left) as a surgical intervention .  The patient's history has been reviewed, patient examined, no change in status, stable for surgery.  I have reviewed the patient's chart and labs.  Questions were answered to the patient's satisfaction.     [place] Karl A

## 2015-07-17 NOTE — Anesthesia Procedure Notes (Signed)
Procedure Name: Intubation Date/Time: 07/17/2015 10:30 AM Performed by: Gaston Islam EVETTE Pre-anesthesia Checklist: Patient identified, Suction available and Patient being monitored Patient Re-evaluated:Patient Re-evaluated prior to inductionOxygen Delivery Method: Circle system utilized Preoxygenation: Pre-oxygenation with 100% oxygen Intubation Type: IV induction Ventilation: Mask ventilation without difficulty Laryngoscope Size: Mac and 4 Grade View: Grade I Tube type: Oral Tube size: 7.5 mm Number of attempts: 1 Airway Equipment and Method: Patient positioned with wedge pillow and Stylet Placement Confirmation: ETT inserted through vocal cords under direct vision,  positive ETCO2,  CO2 detector and breath sounds checked- equal and bilateral Secured at: 22 cm Tube secured with: Tape Dental Injury: Teeth and Oropharynx as per pre-operative assessment

## 2015-07-17 NOTE — Discharge Instructions (Signed)
For the first fhree days, remove your dressing, tape a piece of saran wrap over your incision. Take your shower, then remove the saran wrap and put a clean dressing on. No lotions or ointments over the incision.  After three days you can shower without the saran wrap.  No lifting or bending.  No driving when taking pain medications.  Call Dr. Gladstone Lighter if any wound complications or temperature of 101 degrees F or over.  Call the office for an appointment to see Dr. Gladstone Lighter in two weeks: [phone] and ask for Dr. Charlestine Night nurse, Brunilda Payor.

## 2015-07-17 NOTE — Anesthesia Preprocedure Evaluation (Signed)
Anesthesia Evaluation  Patient identified by MRN, date of birth, ID band Patient awake    Reviewed: Allergy & Precautions, NPO status , Patient's Chart, lab work & pertinent test results  History of Anesthesia Complications (+) PONV and history of anesthetic complications  Airway Mallampati: II  TM Distance: >3 FB Neck ROM: Full    Dental  (+) Teeth Intact   Pulmonary neg shortness of breath, neg sleep apnea, neg COPD, neg recent URI, former smoker, neg PE   breath sounds clear to auscultation       Cardiovascular negative cardio ROS   Rhythm:Regular     Neuro/Psych  Headaches, neg Seizures Left leg symptoms  Neuromuscular disease negative psych ROS   GI/Hepatic Neg liver ROS, GERD  Medicated and Controlled,  Endo/Other  negative endocrine ROS  Renal/GU negative Renal ROS     Musculoskeletal  (+) Arthritis ,   Abdominal   Peds  Hematology negative hematology ROS (+)   Anesthesia Other Findings   Reproductive/Obstetrics                             Anesthesia Physical Anesthesia Plan  ASA: II  Anesthesia Plan: General   Post-op Pain Management:    Induction: Intravenous  Airway Management Planned: Oral ETT  Additional Equipment: None  Intra-op Plan:   Post-operative Plan: Extubation in OR  Informed Consent: I have reviewed the patients History and Physical, chart, labs and discussed the procedure including the risks, benefits and alternatives for the proposed anesthesia with the patient or authorized representative who has indicated his/her understanding and acceptance.   Dental advisory given  Plan Discussed with: CRNA and Surgeon  Anesthesia Plan Comments:         Anesthesia Quick Evaluation

## 2015-07-17 NOTE — Brief Op Note (Signed)
07/17/2015  12:21 PM  PATIENT:  Michelle Contreras  55 y.o. female  PRE-OPERATIVE DIAGNOSIS:  herniated lumbar disc L4-L5 left and Spinal Stenosis and Foraminal Stenosis involving the L-4 and L-5 Nerve Roots.Partial Foot Drop on the Left.  POST-OPERATIVE DIAGNOSIS:  Same as Pre-Op  PROCEDURE:  Procedure(s): MICRODISCECTOMY LUMBAR LAMINECTOMY  L4-L5 ON LEFT, CENTRAL DECOMPRESSION L4-L5 FOR SPINAL STENOSIS, FORAMINOTOMY L4-L5 (Left)  SURGEON:  Surgeon(s) and Role:    * Latanya Maudlin, MD - Primary  PHYSICIAN ASSISTANT:Amber [place] PA   ASSISTANTS: Ardeen Jourdain PA  ANESTHESIA:   general  EBL:  Total I/O In: 1000 [I.V.:1000] Out: 50 [Blood:50]  BLOOD ADMINISTERED:none  DRAINS: none   LOCAL MEDICATIONS USED:  MARCAINE  20cc of 0.50% with Epinephrine at start of case and 20cc of Exparel at the end of the case.   SPECIMEN:  Source of Specimen:  L-4-L-5 interspace.  DISPOSITION OF SPECIMEN:  PATHOLOGY  COUNTS:  YES  TOURNIQUET:  * No tourniquets in log *  DICTATION: .Other Dictation: Dictation Number [id number]  PLAN OF CARE: Admit for overnight observation  PATIENT DISPOSITION:  PACU - hemodynamically stable.   Delay start of Pharmacological VTE agent (>24hrs) due to surgical blood loss or risk of bleeding: yes

## 2015-07-17 NOTE — Op Note (Signed)
[name], [name] NO.:  [id number]  MEDICAL RECORD NO.:  [id number]  LOCATION:  67                         FACILITY:  [facility]  PHYSICIAN:  Kipp Brood. Surabhi Gadea, M.D.DATE OF BIRTH:  05-28-1960  DATE OF PROCEDURE:  07/17/2015 DATE OF DISCHARGE:                              OPERATIVE REPORT   SURGEON:  Kipp Brood. Gladstone Lighter, M.D.  ASSISTANT:  Michelle Contreras, [place].  PREOPERATIVE DIAGNOSES: 1. Spinal stenosis at L4-5. 2. Foraminal stenosis for the L4 root. 3. Foraminal stenosis involving the L5 root. 4. Herniated lumbar disk with a foraminal component at L4-5 on the     left. 5. Partial footdrop on the left.  POSTOPERATIVE DIAGNOSES: 1. Spinal stenosis at L4-5. 2. Foraminal stenosis for the L4 root. 3. Foraminal stenosis involving the L5 root. 4. Herniated lumbar disk with a foraminal component at L4-5 on the     left. 5. Partial footdrop on the left.  OPERATIONS: 1. Central decompressive lumbar laminectomy at L4-5 with spinal     stenosis. 2. Microdiskectomy at L4-5 on the left for herniated lumbar disk. 3. Foraminotomies for the L4 root on the left. 4. Foraminotomy for the L5 root on the left.  DESCRIPTION OF PROCEDURE:  Under general anesthesia, routine orthopedic prep and draping was carried out with the patient on spinal frame. Note, the appropriate time-out was first carried out.  I also marked the appropriate left side of her back in the holding area.  Following that, at this particular time, two needles were placed in the back for localization purposes.  X-ray was taken.  I then removed the needles and injected 20 mL of 0.5% Marcaine and epinephrine on each side of the spinous processes for bleeding control.  At that time, an incision then was made over the L4-5 space.  The incision was extended distally and proximally.  Note, she is allergic to adhesive from the drapes, so, no adhesive from the drapes touch to skin.  The only thing we had on  was the Vi-Drape.  Following the incision, we then separated the muscle from the spinous processes and transverse processes bilaterally.  We had good control of the bleeding.  At this time, two Kocher clamps were placed in place and x-ray was taken.  We then began our dissection.  We removed the portion of the spinous process of L4.  We then went down centrally and did a decompression at L4-5 after the [facility] retractors were inserted.  At this time, the microscope was brought in.  We identified the ligamentum flavum, but it was extremely thickened.  We opened the ligamentum flavum, protected the underlying dura with the cottonoids. We then removed the ligamentum flavum in the usual fashion.  I then gently retracted the dura, went out laterally and decompressed the lateral recess as well.  We protected the dura at all times.  I then went out distally and did a foraminotomy distally and proximally.  We then took a second x-ray and then finally a third x-ray with the needle and the disk space that was labeled at L4-5.  We gently retracted the dura.  We identified the nerve root, which was a little scarred down  to the ligamentum flavum, but we gently retracted that.  We cauterized the lateral recess veins, as I said, the microscope was used.  The needle was placed into the disk space and x-ray was taken.  I then removed that with care not to injure the dura or nerve.  We then made a cruciate incision in the posterior longitudinal ligament, carried out our diskectomy.  I inserted a nerve hook to tease out some disk material.  I also utilized the Epstein curettes to go out into the foramen and all around the area to make sure that the disk material was now compressed into this space and removed.  When this was completed, we examined the foramen above and below.  We were able to easily pass a hockey-stick out the foramen for the 5 root and 4 root.  We were able to easily mobilize the root  and the dura.  We thoroughly irrigated out the area, loosely applied some Gelfoam.  This with the Avitene Gelfoam.  Following that, we closed the wound layers in usual fashion.  I left the small deep distal and proximal part of the wound open for drainage purposes.  I injected 20 mL of Exparel into the soft tissue.  We then closed the remaining part of the wound in usual fashion.  Sterile dressings were applied.  She had 2 g of IV Ancef preop.  Also note this patient had a previous history of a sarcoma and had a prior below-the-knee amputation done on the right.          ______________________________ Kipp Brood. Gladstone Lighter, M.D.     RAG/MEDQ  D:  07/17/2015  T:  07/17/2015  Job:  [id number]

## 2015-07-17 NOTE — Interval H&P Note (Signed)
History and Physical Interval Note:  07/17/2015 9:48 AM  Michelle Contreras  has presented today for surgery, with the diagnosis of herniated lumbar disc L4-L5 left  The various methods of treatment have been discussed with the patient and family. After consideration of risks, benefits and other options for treatment, the patient has consented to  Procedure(s): MICRODISCECTOMY LUMBAR LAMINECTOMY  L4-L5 (Left) as a surgical intervention .  The patient's history has been reviewed, patient examined, no change in status, stable for surgery.  I have reviewed the patient's chart and labs.  Questions were answered to the patient's satisfaction.     Keelin Sheridan A

## 2015-07-18 DIAGNOSIS — M5126 Other intervertebral disc displacement, lumbar region: Secondary | ICD-10-CM | POA: Diagnosis not present

## 2015-07-18 NOTE — Progress Notes (Signed)
Subjective: 1 Day Post-Op Procedure(s) (LRB): MICRODISCECTOMY LUMBAR LAMINECTOMY  L4-L5 ON LEFT, CENTRAL DECOMPRESSION L4-L5 FOR SPINAL STENOSIS, FORAMINOTOMY L4-L5 (Left) Patient reports pain as 1 on 0-10 scale.  Doi9ng very well. Good strength in Left Foot.Wound looks dry and no signs of any skin reactions. Will Dc after PT.  Objective: Vital signs in last 24 hours: Temp:  [97.6 F (36.4 C)-98.8 F (37.1 C)] 98.3 F (36.8 C) (04/27 0524) Pulse Rate:  [70-117] 70 (04/27 0524) Resp:  [11-20] 18 (04/27 0524) BP: (90-116)/(50-70) 93/53 mmHg (04/27 0524) SpO2:  [96 %-100 %] 98 % (04/27 0524) Weight:  [72.576 kg (160 lb)] 72.576 kg (160 lb) (04/26 0827)  Intake/Output from previous day: 04/26 0701 - 04/27 0700 In: 3382 [P.O.:722; I.V.:2560; IV Piggyback:100] Out: 1550 [Urine:1500; Blood:50] Intake/Output this shift:    No results for input(s): HGB in the last 72 hours. No results for input(s): WBC, RBC, HCT, PLT in the last 72 hours. No results for input(s): NA, K, CL, CO2, BUN, CREATININE, GLUCOSE, CALCIUM in the last 72 hours. No results for input(s): LABPT, INR in the last 72 hours.  Neurologically intact Dorsiflexion/Plantar flexion intact No cellulitis present  Assessment/Plan: 1 Day Post-Op Procedure(s) (LRB): MICRODISCECTOMY LUMBAR LAMINECTOMY  L4-L5 ON LEFT, CENTRAL DECOMPRESSION L4-L5 FOR SPINAL STENOSIS, FORAMINOTOMY L4-L5 (Left) Up with therapy Discharge home with home health  Petrina Melby A 07/18/2015, 7:18 AM

## 2015-07-18 NOTE — Evaluation (Signed)
Physical Therapy Evaluation Patient Details Name: Michelle Contreras MRN: [id number] DOB: 02-Apr-1960 Today's Date: 07/18/2015   History of Present Illness  s/p MICRODISCECTOMY LUMBAR LAMINECTOMY L4-L5 ON LEFT, CENTRAL DECOMPRESSION L4-L5 FOR SPINAL STENOSIS, FORAMINOTOMY L4-L5 (Left)  Clinical Impression  Pt s/p back surgery presents with functional mobility limitations 2* post op discomfort and back precautions.  Pt educated on basic mobility tasks and post op back precautions and demonstrates good awareness of same.  Pt plans dc home this date with family assist.    Follow Up Recommendations No PT follow up    Equipment Recommendations  None recommended by PT    Recommendations for Other Services OT consult     Precautions / Restrictions Precautions Precautions: Back;Other (comment) Precaution Booklet Issued: Yes (comment) Precaution Comments: handout reviewed with patient Restrictions Weight Bearing Restrictions: No      Mobility  Bed Mobility Overal bed mobility: Needs Assistance Bed Mobility: Supine to Sit     Supine to sit: Min guard;Supervision Sit to supine: Supervision   General bed mobility comments: min cues for log roll technique OOB  Transfers Overall transfer level: Needs assistance Equipment used: None Transfers: Sit to/from Stand Sit to Stand: Min guard;Supervision         General transfer comment: cues for transition position and use of UES to self assist  Ambulation/Gait Ambulation/Gait assistance: Min guard;Supervision Ambulation Distance (Feet): 300 Feet Assistive device: Rolling walker (2 wheeled);None Gait Pattern/deviations: Step-through pattern;Shuffle Gait velocity: decr   General Gait Details: Pt ambulated 100' with RW and 200' sans AD with min change in stability  Stairs Stairs: Yes Stairs assistance: Min guard Stair Management: One rail Left;Two rails;Forwards;Alternating pattern Number of Stairs: 7 General stair comments: 5  stairs with bil rails and 2 stairs with doorframe and HHA  Wheelchair Mobility    Modified Rankin (Stroke Patients Only)       Balance Overall balance assessment: No apparent balance deficits (not formally assessed)                                           Pertinent Vitals/Pain Pain Assessment: No/denies pain    Home Living Family/patient expects to be discharged to:: Private residence Living Arrangements: Spouse/significant other Available Help at Discharge: Family;Available 24 hours/day Type of Home: House Home Access: Stairs to enter Entrance Stairs-Rails: None Entrance Stairs-Number of Steps: 2 Home Layout: Two level Home Equipment: Other (comment);Shower seat;Grab bars - tub/shower;Walker - 4 wheels Additional Comments: also has R LE prosthesis    Prior Function Level of Independence: Independent with assistive device(s)         Comments: Used cane     Hand Dominance   Dominant Hand: Right    Extremity/Trunk Assessment   Upper Extremity Assessment: Overall WFL for tasks assessed           Lower Extremity Assessment: RLE deficits/detail RLE Deficits / Details: BKA    Cervical / Trunk Assessment: Normal  Communication   Communication: No difficulties  Cognition Arousal/Alertness: Awake/alert Behavior During Therapy: WFL for tasks assessed/performed Overall Cognitive Status: Within Functional Limits for tasks assessed                      General Comments      Exercises        Assessment/Plan    PT Assessment Patient needs continued PT services  PT Diagnosis  Difficulty walking   PT Problem List Decreased activity tolerance;Decreased mobility;Decreased knowledge of precautions  PT Treatment Interventions DME instruction;Gait training;Stair training;Functional mobility training;Therapeutic activities;Patient/family education   PT Goals (Current goals can be found in the Care Plan section) Acute Rehab PT  Goals Patient Stated Goal: to go home PT Goal Formulation: All assessment and education complete, DC therapy    Frequency Min 1X/week   Barriers to discharge        Co-evaluation               End of Session   Activity Tolerance: Patient tolerated treatment well Patient left: in chair Nurse Communication: Mobility status    Functional Assessment Tool Used: Clinical Judgement Functional Limitation: Mobility: Walking and moving around Mobility: Walking and Moving Around Current Status [id number]): At least 1 percent but less than 20 percent impaired, limited or restricted Mobility: Walking and Moving Around Goal Status [phone]): At least 1 percent but less than 20 percent impaired, limited or restricted Mobility: Walking and Moving Around Discharge Status [phone]): At least 1 percent but less than 20 percent impaired, limited or restricted    Time: 0840-0902 PT Time Calculation (min) (ACUTE ONLY): 22 min   Charges:   PT Evaluation $PT Eval Low Complexity: 1 Procedure     PT G Codes:   PT G-Codes **NOT FOR INPATIENT CLASS** Functional Assessment Tool Used: Clinical Judgement Functional Limitation: Mobility: Walking and moving around Mobility: Walking and Moving Around Current Status [id number]): At least 1 percent but less than 20 percent impaired, limited or restricted Mobility: Walking and Moving Around Goal Status [phone]): At least 1 percent but less than 20 percent impaired, limited or restricted Mobility: Walking and Moving Around Discharge Status [phone]): At least 1 percent but less than 20 percent impaired, limited or restricted    Braelon Sprung 07/18/2015, 12:18 PM

## 2015-07-18 NOTE — Progress Notes (Signed)
Occupational Therapy Evaluation Patient Details Name: Michelle Contreras MRN: [id number] DOB: Jun 01, 1960 Today's Date: 07/18/2015    History of Present Illness s/p MICRODISCECTOMY LUMBAR LAMINECTOMY L4-L5 ON LEFT, CENTRAL DECOMPRESSION L4-L5 FOR SPINAL STENOSIS, FORAMINOTOMY L4-L5 (Left)   Clinical Impression   All OT education completed and pt questions answered. No further OT needs. Will sign off.    Follow Up Recommendations  No OT follow up;Supervision/Assistance - 24 hour    Equipment Recommendations  None recommended by OT    Recommendations for Other Services       Precautions / Restrictions Precautions Precautions: Back;Other (comment) (R LE prosthesis) Precaution Booklet Issued: Yes (comment) Precaution Comments: handout reviewed with patient Restrictions Weight Bearing Restrictions: No      Mobility Bed Mobility Overal bed mobility: Needs Assistance Bed Mobility: Sit to Supine       Sit to supine: Supervision   General bed mobility comments: was able to perform log roll technique back into bed without cues  Transfers Overall transfer level: Needs assistance   Transfers: Sit to/from Stand Sit to Stand: Supervision              Balance                                            ADL Overall ADL's : Needs assistance/impaired Eating/Feeding: Independent   Grooming: Set up;Supervision/safety   Upper Body Bathing: Set up;Sitting   Lower Body Bathing: Minimal assistance;Sit to/from stand   Upper Body Dressing : Set up;Sitting   Lower Body Dressing: Minimal assistance;Bed level;Sit to/from stand   Toilet Transfer: Supervision/safety;Ambulation;Regular Toilet;Grab bars   Toileting- Clothing Manipulation and Hygiene: Supervision/safety;Sit to/from stand       Functional mobility during ADLs: Supervision/safety General ADL Comments: Patient educated on LB bathing/dressing techniques, walk-in shower transfer (verbal education  only; husband to assist at discharge and pt declined to practice); toilet transfer and toileting. Also educated on reacher and where to purchase. Back to bed at end of session.     Vision     Perception     Praxis      Pertinent Vitals/Pain Pain Assessment: No/denies pain     Hand Dominance Right   Extremity/Trunk Assessment Upper Extremity Assessment Upper Extremity Assessment: Overall WFL for tasks assessed   Lower Extremity Assessment Lower Extremity Assessment: Defer to PT evaluation       Communication Communication Communication: No difficulties   Cognition Arousal/Alertness: Awake/alert Behavior During Therapy: WFL for tasks assessed/performed Overall Cognitive Status: Within Functional Limits for tasks assessed                     General Comments       Exercises       Shoulder Instructions      Home Living Family/patient expects to be discharged to:: Private residence Living Arrangements: Spouse/significant other Available Help at Discharge: Family;Available 24 hours/day               Bathroom Shower/Tub: Occupational psychologist: Standard     Home Equipment: Other (comment);Shower seat;Grab bars - tub/shower (toilet rails)   Additional Comments: also has R LE prosthesis      Prior Functioning/Environment Level of Independence: Independent with assistive device(s)             OT Diagnosis: Acute pain   OT Problem List: Decreased  activity tolerance;Decreased knowledge of use of DME or AE;Decreased knowledge of precautions;Pain   OT Treatment/Interventions:      OT Goals(Current goals can be found in the care plan section) Acute Rehab OT Goals Patient Stated Goal: to go home OT Goal Formulation: All assessment and education complete, DC therapy  OT Frequency:     Barriers to D/C:            Co-evaluation              End of Session Nurse Communication: Mobility status  Activity Tolerance: Patient  tolerated treatment well Patient left: in bed;with call bell/phone within reach;with family/visitor present   Time: [id number] OT Time Calculation (min): 17 min Charges:  OT General Charges $OT Visit: 1 Procedure OT Evaluation $OT Eval Low Complexity: 1 Procedure G-Codes: OT G-codes **NOT FOR INPATIENT CLASS** Functional Assessment Tool Used: clinical judgment Functional Limitation: Self care Self Care Current Status [id number]): At least 20 percent but less than 40 percent impaired, limited or restricted Self Care Goal Status [id number]): At least 20 percent but less than 40 percent impaired, limited or restricted Self Care Discharge Status [phone]): At least 20 percent but less than 40 percent impaired, limited or restricted  Olene Godfrey A 07/18/2015, 12:01 PM

## 2015-07-20 NOTE — Anesthesia Postprocedure Evaluation (Signed)
Anesthesia Post Note  Patient: Michelle Contreras  Procedure(s) Performed: Procedure(s) (LRB): MICRODISCECTOMY LUMBAR LAMINECTOMY  L4-L5 ON LEFT, CENTRAL DECOMPRESSION L4-L5 FOR SPINAL STENOSIS, FORAMINOTOMY L4-L5 (Left)  Patient location during evaluation: PACU Anesthesia Type: General Level of consciousness: awake Pain management: pain level controlled Vital Signs Assessment: post-procedure vital signs reviewed and stable Respiratory status: spontaneous breathing Cardiovascular status: stable Postop Assessment: no signs of nausea or vomiting Anesthetic complications: no    Last Vitals:  Filed Vitals:   07/18/15 0524 07/18/15 1038  BP: 93/53 93/51  Pulse: 70 65  Temp: 36.8 C 37.2 C  Resp: 18 16    Last Pain:  Filed Vitals:   07/18/15 1400  PainSc: 2                  Mame Twombly

## 2015-07-22 NOTE — Discharge Summary (Signed)
Physician Discharge Summary   Patient ID: Michelle Contreras MRN: [id number] DOB/AGE: 28-Feb-1961 55 y.o.  Admit date: 07/17/2015 Discharge date: 07/18/2015  Primary Diagnosis: Lumbar disc herniation L4-L5 left; Lumbar spinal stenosis  Admission Diagnoses:  Past Medical History  Diagnosis Date  . HSV (herpes simplex virus) anogenital infection     Hx of  . Sarcoma ([place])     Lower Extremitiy  . GERD (gastroesophageal reflux disease)   . PONV (postoperative nausea and vomiting)   . Headache     migraines  . Arthritis    Discharge Diagnoses:   Active Problems:   Spinal stenosis, lumbar region, with neurogenic claudication  Estimated body mass index is 23.62 kg/(m^2) as calculated from the following:   Height as of this encounter: '5\' 9"'  (1.753 m).   Weight as of this encounter: 72.576 kg (160 lb).  Procedure:  Procedure(s) (LRB): MICRODISCECTOMY LUMBAR LAMINECTOMY  L4-L5 ON LEFT, CENTRAL DECOMPRESSION L4-L5 FOR SPINAL STENOSIS, FORAMINOTOMY L4-L5 (Left)   Consults: None  HPI: The patient is a 55 year old female who presented with the chief complaint of buttocks pain. She originally had lateral hip pain as well. She saw some improvement in the lateral symptoms with greater trochanteric bursal cortisone injeciton, but continued to have buttocks and low back pain. She developed weakness in the left LE. MRI showed a disc herniation at L4-L5 on the left. 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   Laboratory Data: Hospital Outpatient Visit on 07/11/2015  Component Date Value Ref Range Status  . MRSA, PCR 07/11/2015 NEGATIVE  NEGATIVE Final  . Staphylococcus aureus 07/11/2015 NEGATIVE  NEGATIVE Final   Comment:        The Xpert SA Assay (FDA approved for NASAL specimens in patients over 40 years of age), is one component of a comprehensive surveillance program.  Test performance has been validated by [facility] for patients greater than or  equal to 3 year old. It is not intended to diagnose infection nor to guide or monitor treatment.   Marland Kitchen aPTT 07/11/2015 27  24 - 37 seconds Final  . Prothrombin Time 07/11/2015 14.1  11.6 - 15.2 seconds Final  . INR 07/11/2015 1.12  0.00 - 1.49 Final  . ABO/RH(D) 07/11/2015 O POS   Final  . Antibody Screen 07/11/2015 NEG   Final  . Sample Expiration 07/11/2015 07/20/2015   Final  . Extend sample reason 07/11/2015 NO TRANSFUSIONS OR PREGNANCY IN THE PAST 3 MONTHS   Final  . ABO/RH(D) 07/11/2015 O POS   Final  Admission on 06/25/2015, Discharged on 06/26/2015  Component Date Value Ref Range Status  . WBC 06/25/2015 7.4  4.0 - 10.5 K/uL Final  . RBC 06/25/2015 4.61  3.87 - 5.11 MIL/uL Final  . Hemoglobin 06/25/2015 13.9  12.0 - 15.0 g/dL Final  . HCT 06/25/2015 40.1  36.0 - 46.0 % Final  . MCV 06/25/2015 87.0  78.0 - 100.0 fL Final  . MCH 06/25/2015 30.2  26.0 - 34.0 pg Final  . MCHC 06/25/2015 34.7  30.0 - 36.0 g/dL Final  . RDW 06/25/2015 12.2  11.5 - 15.5 % Final  . Platelets 06/25/2015 199  150 - 400 K/uL Final  . Neutrophils Relative % 06/25/2015 70   Final  . Neutro Abs 06/25/2015 5.2  1.7 - 7.7 K/uL Final  . Lymphocytes Relative 06/25/2015 22   Final  . Lymphs Abs 06/25/2015 1.6  0.7 - 4.0 K/uL Final  .  Monocytes Relative 06/25/2015 8   Final  . Monocytes Absolute 06/25/2015 0.6  0.1 - 1.0 K/uL Final  . Eosinophils Relative 06/25/2015 0   Final  . Eosinophils Absolute 06/25/2015 0.0  0.0 - 0.7 K/uL Final  . Basophils Relative 06/25/2015 0   Final  . Basophils Absolute 06/25/2015 0.0  0.0 - 0.1 K/uL Final  . Sodium 06/25/2015 133* 135 - 145 mmol/L Final  . Potassium 06/25/2015 4.0  3.5 - 5.1 mmol/L Final  . Chloride 06/25/2015 98* 101 - 111 mmol/L Final  . CO2 06/25/2015 27  22 - 32 mmol/L Final  . Glucose, Bld 06/25/2015 110* 65 - 99 mg/dL Final  . BUN 06/25/2015 16  6 - 20 mg/dL Final  . Creatinine, Ser 06/25/2015 0.65  0.44 - 1.00 mg/dL Final  . Calcium 06/25/2015 8.8* 8.9  - 10.3 mg/dL Final  . Total Protein 06/25/2015 6.5  6.5 - 8.1 g/dL Final  . Albumin 06/25/2015 3.9  3.5 - 5.0 g/dL Final  . AST 06/25/2015 24  15 - 41 U/L Final  . ALT 06/25/2015 23  14 - 54 U/L Final  . Alkaline Phosphatase 06/25/2015 47  38 - 126 U/L Final  . Total Bilirubin 06/25/2015 0.8  0.3 - 1.2 mg/dL Final  . GFR calc non Af Amer 06/25/2015 >60  >60 mL/min Final  . GFR calc Af Amer 06/25/2015 >60  >60 mL/min Final   Comment: (NOTE) The eGFR has been calculated using the CKD EPI equation. This calculation has not been validated in all clinical situations. eGFR's persistently <60 mL/min signify possible Chronic Kidney Disease.   . Anion gap 06/25/2015 8  5 - 15 Final  . Lipase 06/25/2015 29  11 - 51 U/L Final  . Color, Urine 06/25/2015 YELLOW  YELLOW Final  . APPearance 06/25/2015 CLEAR  CLEAR Final  . Specific Gravity, Urine 06/25/2015 1.009  1.005 - 1.030 Final  . pH 06/25/2015 6.5  5.0 - 8.0 Final  . Glucose, UA 06/25/2015 NEGATIVE  NEGATIVE mg/dL Final  . Hgb urine dipstick 06/25/2015 NEGATIVE  NEGATIVE Final  . Bilirubin Urine 06/25/2015 NEGATIVE  NEGATIVE Final  . Ketones, ur 06/25/2015 40* NEGATIVE mg/dL Final  . Protein, ur 06/25/2015 NEGATIVE  NEGATIVE mg/dL Final  . Nitrite 06/25/2015 NEGATIVE  NEGATIVE Final  . Leukocytes, UA 06/25/2015 NEGATIVE  NEGATIVE Final   MICROSCOPIC NOT DONE ON URINES WITH NEGATIVE PROTEIN, BLOOD, LEUKOCYTES, NITRITE, OR GLUCOSE <1000 mg/dL.     X-Rays:Dg Lumbar Spine 2-3 Views  07/11/2015  CLINICAL DATA:  Preoperative examination EXAM: LUMBAR SPINE - 2-3 VIEW COMPARISON:  Coronal and sagittal reconstructed images through the lumbar spine from an abdominal and pelvic CT scan of September 06, 2011. Also supine abdominal x-ray of June 25, 2015. FINDINGS: The lumbar vertebral bodies are preserved in height. There is moderate levo curvature centered at L3. The pedicles and transverse processes are intact. There is mild disc space narrowing at L2-3 and  L3-4 and L4-5. There is no spondylolisthesis. There is facet joint hypertrophy at L5-S1. IMPRESSION: There is mild multilevel degenerative disc disease of the lumbar spine. There is no compression fracture. Electronically Signed   By: David  Martinique M.D.   On: 07/11/2015 15:28   Dg Spine Portable 1 View  07/17/2015  CLINICAL DATA:  Surgical level L4-5. EXAM: PORTABLE SPINE - 1 VIEW COMPARISON:  07/17/2015 FINDINGS: Posterior surgical instruments are in place with a localizing instrument overlying the L4-5 level. IMPRESSION: Intraoperative localization of L4-5. Electronically Signed  By: Rolm Baptise M.D.   On: 07/17/2015 11:59   Dg Spine Portable 1 View  07/17/2015  CLINICAL DATA:  Surgical level L4-5. EXAM: PORTABLE SPINE - 1 VIEW COMPARISON:  Earlier the same date and 07/11/2015. FINDINGS: Cross-table lateral view labeled 3. Skin spreaders are present posteriorly at L4 and L5. A surgical instrument overlies the L4 inferior articulating facets at the level of the L4-5 disc space. IMPRESSION: Intraoperative localization. Electronically Signed   By: Richardean Sale M.D.   On: 07/17/2015 11:29   Dg Spine Portable 1 View  07/17/2015  CLINICAL DATA:  Lumbar disc disease. EXAM: PORTABLE SPINE - 1 VIEW COMPARISON:  Radiographs dated 07/11/2015 FINDINGS: There are instruments on the spinous processes of L4 and L5. IMPRESSION: Instruments at L4-5. Electronically Signed   By: Lorriane Shire M.D.   On: 07/17/2015 11:08   Dg Spine Portable 1 View  07/17/2015  CLINICAL DATA:  55 year old female with a history of surgical localization EXAM: PORTABLE SPINE - 1 VIEW COMPARISON:  07/11/2015 FINDINGS: Single intraoperative cross-table lateral. Two surgical probes project over the posterior elements the lumbar spine, the superior at the spinous process of L4 and the inferior at the spinous process of L5. IMPRESSION: Intraoperative surgical localization of the L4 and L5 levels of lumbar spine, as above. Signed, Dulcy Fanny.  Earleen Newport, DO [facility] Electronically Signed   By: Corrie Mckusick D.O.   On: 07/17/2015 10:53   Dg Abd Acute W/chest  06/25/2015  CLINICAL DATA:  Nausea, vomiting, constipation. Upper abdominal pain for 3 days. Fatigue and dehydration. EXAM: DG ABDOMEN ACUTE W/ 1V CHEST COMPARISON:  CT 09/06/2011 FINDINGS: The cardiomediastinal contours are normal. Subsegmental atelectasis versus scarring left lung base. The lungs are otherwise clear. There is no free intra-abdominal air. No dilated bowel loops to suggest obstruction. Moderate volume of stool throughout the colon. No radiopaque calculi. Surgical clips in the right upper quadrant from cholecystectomy. Mild levo scoliotic curvature of the lumbar spine and associated degenerative change. No acute osseous abnormalities are seen. IMPRESSION: 1. Normal bowel gas pattern with moderate stool burden. 2. Clear lungs. Electronically Signed   By: Jeb Levering M.D.   On: 06/25/2015 23:00    Hospital Course: Michelle Contreras is a 55 y.o. who was admitted to [facility]. They were brought to the operating room on 07/17/2015 and underwent Procedure(s): MICRODISCECTOMY LUMBAR LAMINECTOMY  L4-L5 ON LEFT, CENTRAL DECOMPRESSION L4-L5 FOR SPINAL STENOSIS, FORAMINOTOMY L4-L5.  Patient tolerated the procedure well and was later transferred to the recovery room and then to the orthopaedic floor for postoperative care.  They were given PO and IV analgesics for pain control following their surgery.  They were given 24 hours of postoperative antibiotics of  Anti-infectives    Start     Dose/Rate Route Frequency Ordered Stop   07/17/15 1900  ceFAZolin (ANCEF) IVPB 1 g/50 mL premix  Status:  Discontinued     1 g 100 mL/hr over 30 Minutes Intravenous Every 8 hours 07/17/15 1339 07/18/15 1508   07/17/15 1043  polymyxin B 500,000 Units, bacitracin 50,000 Units in sodium chloride irrigation 0.9 % 500 mL irrigation   Status:  Discontinued       As needed 07/17/15 1059 07/17/15 1239   07/17/15 0758  ceFAZolin (ANCEF) IVPB 2g/100 mL premix     2 g 200 mL/hr over 30 Minutes Intravenous On call to O.R. 07/17/15 5732 07/17/15 1030     and started on  DVT prophylaxis in the form of Aspirin.   PT was ordered. Discharge planning consulted to help with postop disposition and equipment needs.  Patient had a good night on the evening of surgery.  They started to get up OOB with therapy on day one. Dressing was changed and the incision was clean and dry. Patient was seen in rounds and was ready to go home.   Diet: Cardiac diet Activity:WBAT Follow-up:in 2 weeks Disposition - Home Discharged Condition: stable   Discharge Instructions    Call MD / Call 911    Complete by:  As directed   If you experience chest pain or shortness of breath, CALL 911 and be transported to the hospital emergency room.  If you develope a fever above 101 F, pus (white drainage) or increased drainage or redness at the wound, or calf pain, call your surgeon's office.     Constipation Prevention    Complete by:  As directed   Drink plenty of fluids.  Prune juice may be helpful.  You may use a stool softener, such as Colace (over the counter) 100 mg twice a day.  Use MiraLax (over the counter) for constipation as needed.     Diet - low sodium heart healthy    Complete by:  As directed      Discharge instructions    Complete by:  As directed   For the first fhree days, remove your dressing, tape a piece of saran wrap over your incision. Take your shower, then remove the saran wrap and put a clean dressing on. No lotions or ointments over the incision.  After three days you can shower without the saran wrap.  No lifting or bending.  No driving when taking pain medications.  Call Dr. Gladstone Lighter if any wound complications or temperature of 101 degrees F or over.  Call the office for an appointment to see Dr. Gladstone Lighter in two weeks: [phone]  and ask for Dr. Charlestine Night nurse, Brunilda Payor.     Increase activity slowly as tolerated    Complete by:  As directed             Medication List    STOP taking these medications        metoCLOPramide 10 MG tablet  Commonly known as:  REGLAN     oxyCODONE-acetaminophen 10-325 MG tablet  Commonly known as:  PERCOCET      TAKE these medications        b complex vitamins capsule  Take 1 capsule by mouth daily.     cetirizine 10 MG tablet  Commonly known as:  ZYRTEC  Take 10 mg by mouth daily.     docusate sodium 100 MG capsule  Commonly known as:  COLACE  Take 1 capsule (100 mg total) by mouth every 12 (twelve) hours.     estradiol 1 MG tablet  Commonly known as:  ESTRACE  Take 0.5 mg by mouth daily.     ibuprofen 200 MG tablet  Commonly known as:  ADVIL,MOTRIN  Take 800 mg by mouth every 6 (six) hours as needed. For pain     LIDODERM 5 %  Generic drug:  lidocaine  Place 1 patch onto the skin daily as needed (For pain.).     methocarbamol 500 MG tablet  Commonly known as:  ROBAXIN  Take 1 tablet (500 mg total) by mouth every 6 (six) hours as needed for muscle spasms.     multivitamin with minerals Tabs tablet  Take  1 tablet by mouth daily.     oxyCODONE 5 MG immediate release tablet  Commonly known as:  Oxy IR/ROXICODONE  Take 1-2 tablets (5-10 mg total) by mouth every 4 (four) hours as needed for moderate pain.     polyethylene glycol powder powder  Commonly known as:  GLYCOLAX/MIRALAX  Take 17 g by mouth once. Dissolve one capful of powder into liquid and take once daily.     SUMAtriptan 100 MG tablet  Commonly known as:  IMITREX  Take 100 mg by mouth every 2 (two) hours as needed for migraine or headache.     tretinoin 0.05 % cream  Commonly known as:  RETIN-A  Apply 1 application topically at bedtime. Apply sparingly.     Vitamin D3 10000 units Tabs  Take 1,000 Units by mouth daily.           Follow-up Information    Follow up with  GIOFFRE,RONALD A, MD. Schedule an appointment as soon as possible for a visit in 2 weeks.   Specialty:  Orthopedic Surgery   Contact information:   [address] [place] [address] [phone]       Signed: Ardeen Jourdain, PA-C Orthopaedic Surgery 07/22/2015, 10:57 AM

## 2015-07-29 ENCOUNTER — Encounter: Payer: Self-pay | Admitting: Gastroenterology

## 2015-12-23 DIAGNOSIS — T8733 Neuroma of amputation stump, right lower extremity: Secondary | ICD-10-CM | POA: Insufficient documentation

## 2016-03-03 ENCOUNTER — Ambulatory Visit: Payer: BLUE CROSS/BLUE SHIELD | Attending: Family Medicine | Admitting: Physical Therapy

## 2016-03-03 ENCOUNTER — Encounter: Payer: Self-pay | Admitting: Physical Therapy

## 2016-03-03 DIAGNOSIS — R2681 Unsteadiness on feet: Secondary | ICD-10-CM | POA: Insufficient documentation

## 2016-03-03 DIAGNOSIS — M25661 Stiffness of right knee, not elsewhere classified: Secondary | ICD-10-CM | POA: Diagnosis present

## 2016-03-03 DIAGNOSIS — R29898 Other symptoms and signs involving the musculoskeletal system: Secondary | ICD-10-CM | POA: Diagnosis present

## 2016-03-03 DIAGNOSIS — R2689 Other abnormalities of gait and mobility: Secondary | ICD-10-CM | POA: Insufficient documentation

## 2016-03-03 DIAGNOSIS — M79661 Pain in right lower leg: Secondary | ICD-10-CM | POA: Diagnosis not present

## 2016-03-03 DIAGNOSIS — I82411 Acute embolism and thrombosis of right femoral vein: Secondary | ICD-10-CM | POA: Insufficient documentation

## 2016-03-03 NOTE — Therapy (Signed)
[facility] [address] [place], [place], [address] Phone: [phone]   Fax:  [phone]  Physical Therapy Evaluation  Patient Details  Name: Michelle Contreras MRN: [id number] Date of Birth: 30-May-1960 Referring Provider: Ian Malkin, FNP (Zhongyu Roberts Gaudy, MD)  Encounter Date: 03/03/2016      PT End of Session - 03/03/16 1638    Visit Number 1   Number of Visits 10   Date for PT Re-Evaluation 04/10/15   Authorization Type [employer]   PT Start Time 0845   PT Stop Time 0930   PT Time Calculation (min) 45 min   Activity Tolerance Patient limited by pain   Behavior During Therapy [facility] for tasks assessed/performed      Past Medical History:  Diagnosis Date  . Arthritis   . GERD (gastroesophageal reflux disease)   . Headache    migraines  . HSV (herpes simplex virus) anogenital infection    Hx of  . PONV (postoperative nausea and vomiting)   . Sarcoma ([place])    Lower Extremitiy    Past Surgical History:  Procedure Laterality Date  . ABDOMINAL HYSTERECTOMY    . amputation of lower limb     left leg, below knee  . CHOLECYSTECTOMY  09/07/2011   Procedure: LAPAROSCOPIC CHOLECYSTECTOMY WITH INTRAOPERATIVE CHOLANGIOGRAM;  Surgeon: Haywood Lasso, MD;  Location: [facility];  Service: General;  Laterality: N/A;  . LUMBAR LAMINECTOMY Left 07/17/2015   Procedure: MICRODISCECTOMY LUMBAR LAMINECTOMY  L4-L5 ON LEFT, CENTRAL DECOMPRESSION L4-L5 FOR SPINAL STENOSIS, FORAMINOTOMY L4-L5;  Surgeon: Latanya Maudlin, MD;  Location: [facility];  Service: Orthopedics;  Laterality: Left;  . NECK SURGERY    . removal of benign growth to r breast      There were no vitals filed for this visit.       Subjective Assessment - 03/03/16 0852    Subjective This 55yo female had a right Transtibial Amputation due to sarcoma 51yrs ago. She was able to function with prosthesis at full community level but had limb pain that progressively  limited her ability to wear & function with the prosthesis. She was found to have a neuroma. She had surgical excision with 2 neuromas found. One tibial nerve neuroma 1.5cm and one Peroneal nerve neuroma 2.4cm. She also had a mass (1.6 X 1.3 X 0.6cm) removed that pathology report say Fat necrosis with calcification & no malignancy. She has attempted to wear prosthesis since sutures were removed 2 weeks post-op but unable due to severe pain in residual limb. Patient also reports the prosthetist is attempting to use new socket with vacuum suspension. She is not able to tolerate wear of old prosthesis or new liner. Patient was referred to PT for evaluation.     Patient is accompained by: Family member   Limitations Standing;Walking;Lifting   Patient Stated Goals wants to get back in prosthesis to return to active lifestyle without pain   Currently in Pain? Yes   Pain Score 4   in last week, worst 8/10, best 0/10   Pain Location Leg   Pain Orientation Right   Pain Descriptors / Indicators Shooting;Stabbing;Throbbing   Pain Type Acute pain   Pain Onset 1 to 4 weeks ago   Pain Frequency Intermittent   Aggravating Factors  tried to force limb into prosthesis, sitting with limb dependent, wearing prosthesis liner   Pain Relieving Factors elevation, medication, not touching it   Effect of Pain on Daily Activities unable to wear prosthesis  Multiple Pain Sites No            [facility] PT Assessment - 03/03/16 0845      Assessment   Medical Diagnosis excision of neuroma   Referring Provider Ian Malkin, FNP  Zhongyu Roberts Gaudy, MD   Onset Date/Surgical Date 02/06/16     Balance Screen   Has the patient fallen in the past 6 months No   Has the patient had a decrease in activity level because of a fear of falling?  No   Is the patient reluctant to leave their home because of a fear of falling?  No     Home Environment   Living Environment Private residence   Living Arrangements  Spouse/significant other   Type of [place] to enter   Entrance Stairs-Number of Steps 2   Entrance Stairs-Rails None   Home Layout Two level;1/2 bath on main level   Alternate Level Stairs-Number of Steps 14   Alternate Level Stairs-Rails Right;Left;Can reach both     Prior Function   Level of Independence Independent;Independent with household mobility without device;Independent with community mobility without device     Observation/Other Assessments   Focus on Therapeutic Outcomes (FOTO)  40.38 Functional Status   Activities of Balance Confidence Scale (ABC Scale)  60.6%   Fear Avoidance Belief Questionnaire (FABQ)  47 (13)     ROM / Strength   AROM / PROM / Strength PROM;Strength     PROM   Overall PROM  Deficits   Overall PROM Comments Right Hip WFL   PROM Assessment Site Knee   Right/Left Knee Right   Right Knee Extension -14  with time able to increase to -12*   Right Knee Flexion 101     Strength   Overall Strength Deficits   Strength Assessment Site Knee   Right/Left Knee Right   Right Knee Flexion 4/5   Right Knee Extension 4/5     Flexibility   Soft Tissue Assessment /Muscle Length yes   Hamstrings -36* knee ext with hip flexed at 90*     Palpation   Palpation comment Right knee posterior lateral tenderness along Biceps Femoris tendon, scar from excision of neuroma adhered & only tolerates light pressure due to increased pain. Fibula is prominent and short length. Swelling in lateral compartment and distal limb. Tenderness along popliteal vein with palpable hardness proximally to mid-thigh level. Warmth & redness in popliteal area.          Prosthetics Assessment - 03/03/16 0845      Prosthetics   Prosthetic Care Dependent with Residual limb care   Current prosthetic wear tolerance (days/week)  reports trying liner 3-4 days/wk   Current prosthetic wear tolerance (#hours/day)  unable to wear prosthesis since neuroma excision  02/06/2016. Reports wearing liner up to 4 hrs until pain becomes unbearable.   Edema non-pitting edema in residual limb   Residual limb condition  multiple red spots consistent with allergic reaction. She reports prosthetist said to use lotion under new liner. Pt advised to stop for now while wearing only liner. See palpation assessment. 2 areas along scar ~1-23mm with suture or something protuding that would not release with tweezers.                           PT Education - 03/03/16 0845    Education provided Yes   Education Details wearing liner 2x/day up to tolerance  with removing once pain increases 2-3 notches on 0-10 scale. Pt to bring ace wraps to next session with plan to keep limb wrapped or liner 24 hrs/day. Signs of DVT & PE and concerns with popliteal pain, redness, swelling, recent surgery & immobilization with recommended doppler to rule out.    Person(s) Educated Patient;Spouse   Methods Explanation;Verbal cues   Comprehension Verbalized understanding             PT Long Term Goals - 03/03/16 1709      PT LONG TERM GOAL #1   Title Patient tolerates wear of prosthesis >12 hrs/day with limb tenderness </= 2/10 (Target Date: 04/09/2016)   Time 5   Period Weeks   Status New     PT LONG TERM GOAL #2   Title Patient demonstrates understanding of HEP / self-care including scar mobilization. (Target Date: 04/09/2016)   Time 5   Period Weeks   Status New     PT LONG TERM GOAL #3   Title Patient ambulates >300' with prosthesis independently. (Target Date: 04/09/2016)   Time 5   Period Weeks   Status New     PT LONG TERM GOAL #4   Title Patient performs standing activities with prosthesis with pain </= 2/10. (Target Date: 04/09/2016)   Time 5   Period Weeks   Status New               Plan - 03/03/16 1642    Clinical Impression Statement This 55yo female has been active with a Transtibial Amputation with a prosthesis for 16 yrs. She had surgery  for neuroma excision 02/06/2016 and has not been able to wear her prosthesis. She has pain in posterior limb along scar and popliteal area. She has decreased ROM of knee & hamstring. Her limb appears edematous. Patient appears would benefit from skilled PT.    Rehab Potential Good   PT Frequency 2x / week   PT Duration Other (comment)  5 weeks care over 6 week period.   PT Treatment/Interventions ADLs/Self Care Home Management;Electrical Stimulation;Moist Heat;Ultrasound;Fluidtherapy;DME Instruction;Gait training;Functional mobility training;Therapeutic activities;Therapeutic exercise;Neuromuscular re-education;Patient/family education;Manual techniques;Compression bandaging;Scar mobilization;Passive range of motion   PT Next Visit Plan Teach residual limb wrapping. Modalities for pain management. Scar mobilization.    Recommended Other Services Rule out DVT   Consulted and Agree with Plan of Care Patient;Family member/caregiver   Family Member Consulted husband      Patient will benefit from skilled therapeutic intervention in order to improve the following deficits and impairments:  Abnormal gait, Decreased activity tolerance, Decreased range of motion, Pain, Decreased scar mobility, Decreased mobility, Decreased skin integrity, Decreased strength, Prosthetic Dependency  Visit Diagnosis: Pain in right lower leg  Decreased range of motion of right knee  Other abnormalities of gait and mobility  Other symptoms and signs involving the musculoskeletal system  Unsteadiness on feet     Problem List Patient Active Problem List   Diagnosis Date Noted  . Spinal stenosis, lumbar region, with neurogenic claudication 07/17/2015  . Renal cyst 09/22/2011  . Acute cholecystitis 09/07/2011  . Family history of malignant neoplasm of gastrointestinal tract 08/14/2010    Jamey Reas PT, DPT 03/03/2016, 5:13 PM  [facility] [address] [place], [place], [address] Phone: [phone]   Fax:  [phone]  Name: JOSSILYN STUBBS MRN: [id number] Date of Birth: 1960-04-28

## 2016-03-05 ENCOUNTER — Ambulatory Visit: Payer: BLUE CROSS/BLUE SHIELD | Admitting: Physical Therapy

## 2016-03-05 DIAGNOSIS — M25661 Stiffness of right knee, not elsewhere classified: Secondary | ICD-10-CM

## 2016-03-05 DIAGNOSIS — R29898 Other symptoms and signs involving the musculoskeletal system: Secondary | ICD-10-CM

## 2016-03-05 DIAGNOSIS — M79661 Pain in right lower leg: Secondary | ICD-10-CM

## 2016-03-05 DIAGNOSIS — R2689 Other abnormalities of gait and mobility: Secondary | ICD-10-CM

## 2016-03-06 NOTE — Therapy (Signed)
[facility] [address] [place], [place], [address] Phone: [phone]   Fax:  [phone]  Physical Therapy Treatment  Patient Details  Name: Michelle Contreras MRN: [id number] Date of Birth: [birth year] Referring Provider: Ian Malkin, FNP (Zhongyu Roberts Gaudy, MD)  Encounter Date: 03/05/2016      PT End of Session - 03/05/16 1400    Visit Number 2   Number of Visits 10   Date for PT Re-Evaluation 04/10/15   Authorization Type BCBS   PT Start Time 1315   PT Stop Time 1400   PT Time Calculation (min) 45 min   Activity Tolerance Patient limited by pain;Patient tolerated treatment well   Behavior During Therapy [facility] for tasks assessed/performed      Past Medical History:  Diagnosis Date  . Arthritis   . GERD (gastroesophageal reflux disease)   . Headache    migraines  . HSV (herpes simplex virus) anogenital infection    Hx of  . PONV (postoperative nausea and vomiting)   . 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 ABDOMINAL HYSTERECTOMY    . amputation of lower limb     left leg, below knee  . CHOLECYSTECTOMY  09/07/2011   Procedure: LAPAROSCOPIC CHOLECYSTECTOMY WITH INTRAOPERATIVE CHOLANGIOGRAM;  Surgeon: Haywood Lasso, MD;  Location: [place];  Service: General;  Laterality: N/A;  . LUMBAR LAMINECTOMY Left 07/17/2015   Procedure: MICRODISCECTOMY LUMBAR LAMINECTOMY  L4-L5 ON LEFT, CENTRAL DECOMPRESSION L4-L5 FOR SPINAL STENOSIS, FORAMINOTOMY L4-L5;  Surgeon: Latanya Maudlin, MD;  Location: [facility];  Service: Orthopedics;  Laterality: Left;  . NECK SURGERY    . removal of benign growth to r breast      There were no vitals filed for this visit.      Subjective Assessment - 03/05/16 1315    Subjective She had [place] for DVT done at 3:00pm on day of PT eval at [facility]. She has full occulusion of Femoral Vien and partial occulsion of Tibial vien. She was  placed on blood thinner Eliquis and has taken 4 doses already. Her pain has already decreased some.      Patient is accompained by: Family member   Limitations Standing;Walking;Lifting   Patient Stated Goals wants to get back in prosthesis to return to active lifestyle without pain   Currently in Pain? Yes   Pain Score 4   increases to 7/10 with pressure   Pain Location Leg   Pain Orientation Right   Pain Descriptors / Indicators Shooting;Stabbing   Pain Type Acute pain   Pain Onset 1 to 4 weeks ago   Pain Frequency Intermittent   Aggravating Factors  pressure or touch    Pain Relieving Factors medication, relieving pressure      Self-care: See pt education.  Manual Technique: Soft tissue mobilizations to gastroc, posterior compartment. Gentle scar mobilizations lateral, proximal/distal and circular along length of Neuroma surgery scar.  Gentle passive range to increase knee extension.                             PT Education - 03/05/16 1315    Education provided Yes   Education Details traveling with DVT history & blood thinners, wrapping residual limb & shrinker use or wearing liner 2x/day to tolerance. PT recommended some form of compression 24hrs /day except bathing. Gentle scar mobilization    Person(s)  Educated Patient;Spouse   Methods Explanation;Demonstration;Verbal cues   Comprehension Verbalized understanding;Need further instruction             PT Long Term Goals - 03/05/16 1400      PT LONG TERM GOAL #1   Title Patient tolerates wear of prosthesis >12 hrs/day with limb tenderness </= 2/10 (Target Date: 04/09/2016)   Time 5   Period Weeks   Status On-going     PT LONG TERM GOAL #2   Title Patient demonstrates understanding of HEP / self-care including scar mobilization. (Target Date: 04/09/2016)   Time 5   Period Weeks   Status On-going     PT LONG TERM GOAL #3   Title Patient ambulates >300' with prosthesis independently. (Target  Date: 04/09/2016)   Time 5   Period Weeks   Status On-going     PT LONG TERM GOAL #4   Title Patient performs standing activities with prosthesis with pain </= 2/10. (Target Date: 04/09/2016)   Time 5   Period Weeks   Status On-going     PT LONG TERM GOAL #5   Status On-going               Plan - 03/05/16 1400    Clinical Impression Statement She was positive for a DVT as PT suspected at evaluation. Her edema has already decreased some with starting a blood thinner for treatment which has improved her limb tenderness a little. Pt & husband appear to understand scar mobilization & need to be gentle to minimize bruising with blood thinners.    Rehab Potential Good   PT Frequency 2x / week   PT Duration Other (comment)  5 weeks care over 6 week period.   PT Treatment/Interventions ADLs/Self Care Home Management;Electrical Stimulation;Moist Heat;Ultrasound;Fluidtherapy;DME Instruction;Gait training;Functional mobility training;Therapeutic activities;Therapeutic exercise;Neuromuscular re-education;Patient/family education;Manual techniques;Compression bandaging;Scar mobilization;Passive range of motion   PT Next Visit Plan Stretching HEP. Modalities for pain management. Scar mobilization.    Consulted and Agree with Plan of Care Patient;Family member/caregiver   Family Member Consulted husband      Patient will benefit from skilled therapeutic intervention in order to improve the following deficits and impairments:  Abnormal gait, Decreased activity tolerance, Decreased range of motion, Pain, Decreased scar mobility, Decreased mobility, Decreased skin integrity, Decreased strength, Prosthetic Dependency  Visit Diagnosis: Pain in right lower leg  Decreased range of motion of right knee  Other abnormalities of gait and mobility  Other symptoms and signs involving the musculoskeletal system     Problem List Patient Active Problem List   Diagnosis Date Noted  . Spinal  stenosis, lumbar region, with neurogenic claudication 07/17/2015  . Renal cyst 09/22/2011  . Acute cholecystitis 09/07/2011  . Family history of malignant neoplasm of gastrointestinal tract 08/14/2010    Jamey Reas PT, DPT 03/06/2016, 12:41 PM  [facility] [address] [place], [place], [address] Phone: [phone]   Fax:  [phone]  Name: Michelle Contreras MRN: [id number] Date of Birth: 1960/09/15

## 2016-03-10 ENCOUNTER — Ambulatory Visit: Payer: BLUE CROSS/BLUE SHIELD | Admitting: Physical Therapy

## 2016-03-10 ENCOUNTER — Encounter: Payer: Self-pay | Admitting: Physical Therapy

## 2016-03-10 DIAGNOSIS — M79661 Pain in right lower leg: Secondary | ICD-10-CM | POA: Diagnosis not present

## 2016-03-10 DIAGNOSIS — R29898 Other symptoms and signs involving the musculoskeletal system: Secondary | ICD-10-CM

## 2016-03-10 DIAGNOSIS — R2689 Other abnormalities of gait and mobility: Secondary | ICD-10-CM

## 2016-03-10 DIAGNOSIS — R2681 Unsteadiness on feet: Secondary | ICD-10-CM

## 2016-03-10 DIAGNOSIS — M25661 Stiffness of right knee, not elsewhere classified: Secondary | ICD-10-CM

## 2016-03-11 NOTE — Therapy (Signed)
[facility] [address] [place], [place], [address] Phone: [phone]   Fax:  [phone]  Physical Therapy Treatment  Patient Details  Name: BARBRA CASUCCI MRN: [id number] Date of Birth: 19-Feb-1961 Referring Provider: Ian Malkin, FNP (Zhongyu Roberts Gaudy, MD)  Encounter Date: 03/10/2016      PT End of Session - 03/10/16 1445    Visit Number 3   Number of Visits 10   Date for PT Re-Evaluation 04/10/15   Authorization Type BCBS   PT Start Time 1400   PT Stop Time [id number]   PT Time Calculation (min) 45 min   Activity Tolerance Patient limited by pain;Patient tolerated treatment well   Behavior During Therapy [facility] for tasks assessed/performed      Past Medical History:  Diagnosis Date  . Arthritis   . GERD (gastroesophageal reflux disease)   . Headache    migraines  . HSV (herpes simplex virus) anogenital infection    Hx of  . PONV (postoperative nausea and vomiting)   . 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 ABDOMINAL HYSTERECTOMY    . amputation of lower limb     left leg, below knee  . CHOLECYSTECTOMY  09/07/2011   Procedure: LAPAROSCOPIC CHOLECYSTECTOMY WITH INTRAOPERATIVE CHOLANGIOGRAM;  Surgeon: Haywood Lasso, MD;  Location: [facility];  Service: General;  Laterality: N/A;  . LUMBAR LAMINECTOMY Left 07/17/2015   Procedure: MICRODISCECTOMY LUMBAR LAMINECTOMY  L4-L5 ON LEFT, CENTRAL DECOMPRESSION L4-L5 FOR SPINAL STENOSIS, FORAMINOTOMY L4-L5;  Surgeon: Latanya Maudlin, MD;  Location[facility];  Service: Orthopedics;  Laterality: Left;  . NECK SURGERY    . removal of benign growth to r breast      There were no vitals filed for this visit.      Subjective Assessment - 03/10/16 1407    Subjective Her swelling continues to improve. Her limb is less sensitive. She has been able to get prosthesis on limb for last 2 days but unable to take weight  on prosthesis yet.    Limitations Standing;Walking;Lifting   Patient Stated Goals wants to get back in prosthesis to return to active lifestyle without pain   Currently in Pain? Yes   Pain Score 3   arrrived at 3/10 & left 5/10 with wt bearing activities   Pain Location Leg   Pain Orientation Right   Pain Descriptors / Indicators Stabbing;Shooting   Pain Type Acute pain   Pain Onset 1 to 4 weeks ago   Pain Frequency Intermittent   Aggravating Factors  weight bearing thru prosthesis   Pain Relieving Factors removing prosthesis   Effect of Pain on Daily Activities limits wear & standing with prosthesis      Therapeutic Activities: PT able to palpate posterior limb without pt verbally or non-verbally expressing excessive discomfort (reports mild).  Scar was mobile with only mild adherance noted along middle. Non-pitting mild edema in posterior compartment. PT advised to continue 24hr compression with shrinkers, wraps or liner with or without prosthesis.   PROM right knee extension -3* Standing near counter for support posteriorly & chair anteriorly: trunk flexion to upright position with cues on equal wt on LEs.  In parallel bars for UE support: prostretch for hamstrings 30 sec hold 2 reps. Yoga poses: Triangle with narrower stance than normal and modified downward dog using Yoga blocks. Sitting on 24" stool & 29" stool weight bearing thru LEs. See pt education.  Prosthetic training with old pin lock with gel liner system: Pt has rash on limb again with mulitple small ([id number]) red hair follicles. PT & pt problem-solved since she has skin allergies, it could be lotion, new gel make-up of vacuum seal or mixing/ chemical reaction to soap used to clean with lotion. PT to call prosthetist to ask options with lotions & new liners.  Pt ambulated with crutches with 3-pt pattern to limit wt thru prosthesis. PT instructed with demo in 4-pt pattern to facilitate increased wt on prosthesis but limited  as still has BUE support. Pt ambulated 35' X 3 with manual cues on timing right crutch & verbal cues on sequence. Distance limited to 63' with pain tolerance.                          PT Education - 03/10/16 1400    Education provided Yes   Education Details sitting on 24"-29" bar stool with prosthesis & LLE on floor to perform ADLs, computer ext.    Person(s) Educated Patient   Methods Explanation;Verbal cues   Comprehension Verbalized understanding;Verbal cues required             PT Long Term Goals - 03/05/16 1400      PT LONG TERM GOAL #1   Title Patient tolerates wear of prosthesis >12 hrs/day with limb tenderness </= 2/10 (Target Date: 04/09/2016)   Time 5   Period Weeks   Status On-going     PT LONG TERM GOAL #2   Title Patient demonstrates understanding of HEP / self-care including scar mobilization. (Target Date: 04/09/2016)   Time 5   Period Weeks   Status On-going     PT LONG TERM GOAL #3   Title Patient ambulates >300' with prosthesis independently. (Target Date: 04/09/2016)   Time 5   Period Weeks   Status On-going     PT LONG TERM GOAL #4   Title Patient performs standing activities with prosthesis with pain </= 2/10. (Target Date: 04/09/2016)   Time 5   Period Weeks   Status On-going     PT LONG TERM GOAL #5   Status On-going               Plan - 03/10/16 1445    Clinical Impression Statement Patient's limb is less sensitive with compliance with scar & soft tissue mobilization and gentle stretches along with edema management. She is limited in weight bearing tolerance with prosthesis but wearing prosthesis is improvement. She has rash on limb again so uncertain if allergic to lotion, vacuum seal liner or chemical reaction of cleaning soap & lotion.    Rehab Potential Good   PT Frequency 2x / week   PT Duration Other (comment)  5 weeks care over 6 week period.   PT Treatment/Interventions ADLs/Self Care Home  Management;Electrical Stimulation;Moist Heat;Ultrasound;Fluidtherapy;DME Instruction;Gait training;Functional mobility training;Therapeutic activities;Therapeutic exercise;Neuromuscular re-education;Patient/family education;Manual techniques;Compression bandaging;Scar mobilization;Passive range of motion   PT Next Visit Plan Stretching HEP. Modalities for pain management. Scar mobilization. Progress wt bearing with prosthesis.    Consulted and Agree with Plan of Care Patient      Patient will benefit from skilled therapeutic intervention in order to improve the following deficits and impairments:  Abnormal gait, Decreased activity tolerance, Decreased range of motion, Pain, Decreased scar mobility, Decreased mobility, Decreased skin integrity, Decreased strength, Prosthetic Dependency  Visit Diagnosis: Pain in right lower leg  Decreased range of motion of right knee  Other abnormalities of gait and mobility  Other symptoms and signs involving the musculoskeletal system  Unsteadiness on feet     Problem List Patient Active Problem List   Diagnosis Date Noted  . Spinal stenosis, lumbar region, with neurogenic claudication 07/17/2015  . Renal cyst 09/22/2011  . Acute cholecystitis 09/07/2011  . Family history of malignant neoplasm of gastrointestinal tract 08/14/2010    Jamey Reas PT, DPT 03/11/2016, 11:10 AM  [facility] [address] [place], [place], [address] Phone: [phone]   Fax:  [phone]  Name: MORAYO JUNIUS MRN: [id number] Date of Birth: [birth year]

## 2016-03-12 ENCOUNTER — Ambulatory Visit: Payer: BLUE CROSS/BLUE SHIELD | Admitting: Physical Therapy

## 2016-03-12 ENCOUNTER — Encounter: Payer: Self-pay | Admitting: Physical Therapy

## 2016-03-12 DIAGNOSIS — M25661 Stiffness of right knee, not elsewhere classified: Secondary | ICD-10-CM

## 2016-03-12 DIAGNOSIS — R2689 Other abnormalities of gait and mobility: Secondary | ICD-10-CM

## 2016-03-12 DIAGNOSIS — M79661 Pain in right lower leg: Secondary | ICD-10-CM | POA: Diagnosis not present

## 2016-03-12 DIAGNOSIS — R2681 Unsteadiness on feet: Secondary | ICD-10-CM

## 2016-03-12 DIAGNOSIS — R29898 Other symptoms and signs involving the musculoskeletal system: Secondary | ICD-10-CM

## 2016-03-13 NOTE — Therapy (Signed)
[facility] [address] [place], [place], [address] Phone: [phone]   Fax:  [phone]  Physical Therapy Treatment  Patient Details  Name: Michelle Contreras MRN: [id number] Date of Birth: 30-Mar-1960 Referring Provider: Ian Malkin, FNP (Zhongyu Roberts Gaudy, MD)  Encounter Date: 03/12/2016      PT End of Session - 03/12/16 1530    Visit Number 4   Number of Visits 10   Date for PT Re-Evaluation 04/10/15   Authorization Type BCBS   PT Start Time [id number]   PT Stop Time [id number]   PT Time Calculation (min) 40 min   Activity Tolerance Patient limited by pain;Patient tolerated treatment well   Behavior During Therapy [facility] for tasks assessed/performed      Past Medical History:  Diagnosis Date  . Arthritis   . GERD (gastroesophageal reflux disease)   . Headache    migraines  . HSV (herpes simplex virus) anogenital infection    Hx of  . PONV (postoperative nausea and vomiting)   . 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 ABDOMINAL HYSTERECTOMY    . amputation of lower limb     left leg, below knee  . CHOLECYSTECTOMY  09/07/2011   Procedure: LAPAROSCOPIC CHOLECYSTECTOMY WITH INTRAOPERATIVE CHOLANGIOGRAM;  Surgeon: Haywood Lasso, MD;  Location: [facility];  Service: General;  Laterality: N/A;  . LUMBAR LAMINECTOMY Left 07/17/2015   Procedure: MICRODISCECTOMY LUMBAR LAMINECTOMY  L4-L5 ON LEFT, CENTRAL DECOMPRESSION L4-L5 FOR SPINAL STENOSIS, FORAMINOTOMY L4-L5;  Surgeon: Latanya Maudlin, MD;  [facility];  Service: Orthopedics;  Laterality: Left;  . NECK SURGERY    . removal of benign growth to r breast      There were no vitals filed for this visit.      Subjective Assessment - 03/12/16 1800    Subjective She saw her surgeon for follow-up visit and reported first time he has seen clot with this type of surgery but feels progress with PT is going well.     Limitations Standing;Walking;Lifting   Patient Stated Goals wants to get back in prosthesis to return to active lifestyle without pain   Currently in Pain? Yes   Pain Score 3    Pain Location Leg   Pain Orientation Right   Pain Descriptors / Indicators Stabbing;Shooting   Pain Type Acute pain   Pain Onset 1 to 4 weeks ago   Pain Frequency Intermittent   Aggravating Factors  pressure on posterior residual limb including prosthesis weight bearing   Pain Relieving Factors removing prosthesis     Prosthetic Training with Vacuum Seal TTA prosthesis Pt reports prosthetist trimmed posterior wall which helped as well as less limb tenderness. She is not using lotion under liner due to rash. PT instructed to monitor for friction which is purpose of lotion with this system. Pt verbalized understanding. New prosthesis has non-adjustable ankle which old prosthesis did have. PT used internet to show Adjust-a-lift that could be used to accommodate shoe height changes if set for 1/4" heel shoes.  Patient ambulated 200' X 2 with axillary crutches with partial weight on prosthesis with step thru pattern with verbal cues.  Patient ambulated 120' with single point cane with min guard to supervision. Distance limited by onset of pain. PT instructed to use limp / decreased stance duration to determine when to stop or increase UE support as guide.  Stairs: 2 rails reciprocal with cues  on weight transfer over prosthesis. She tolerated 4 steps 3 reps before decreased prosthetic stance duration. Ramps with axillary crutches with demo & verbal cues on weight transfer & mid-stance on inclines. Pt negotiated 4 reps with verbal cues.   Self-care: PT performed & instructed pt in ice massage. PT recommended to tolerance which initially is 2-3 min building to 8 min. PT recommended 2-3 times per day. Pt verbalized understanding. Pt verbalized proper use of tennis ball for massage both closed & open chain.  Therapeutic  Exercise: Hamstring / adductor stretch long sitting with LEs abducted with trunk flexion mid-line & over each LE. 20 sec hold 3 reps. Quadriceps stretch standing use towel to reach ankle with hip extension: 20 sec hold 3 reps.                             PT Education - 03/12/16 1415    Education provided Yes   Education Details ice massage, stretches for hamstrings & quadriceps; continue compression, edema control 24 hrs/day with prosthesis, liner, shrinkers & wraps.  With traveling, move/ walk every 2 hrs and use airline assistance to limit distances and take both old & new prostheses on trip along with crutches.    Person(s) Educated Patient   Methods Explanation;Demonstration;Verbal cues   Comprehension Verbalized understanding;Returned demonstration             PT Long Term Goals - 03/05/16 1400      PT LONG TERM GOAL #1   Title Patient tolerates wear of prosthesis >12 hrs/day with limb tenderness </= 2/10 (Target Date: 04/09/2016)   Time 5   Period Weeks   Status On-going     PT LONG TERM GOAL #2   Title Patient demonstrates understanding of HEP / self-care including scar mobilization. (Target Date: 04/09/2016)   Time 5   Period Weeks   Status On-going     PT LONG TERM GOAL #3   Title Patient ambulates >300' with prosthesis independently. (Target Date: 04/09/2016)   Time 5   Period Weeks   Status On-going     PT LONG TERM GOAL #4   Title Patient performs standing activities with prosthesis with pain </= 2/10. (Target Date: 04/09/2016)   Time 5   Period Weeks   Status On-going     PT LONG TERM GOAL #5   Status On-going               Plan - 03/12/16 1530    Clinical Impression Statement Patient's limb continues to be less sensitive enabling increased weight bearing & prosthesis wear /use. She was wearing new vacuum seal prosthesis which has not been able to tolerate wear since surgery.    Rehab Potential Good   PT Frequency 2x / week    PT Duration Other (comment)  5 weeks care over 6 week period.   PT Treatment/Interventions ADLs/Self Care Home Management;Electrical Stimulation;Moist Heat;Ultrasound;Fluidtherapy;DME Instruction;Gait training;Functional mobility training;Therapeutic activities;Therapeutic exercise;Neuromuscular re-education;Patient/family education;Manual techniques;Compression bandaging;Scar mobilization;Passive range of motion   PT Next Visit Plan Hold 2 weeks as patient on vacation. When returns assess gait & balance, limb tenderness & progress as indicated.    Consulted and Agree with Plan of Care Patient      Patient will benefit from skilled therapeutic intervention in order to improve the following deficits and impairments:  Abnormal gait, Decreased activity tolerance, Decreased range of motion, Pain, Decreased scar mobility, Decreased mobility, Decreased skin integrity, Decreased strength, Prosthetic  Dependency  Visit Diagnosis: Pain in right lower leg  Decreased range of motion of right knee  Other abnormalities of gait and mobility  Other symptoms and signs involving the musculoskeletal system  Unsteadiness on feet     Problem List Patient Active Problem List   Diagnosis Date Noted  . Spinal stenosis, lumbar region, with neurogenic claudication 07/17/2015  . Renal cyst 09/22/2011  . Acute cholecystitis 09/07/2011  . Family history of malignant neoplasm of gastrointestinal tract 08/14/2010    Jamey Reas PT, DPT 03/13/2016, 11:38 AM  [facility] [address] [place], [place], [address] Phone: [phone]   Fax:  [phone]  Name: Michelle Contreras MRN: [id number] Date of Birth: 21-Oct-1960

## 2016-03-25 ENCOUNTER — Encounter: Payer: BLUE CROSS/BLUE SHIELD | Admitting: Physical Therapy

## 2016-03-27 ENCOUNTER — Encounter: Payer: BLUE CROSS/BLUE SHIELD | Admitting: Physical Therapy

## 2016-03-31 ENCOUNTER — Ambulatory Visit: Payer: BLUE CROSS/BLUE SHIELD | Attending: Family Medicine | Admitting: Physical Therapy

## 2016-03-31 ENCOUNTER — Encounter: Payer: Self-pay | Admitting: Physical Therapy

## 2016-03-31 DIAGNOSIS — M79661 Pain in right lower leg: Secondary | ICD-10-CM | POA: Insufficient documentation

## 2016-03-31 NOTE — Patient Instructions (Addendum)
Home Modalities: Contrast Bath    -Prepare two containers large enough for right foot. Fill one with warm water to tolerance. Fill other with cold water at coldest temp possible.  -Soak in warm water for _3-5__ minutes. -Then soak in cold water for _3-5__ minutes. Repeat for _20-30__ minutes, ending in warm water.  Do _1-2__ times per day.    Copyright  VHI. All rights reserved.

## 2016-03-31 NOTE — Therapy (Signed)
[facility] [address] [place], [place], [address] Phone: [phone]   Fax:  [phone]  Physical Therapy Treatment  Patient Details  Name: Michelle Contreras MRN: [id number] Date of Birth: 1961/03/08 Referring Provider: Ian Malkin, FNP (Zhongyu Roberts Gaudy, MD)  Encounter Date: 03/31/2016      PT End of Session - 03/31/16 1412    Visit Number 5   Number of Visits 10   Date for PT Re-Evaluation 04/10/15   Authorization Type BCBS   PT Start Time 1405   PT Stop Time 1433  discharge, not all time was needed   PT Time Calculation (min) 28 min   Activity Tolerance Patient tolerated treatment well   Behavior During Therapy [facility] for tasks assessed/performed      Past Medical History:  Diagnosis Date  . Arthritis   . GERD (gastroesophageal reflux disease)   . Headache    migraines  . HSV (herpes simplex virus) anogenital infection    Hx of  . PONV (postoperative nausea and vomiting)   . Sarcoma ([place])    Lower Extremitiy    Past Surgical History:  Procedure Laterality Date  . ABDOMINAL HYSTERECTOMY    . amputation of lower limb     left leg, below knee  . CHOLECYSTECTOMY  09/07/2011   Procedure: LAPAROSCOPIC CHOLECYSTECTOMY WITH INTRAOPERATIVE CHOLANGIOGRAM;  Surgeon: Haywood Lasso, MD;  Location: [facility];  Service: General;  Laterality: N/A;  . LUMBAR LAMINECTOMY Left 07/17/2015   Procedure: MICRODISCECTOMY LUMBAR LAMINECTOMY  L4-L5 ON LEFT, CENTRAL DECOMPRESSION L4-L5 FOR SPINAL STENOSIS, FORAMINOTOMY L4-L5;  Surgeon: Latanya Maudlin, MD;  Location: [facility];  Service: Orthopedics;  Laterality: Left;  . NECK SURGERY    . removal of benign growth to r breast      There were no vitals filed for this visit.      Subjective Assessment - 03/31/16 1408    Subjective Pt arrives today requesting today be her last visit after getting a few questions answered regarding prosthesis/swelling in limb. Reports  no falls. No longer using AD with gait: reports the first few steps are painful, then pain resides each time she gets up.    Limitations Standing;Walking;Lifting   Patient Stated Goals wants to get back in prosthesis to return to active lifestyle without pain   Currently in Pain? Yes   Pain Score 3   10/10= worst in 24 hours   Pain Location Leg   Pain Orientation Right   Pain Descriptors / Indicators Stabbing;Shooting;Sore   Pain Type Acute pain   Pain Onset 1 to 4 weeks ago   Pain Frequency Intermittent   Aggravating Factors  first few steps when getting up,    Pain Relieving Factors removing prosthesis           [facility] Adult PT Treatment/Exercise - 03/31/16 1414      Ambulation/Gait   Ambulation/Gait Yes   Ambulation/Gait Assistance 6: Modified independent (Device/Increase time)   Ambulation Distance (Feet) 350 Feet   Assistive device Prosthesis;None   Gait Pattern Within Functional Limits  mildly antalgic at times   Ambulation Surface Level;Indoor     Prosthetics   Current prosthetic wear tolerance (days/week)  daily   Current prosthetic wear tolerance (#hours/day)  all awake hours with limited walking   Edema non-pitting edema in residual limb   Residual limb condition  skin is intact, no red areas, no rash present.  PT Long Term Goals - 03/31/16 1413      PT LONG TERM GOAL #1   Title Patient tolerates wear of prosthesis >12 hrs/day with limb tenderness </= 2/10 (Target Date: 04/09/2016)   Baseline 03/31/16: wearing >/= 12 hours a day with reported pain levels of 8-9/10 with first few steps, then pain resolves to </=3/10.   Time --   Period --   Status Partially Met     PT LONG TERM GOAL #2   Title Patient demonstrates understanding of HEP / self-care including scar mobilization. (Target Date: 04/09/2016)   Baseline 03/31/16: met. Pt also educated on contrast bath today to assisit with continued pain/swelling in limb.   Time --   Period --   Status  Achieved     PT LONG TERM GOAL #3   Title Patient ambulates >300' with prosthesis independently. (Target Date: 04/09/2016)   Baseline 03/31/16: met today   Time --   Period --   Status Achieved     PT LONG TERM GOAL #4   Title Patient performs standing activities with prosthesis with pain </= 2/10. (Target Date: 04/09/2016)   Baseline 03/31/16: continues to have pain with initial standing, then rates it </= 3/10.   Time --   Period --   Status Partially Met            Plan - 03/31/16 1412    Clinical Impression Statement Pt has met 2/4 LTGs, with other goals partially met. Pt reports she does not like the suction suspension and has been wearing her old one with pin lock liner. Reports she still has pain with this socket and plans to see Nicole Kindred, her prosthetist, to have this addressed. Discussed etiology of swelling after surgery/injuries and that it can be present for up to 1 year afterwards. Discussed pt continuing to use shrinker at night, ice massage for edema/pain management. Also educated pt in use of contrast bath at home as well. Primary PT notfied of pt request for discharge today and is in agreement. Discharge today.                          Rehab Potential Good   PT Frequency 2x / week   PT Duration Other (comment)  5 weeks care over 6 week period.   PT Treatment/Interventions ADLs/Self Care Home Management;Electrical Stimulation;Moist Heat;Ultrasound;Fluidtherapy;DME Instruction;Gait training;Functional mobility training;Therapeutic activities;Therapeutic exercise;Neuromuscular re-education;Patient/family education;Manual techniques;Compression bandaging;Scar mobilization;Passive range of motion   PT Next Visit Plan discharge today.   Consulted and Agree with Plan of Care Patient      Patient will benefit from skilled therapeutic intervention in order to improve the following deficits and impairments:  Abnormal gait, Decreased activity tolerance, Decreased range of motion, Pain,  Decreased scar mobility, Decreased mobility, Decreased skin integrity, Decreased strength, Prosthetic Dependency  Visit Diagnosis: Pain in right lower leg     Problem List Patient Active Problem List   Diagnosis Date Noted  . Spinal stenosis, lumbar region, with neurogenic claudication 07/17/2015  . Renal cyst 09/22/2011  . Acute cholecystitis 09/07/2011  . Family history of malignant neoplasm of gastrointestinal tract 08/14/2010    Willow Ora, PTA, [facility] [address], [place], [place] [address] [phone] 03/31/16, 2:46 PM   Name: Michelle Contreras MRN: [id number] Date of Birth: 1961-02-24

## 2016-04-02 ENCOUNTER — Ambulatory Visit: Payer: BLUE CROSS/BLUE SHIELD | Admitting: Physical Therapy

## 2016-04-07 ENCOUNTER — Encounter: Payer: BLUE CROSS/BLUE SHIELD | Admitting: Physical Therapy

## 2016-04-09 ENCOUNTER — Encounter: Payer: BLUE CROSS/BLUE SHIELD | Admitting: Physical Therapy

## 2016-06-03 ENCOUNTER — Other Ambulatory Visit: Payer: Self-pay | Admitting: Family Medicine

## 2016-06-03 DIAGNOSIS — Z1231 Encounter for screening mammogram for malignant neoplasm of breast: Secondary | ICD-10-CM

## 2016-06-04 ENCOUNTER — Ambulatory Visit
Admission: RE | Admit: 2016-06-04 | Discharge: 2016-06-04 | Disposition: A | Discharge status: Home / Self Care | Payer: BLUE CROSS/BLUE SHIELD | Source: Ambulatory Visit | Attending: Family Medicine | Admitting: Family Medicine

## 2016-06-04 DIAGNOSIS — Z1231 Encounter for screening mammogram for malignant neoplasm of breast: Secondary | ICD-10-CM

## 2016-07-09 DIAGNOSIS — I82511 Chronic embolism and thrombosis of right femoral vein: Secondary | ICD-10-CM | POA: Diagnosis not present

## 2016-07-09 DIAGNOSIS — I82592 Chronic embolism and thrombosis of other specified deep vein of left lower extremity: Secondary | ICD-10-CM | POA: Diagnosis not present

## 2016-07-09 DIAGNOSIS — I82591 Chronic embolism and thrombosis of other specified deep vein of right lower extremity: Secondary | ICD-10-CM | POA: Diagnosis not present

## 2016-07-09 DIAGNOSIS — I82512 Chronic embolism and thrombosis of left femoral vein: Secondary | ICD-10-CM | POA: Diagnosis not present

## 2016-12-31 ENCOUNTER — Encounter: Payer: Self-pay | Admitting: Physical Therapy

## 2016-12-31 NOTE — Therapy (Signed)
[facility] [address] [place], [place], [address] Phone: [phone]   Fax:  [phone]  Patient Details  Name: Michelle Contreras MRN: [id number] Date of Birth: March 15, 1961 Referring Provider:  Ian Malkin, FNP  Encounter Date: 12/31/2016  PHYSICAL THERAPY DISCHARGE SUMMARY  Visits from Start of Care: 5  Current functional level related to goals / functional outcomes: Patient was progressing towards LTGs but did not return for last few PT visits so unknown level at discharge.    Remaining deficits: Unknown as did not return   Education / Equipment: HEP & DVT education  Plan: Patient agrees to discharge.  Patient goals were not met. Patient is being discharged due to not returning since the last visit.  ?????         Jamey Reas, PT, DPT 12/31/2016, 10:29 AM  [facility] [address] [place], [place], [address] Phone: [phone]   Fax:  [phone]

## 2017-02-08 DIAGNOSIS — D485 Neoplasm of uncertain behavior of skin: Secondary | ICD-10-CM | POA: Diagnosis not present

## 2017-02-10 DIAGNOSIS — Z0000 Encounter for general adult medical examination without abnormal findings: Secondary | ICD-10-CM | POA: Diagnosis not present

## 2017-02-15 DIAGNOSIS — Z1211 Encounter for screening for malignant neoplasm of colon: Secondary | ICD-10-CM | POA: Diagnosis not present

## 2017-02-15 DIAGNOSIS — Z23 Encounter for immunization: Secondary | ICD-10-CM | POA: Diagnosis not present

## 2017-02-15 DIAGNOSIS — R062 Wheezing: Secondary | ICD-10-CM | POA: Diagnosis not present

## 2017-02-15 DIAGNOSIS — R002 Palpitations: Secondary | ICD-10-CM | POA: Diagnosis not present

## 2017-02-15 DIAGNOSIS — Z0000 Encounter for general adult medical examination without abnormal findings: Secondary | ICD-10-CM | POA: Diagnosis not present

## 2017-02-15 DIAGNOSIS — J0140 Acute pansinusitis, unspecified: Secondary | ICD-10-CM | POA: Diagnosis not present

## 2017-03-18 ENCOUNTER — Encounter: Payer: Self-pay | Admitting: Family Medicine

## 2017-05-12 DIAGNOSIS — Z89512 Acquired absence of left leg below knee: Secondary | ICD-10-CM | POA: Diagnosis not present

## 2017-05-24 DIAGNOSIS — J0141 Acute recurrent pansinusitis: Secondary | ICD-10-CM | POA: Diagnosis not present

## 2017-05-24 DIAGNOSIS — D229 Melanocytic nevi, unspecified: Secondary | ICD-10-CM | POA: Diagnosis not present

## 2017-05-27 DIAGNOSIS — J029 Acute pharyngitis, unspecified: Secondary | ICD-10-CM | POA: Diagnosis not present

## 2017-06-04 DIAGNOSIS — L820 Inflamed seborrheic keratosis: Secondary | ICD-10-CM | POA: Diagnosis not present

## 2017-06-04 DIAGNOSIS — L821 Other seborrheic keratosis: Secondary | ICD-10-CM | POA: Diagnosis not present

## 2017-06-04 DIAGNOSIS — D1801 Hemangioma of skin and subcutaneous tissue: Secondary | ICD-10-CM | POA: Diagnosis not present

## 2017-09-15 ENCOUNTER — Other Ambulatory Visit: Payer: Self-pay | Admitting: Allergy

## 2017-09-15 ENCOUNTER — Ambulatory Visit
Admission: RE | Admit: 2017-09-15 | Discharge: 2017-09-15 | Disposition: A | Discharge status: Home / Self Care | Payer: BLUE CROSS/BLUE SHIELD | Source: Ambulatory Visit | Attending: Allergy | Admitting: Allergy

## 2017-09-15 DIAGNOSIS — J301 Allergic rhinitis due to pollen: Secondary | ICD-10-CM | POA: Diagnosis not present

## 2017-09-15 DIAGNOSIS — R05 Cough: Secondary | ICD-10-CM

## 2017-09-15 DIAGNOSIS — J3089 Other allergic rhinitis: Secondary | ICD-10-CM | POA: Diagnosis not present

## 2017-09-15 DIAGNOSIS — J309 Allergic rhinitis, unspecified: Secondary | ICD-10-CM | POA: Diagnosis not present

## 2017-09-15 DIAGNOSIS — R059 Cough, unspecified: Secondary | ICD-10-CM

## 2017-09-21 DIAGNOSIS — L820 Inflamed seborrheic keratosis: Secondary | ICD-10-CM | POA: Diagnosis not present

## 2017-10-12 ENCOUNTER — Other Ambulatory Visit: Payer: Self-pay | Admitting: Family Medicine

## 2017-10-12 DIAGNOSIS — R9389 Abnormal findings on diagnostic imaging of other specified body structures: Secondary | ICD-10-CM

## 2017-10-13 ENCOUNTER — Ambulatory Visit: Payer: BLUE CROSS/BLUE SHIELD | Admitting: Pulmonary Disease

## 2017-10-13 ENCOUNTER — Encounter: Payer: Self-pay | Admitting: Pulmonary Disease

## 2017-10-13 VITALS — BP 116/76 | HR 74 | Ht 70.0 in | Wt 185.0 lb

## 2017-10-13 DIAGNOSIS — R062 Wheezing: Secondary | ICD-10-CM

## 2017-10-13 DIAGNOSIS — R9389 Abnormal findings on diagnostic imaging of other specified body structures: Secondary | ICD-10-CM

## 2017-10-13 LAB — NITRIC OXIDE: NITRIC OXIDE: 24

## 2017-10-13 NOTE — Patient Instructions (Signed)
57 year old female comes complaining of 2 problems: Wheezing as well as an abnormal chest x-ray.  In regards to the wheezing she has associated allergic rhinitis symptoms and a sensation that the wheezing is coming from her throat so I wonder if this is just laryngeal irritation from allergic rhinitis.  She is had an extensive work-up by the allergist already so I am going to request records from them.  Today her exhaled nitric oxide test was only modestly elevated so it is not clear to me that she has allergic asthma yet.  She may have exercise-induced bronchospasm and the first-line treatment for that is just using albuterol which I have encouraged her to do.  Given her history of sarcoma any abnormality seen on the chest x-ray should be followed up so I agree with plans for a CT scan of her chest.  We will see her back after that.  Plan: Abnormal chest x-ray, question of soft tissue abnormality versus lung infiltrate: I agree with plans for a CT scan, if there are no arrangements for this please let us know and we will order the test ourselves  Wheezing: We will request records from your evaluation at the [facility] Please use albuterol 2 puffs 10 minutes prior to exercise to see if this helps with the symptoms  Follow-up with [place] in about 2 weeks or sooner if needed

## 2017-10-13 NOTE — Progress Notes (Signed)
Synopsis: Referred in July 2019 for an abnormal chest X-ray and wheezing  Subjective:   PATIENT ID: Michelle Contreras GENDER: female DOB: 1960/03/30, MRN: [id number]   HPI  Chief Complaint  Patient presents with  . Consult    Self-referral for Wheezing.     This is a pleasant 57 year old female who is a lifelong non-smoker who comes to my clinic today for evaluation of an abnormal chest x-ray.  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  She has been followed by [facility] off and on since then.  She says that she had serial chest imaging and a chest x-ray to look for evidence of metastatic disease but fortunately this was never seen.  She has not had 1 of those tests in quite some time.  However, after December 2019 she developed some wheezing on exertion and when she went to the doctor to have this evaluated a chest x-ray was ordered.  This showed a questionable abnormality in the soft tissue overlying her right lung versus perhaps possible pneumonia.  She has been referred to me for evaluation of the same.  In regards to the wheezing she says that it occurs predominantly when she exerts herself.  She says it is an audible wheeze and she has thought that it is probably coming from her throat.  However, she has had some sensation of shortness of breath when this happens.  It usually with exercise or if she is playing with her grandchildren.  She does have significant sinus congestion and postnasal drip and she actually went to an allergist to have this evaluated.  She says that she was told she was allergic to cockroach and dust.  She had a breathing test which was performed there that day and she was given a prescription for Symbicort but she is not sure if she has asthma or not based on that evaluation.  She says that she is never had asthma as a kid.  Past Medical History:  Diagnosis Date  . Arthritis   .  GERD (gastroesophageal reflux disease)   . Headache    migraines  . HSV (herpes simplex virus) anogenital infection    Hx of  . PONV (postoperative nausea and vomiting)   . Sarcoma ([place])    Lower Extremitiy     Family History  Problem Relation Age of Onset  . Breast cancer Father   . Colon cancer Father   . Colon polyps Father   . Heart disease Father        ?  Marland Kitchen Cancer Father        breast and colon and kidney  . Diabetes Mother   . Kidney disease Mother        currently on dialysis  . Lung cancer Mother        lung  . Kidney cancer Paternal Aunt   . Diabetes Maternal Grandfather   . Colon polyps Brother      Social History   Socioeconomic History  . Marital status: Married    Spouse name: Not on file  . Number of children: Not on file  . Years of education: Not on file  . Highest education level: Not on file  Occupational History    Employer: [employer]  . Financial resource strain: Not on file  . Food insecurity:    Worry: Not on file  Inability: Not on file  . Transportation needs:    Medical: Not on file    Non-medical: Not on file  Tobacco Use  . Smoking status: Passive Smoke Exposure - Never Smoker  . Smokeless tobacco: Never Used  Substance and Sexual Activity  . Alcohol use: Yes    Alcohol/week: 0.6 oz    Types: 1 Standard drinks or equivalent per week    Comment: occasional-weekends  . Drug use: No  . Sexual activity: Not on file  Lifestyle  . Physical activity:    Days per week: Not on file    Minutes per session: Not on file  . Stress: Not on file  Relationships  . Social connections:    Talks on phone: Not on file    Gets together: Not on file    Attends religious service: Not on file    Active member of club or organization: Not on file    Attends meetings of clubs or organizations: Not on file    Relationship status: Not on file  . Intimate partner violence:    Fear of current or ex partner: Not on file     Emotionally abused: Not on file    Physically abused: Not on file    Forced sexual activity: Not on file  Other Topics Concern  . Not on file  Social History Narrative  . Not on file     Allergies  Allergen Reactions  . Adhesive [Tape] Other (See Comments)    Patient reports full thickness blister to adhesive - has to use paper tape  . Tramadol Nausea And Vomiting  . Other Rash and Other (See Comments)    Topical Agents like lotions - blisters     Outpatient Medications Prior to Visit  Medication Sig Dispense Refill  . b complex vitamins capsule Take 1 capsule by mouth daily.      . Cholecalciferol (VITAMIN D3) 10000 units TABS Take 1,000 Units by mouth daily.    Marland Kitchen estradiol (ESTRACE) 1 MG tablet Take 0.5 mg by mouth daily.    Marland Kitchen ibuprofen (ADVIL,MOTRIN) 200 MG tablet Take 800 mg by mouth every 6 (six) hours as needed. For pain    . levocetirizine (XYZAL) 5 MG tablet Take 2.5 mg by mouth every evening.    . lidocaine (LIDODERM) 5 % Place 1 patch onto the skin daily as needed (For pain.).     Marland Kitchen Multiple Vitamin (MULTIVITAMIN WITH MINERALS) TABS tablet Take 1 tablet by mouth daily.    . SUMAtriptan (IMITREX) 100 MG tablet Take 100 mg by mouth every 2 (two) hours as needed for migraine or headache.     . tretinoin (RETIN-A) 0.05 % cream Apply 1 application topically at bedtime. Apply sparingly.    . cetirizine (ZYRTEC) 10 MG tablet Take 10 mg by mouth daily.    Marland Kitchen docusate sodium (COLACE) 100 MG capsule Take 1 capsule (100 mg total) by mouth every 12 (twelve) hours. (Patient not taking: Reported on 10/13/2017) 60 capsule 0  . methocarbamol (ROBAXIN) 500 MG tablet Take 1 tablet (500 mg total) by mouth every 6 (six) hours as needed for muscle spasms. (Patient not taking: Reported on 10/13/2017) 40 tablet 1  . oxyCODONE (OXY IR/ROXICODONE) 5 MG immediate release tablet Take 1-2 tablets (5-10 mg total) by mouth every 4 (four) hours as needed for moderate pain. (Patient not taking: Reported on  10/13/2017) 80 tablet 0  . polyethylene glycol powder (GLYCOLAX/MIRALAX) powder Take 17 g by mouth once. Dissolve  one capful of powder into liquid and take once daily. (Patient not taking: Reported on 03/03/2016) 500 g 0   No facility-administered medications prior to visit.     Review of Systems  Constitutional: Positive for fever and weight loss.  HENT: Positive for congestion, ear pain, nosebleeds and sore throat.   Eyes: Positive for redness.  Respiratory: Positive for cough. Negative for shortness of breath and wheezing.   Cardiovascular: Positive for palpitations, leg swelling and PND.  Gastrointestinal: Positive for nausea and vomiting.  Genitourinary: Positive for dysuria.  Skin: Positive for rash.  Neurological: Positive for headaches.  Endo/Heme/Allergies: Bruises/bleeds easily.  Psychiatric/Behavioral: Positive for depression. The patient is nervous/anxious.       Objective:  Physical Exam   Vitals:   10/13/17 1352  BP: 116/76  Pulse: 74  SpO2: 100%  Weight: 185 lb (83.9 kg)  Height: 5\' 10"  (1.778 m)   RA  Gen: well appearing, no acute distress HENT: NCAT, OP clear, neck supple without masses Eyes: PERRL, EOMi Lymph: no cervical lymphadenopathy PULM: CTA B CV: RRR, no mgr, no JVD GI: BS+, soft, nontender, no hsm Derm: no rash or skin breakdown MSK: normal bulk and tone Neuro: A&Ox4, CN II-XII intact, strength 5/5 in all 4 extremities Psyche: normal mood and affect   CBC    Component Value Date/Time   WBC 7.4 06/25/2015 2110   RBC 4.61 06/25/2015 2110   HGB 13.9 06/25/2015 2110   HCT 40.1 06/25/2015 2110   PLT 199 06/25/2015 2110   MCV 87.0 06/25/2015 2110   MCH 30.2 06/25/2015 2110   MCHC 34.7 06/25/2015 2110   RDW 12.2 06/25/2015 2110   LYMPHSABS 1.6 06/25/2015 2110   MONOABS 0.6 06/25/2015 2110   EOSABS 0.0 06/25/2015 2110   BASOSABS 0.0 06/25/2015 2110     Chest imaging: June 2019 chest x-ray images independently reviewed showing  normal pulmonary parenchyma, no evidence of an underlying nodule or infiltrate.  PFT:  Exhaled NO: 09/2017 24 ppm  Labs:  Path:  Echo:  Heart Catheterization:       Assessment & Plan:   Wheezing - Plan: Nitric oxide  Abnormal CXR  Discussion: 57 year old female comes complaining of 2 problems: Wheezing as well as an abnormal chest x-ray.  In regards to the wheezing she has associated allergic rhinitis symptoms and a sensation that the wheezing is coming from her throat so I wonder if this is just laryngeal irritation from allergic rhinitis.  She is had an extensive work-up by the allergist already so I am going to request records from them.  Today her exhaled nitric oxide test was only modestly elevated so it is not clear to me that she has allergic asthma yet.  She may have exercise-induced bronchospasm and the first-line treatment for that is just using albuterol which I have encouraged her to do.  Given her history of sarcoma any abnormality seen on the chest x-ray should be followed up so I agree with plans for a CT scan of her chest.  We will see her back after that.  Plan: Abnormal chest x-ray, question of soft tissue abnormality versus lung infiltrate: I agree with plans for a CT scan, if there are no arrangements for this please let us know and we will order the test ourselves  Wheezing: We will request records from your evaluation at the [facility] Please use albuterol 2 puffs 10 minutes prior to exercise to see if this helps with the symptoms  Follow-up with [place] in about 2 weeks or sooner if needed    Current Outpatient Medications:  .  b complex vitamins capsule, Take 1 capsule by mouth daily.  , Disp: , Rfl:  .  Cholecalciferol (VITAMIN D3) 10000 units TABS, Take 1,000 Units by mouth daily., Disp: , Rfl:  .  estradiol (ESTRACE) 1 MG tablet, Take 0.5 mg by mouth daily., Disp: , Rfl:  .  ibuprofen (ADVIL,MOTRIN) 200 MG tablet, Take 800 mg by  mouth every 6 (six) hours as needed. For pain, Disp: , Rfl:  .  levocetirizine (XYZAL) 5 MG tablet, Take 2.5 mg by mouth every evening., Disp: , Rfl:  .  lidocaine (LIDODERM) 5 %, Place 1 patch onto the skin daily as needed (For pain.). , Disp: , Rfl:  .  Multiple Vitamin (MULTIVITAMIN WITH MINERALS) TABS tablet, Take 1 tablet by mouth daily., Disp: , Rfl:  .  SUMAtriptan (IMITREX) 100 MG tablet, Take 100 mg by mouth every 2 (two) hours as needed for migraine or headache. , Disp: , Rfl:  .  tretinoin (RETIN-A) 0.05 % cream, Apply 1 application topically at bedtime. Apply sparingly., Disp: , Rfl:

## 2017-10-21 ENCOUNTER — Other Ambulatory Visit: Payer: BLUE CROSS/BLUE SHIELD

## 2017-10-22 ENCOUNTER — Ambulatory Visit
Admission: RE | Admit: 2017-10-22 | Discharge: 2017-10-22 | Disposition: A | Discharge status: Home / Self Care | Payer: BLUE CROSS/BLUE SHIELD | Source: Ambulatory Visit | Attending: Family Medicine | Admitting: Family Medicine

## 2017-10-22 DIAGNOSIS — R9389 Abnormal findings on diagnostic imaging of other specified body structures: Secondary | ICD-10-CM

## 2017-10-22 DIAGNOSIS — R0602 Shortness of breath: Secondary | ICD-10-CM | POA: Diagnosis not present

## 2017-10-26 ENCOUNTER — Telehealth: Payer: Self-pay | Admitting: Pulmonary Disease

## 2017-10-26 NOTE — Telephone Encounter (Signed)
lmtcb for pt.  

## 2017-10-27 NOTE — Telephone Encounter (Signed)
Report: 1. No acute or focal pulmonary disease to explain the patient's symptoms or chest x-ray findings. 2. Mild pleural thickening bilaterally may be related to prior infection or inflammation.

## 2017-10-27 NOTE — Telephone Encounter (Signed)
Called and spoke with pt regarding CT scan on 10/22/17 Pt would like results, at this time I do not see results for CT scan Advised pt once we have the results we will call her  BQ please advise.

## 2017-10-28 ENCOUNTER — Ambulatory Visit: Payer: BLUE CROSS/BLUE SHIELD | Admitting: Pulmonary Disease

## 2017-10-28 NOTE — Telephone Encounter (Signed)
Spoke with pt, aware of results/recs.  Nothing further needed.  

## 2017-10-31 NOTE — Progress Notes (Signed)
@Patient  ID: Michelle Contreras, female    DOB: 1960-08-10, 57 y.o.   MRN: [id number]  Chief Complaint  Patient presents with  . Follow-up    Breathing improved     Referring provider: Aletha Halim PA-C  HPI: 57 year old female non-smoker patient self-referred to our office on 10/14/2017 for wheezing. Past medical history: Sarcoma and right leg requiring amputation Patient of Dr. Lake Bells  Recent [facility] Encounters:   10/13/2017-initial office visit-Mcquaid 57 year old female is a lifelong non-smoker is coming in for evaluation of abnormal chest x-ray.  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  Chest x-ray in December 2019 after patient developed some wheezing on exertion showed questionable abnormality and soft tissue overlying the right lung versus perhaps possible pneumonia.  Patient reports she has been previously evaluated by an allergist.  She was reported to have allergies to cockroach and dust.  Patient said there is a breathing test performed that day with the allergist that she was given a prescription of Symbicort but she is not sure if she has asthma.  Patient reports she never had asthma as a kid.  Lanice Shirts today is 23, June/2019 chest x-ray showing normal normal pulmonary parenchyma.  No evidence of nodular infiltrate. Plan: CT of chest without contrast, follow-up in 2 weeks  Tests:  10/22/2017-CT chest without contrast- no acute or focal pulmonary disease to explain patient's symptoms or chest x-ray findings, mild pleural thickening bilaterally may be related to prior infection or inflammation 09/15/2017-chest x-ray-mild asymmetric haziness over the right upper lobe felt to be due to overlying soft tissue structures, if there is continued clinical concern for subtle pneumonia CT chest without contrast is recommended   11/01/17 Follow-up 57 year old patient seen today for follow-up visit.  CT results  showed some pleural thickening probably most related to inflammation or resolving infection.  Patient reporting today that her respiratory symptoms are well managed.  Patient has switch from Zyrtec to half tablet of methimazole.  Patient reports that her allergy symptoms have been much more controlled since then.  Patient reports she has not had any wheezing or shortness of breath.  She does admit to occasional wheezing with significant physical exertion but even this is improving.  Patient is not on an ICS, patient never started this after it was prescribed by her allergist.   Allergies  Allergen Reactions  . Adhesive [Tape] Other (See Comments)    Patient reports full thickness blister to adhesive - has to use paper tape  . Tramadol Nausea And Vomiting  . Other Rash and Other (See Comments)    Topical Agents like lotions - blisters    Immunization History  Administered Date(s) Administered  . Influenza, Quadrivalent, Recombinant, Inj, Pf 02/15/2017  . Influenza,inj,Quad PF,6+ Mos 01/16/2016  . Tdap 07/11/2009    Past Medical History:  Diagnosis Date  . Arthritis   . GERD (gastroesophageal reflux disease)   . Headache    migraines  . HSV (herpes simplex virus) anogenital infection    Hx of  . PONV (postoperative nausea and vomiting)   . Sarcoma ([place])    Lower Extremitiy    Tobacco History: Social History   Tobacco Use  Smoking Status Passive Smoke Exposure - Never Smoker  Smokeless Tobacco Never Used   Counseling given: Not Answered Continue not smoking  Outpatient Encounter Medications as of 11/01/2017  Medication Sig  . b complex vitamins capsule Take 1 capsule by mouth daily.    Marland Kitchen  Cholecalciferol (VITAMIN D3) 10000 units TABS Take 1,000 Units by mouth daily.  Marland Kitchen estradiol (ESTRACE) 1 MG tablet Take 0.5 mg by mouth daily.  Marland Kitchen ibuprofen (ADVIL,MOTRIN) 200 MG tablet Take 800 mg by mouth every 6 (six) hours as needed. For pain  . levocetirizine (XYZAL) 5 MG tablet Take  2.5 mg by mouth every evening.  . lidocaine (LIDODERM) 5 % Place 1 patch onto the skin daily as needed (For pain.).   Marland Kitchen Multiple Vitamin (MULTIVITAMIN WITH MINERALS) TABS tablet Take 1 tablet by mouth daily.  . SUMAtriptan (IMITREX) 100 MG tablet Take 100 mg by mouth every 2 (two) hours as needed for migraine or headache.   . tretinoin (RETIN-A) 0.05 % cream Apply 1 application topically at bedtime. Apply sparingly.   No facility-administered encounter medications on file as of 11/01/2017.      Review of Systems  Review of Systems  Constitutional: Negative for chills, fatigue, fever and unexpected weight change.  HENT: Negative for congestion, ear pain, postnasal drip, rhinorrhea, sinus pressure, sinus pain, sneezing and sore throat.   Respiratory: Negative for cough, chest tightness, shortness of breath and wheezing.   Cardiovascular: Negative for chest pain and palpitations.  Gastrointestinal: Negative for blood in stool, diarrhea, nausea and vomiting.  Genitourinary: Negative for dysuria, frequency and urgency.  Musculoskeletal: Negative for arthralgias.  Skin: Negative for color change.  Allergic/Immunologic: Negative for environmental allergies and food allergies.  Neurological: Negative for dizziness, light-headedness and headaches.  Psychiatric/Behavioral: Negative for dysphoric mood. The patient is not nervous/anxious.      Physical Exam  BP 118/82   Pulse 75   Ht 5\' 10"  (1.778 m)   Wt 188 lb 6.4 oz (85.5 kg)   SpO2 98%   BMI 27.03 kg/m   Wt Readings from Last 5 Encounters:  11/01/17 188 lb 6.4 oz (85.5 kg)  10/13/17 185 lb (83.9 kg)  07/17/15 160 lb (72.6 kg)  07/11/15 160 lb (72.6 kg)  09/22/11 174 lb 6.4 oz (79.1 kg)     Physical Exam  Constitutional: She is oriented to person, place, and time and well-developed, well-nourished, and in no distress. No distress.  HENT:  Head: Normocephalic and atraumatic.  Right Ear: Hearing, tympanic membrane, external ear  and ear canal normal.  Left Ear: Hearing, tympanic membrane, external ear and ear canal normal.  Mouth/Throat: Uvula is midline and oropharynx is clear and moist. No oropharyngeal exudate.  Bilateral TM effusions without infection  Eyes: Pupils are equal, round, and reactive to light.  Neck: Normal range of motion. Neck supple. No JVD present.  Cardiovascular: Normal rate, regular rhythm and normal heart sounds.  Pulmonary/Chest: Effort normal and breath sounds normal. No accessory muscle usage. No respiratory distress. She has no decreased breath sounds. She has no wheezes. She has no rhonchi.  Abdominal: Soft. Bowel sounds are normal.  Musculoskeletal: Normal range of motion. She exhibits no edema.  Lymphadenopathy:    She has no cervical adenopathy.  Neurological: She is alert and oriented to person, place, and time. Gait normal.  Skin: Skin is warm and dry. She is not diaphoretic. No erythema.  Psychiatric: Mood, memory, affect and judgment normal.  Nursing note and vitals reviewed.    Lab Results:  CBC    Component Value Date/Time   WBC 7.4 06/25/2015 2110   RBC 4.61 06/25/2015 2110   HGB 13.9 06/25/2015 2110   HCT 40.1 06/25/2015 2110   PLT 199 06/25/2015 2110   MCV 87.0 06/25/2015 2110  MCH 30.2 06/25/2015 2110   MCHC 34.7 06/25/2015 2110   RDW 12.2 06/25/2015 2110   LYMPHSABS 1.6 06/25/2015 2110   MONOABS 0.6 06/25/2015 2110   EOSABS 0.0 06/25/2015 2110   BASOSABS 0.0 06/25/2015 2110    BMET    Component Value Date/Time   NA 133 (L) 06/25/2015 2110   K 4.0 06/25/2015 2110   CL 98 (L) 06/25/2015 2110   CO2 27 06/25/2015 2110   GLUCOSE 110 (H) 06/25/2015 2110   BUN 16 06/25/2015 2110   CREATININE 0.65 06/25/2015 2110   CALCIUM 8.8 (L) 06/25/2015 2110   GFRNONAA >60 06/25/2015 2110   GFRAA >60 06/25/2015 2110    BNP No results found for: BNP  ProBNP No results found for: PROBNP  Imaging: Ct Chest Wo Contrast  Result Date: 10/22/2017 CLINICAL DATA:   Abnormal chest x-ray. Cough and wheezing. Shortness of breath. Personal history of the lower extremities are,. EXAM: CT CHEST WITHOUT CONTRAST TECHNIQUE: Multidetector CT imaging of the chest was performed following the standard protocol without IV contrast. COMPARISON:  Two-view chest x-ray 09/15/2017. FINDINGS: Cardiovascular: The heart size is normal. Aorta and great vessels are within normal limits. Pulmonary arteries are unremarkable. Mediastinum/Nodes: No significant distal, axillary, or hilar adenopathy is present. Esophagus is normal. There is some heterogeneity of the thyroid without a dominant lesion. Lungs/Pleura: Lungs are clear. There is no focal nodule, mass, or airspace disease. There is some pleural thickening posteriorly in both lungs, right greater than left. Upper Abdomen: Cholecystectomy is noted. No focal lesions are evident. Musculoskeletal: Multilevel endplate Schmorl's nodes are present. Vertebral body heights are otherwise maintained. Alignment is anatomic. Anterior cervical fusion is noted. IMPRESSION: 1. No acute or focal pulmonary disease to explain the patient's symptoms or chest x-ray findings. 2. Mild pleural thickening bilaterally may be related to prior infection or inflammation. Electronically Signed   By: San Morelle M.D.   On: 10/22/2017 16:31     Assessment & Plan:   Pleasant 57 year old patient seen in office visit today.  Patient reporting that her symptoms are much more controlled on her half tablet of size all.  Patient has not had to use her rescue inhaler at all since last being seen.  Reviewed CT results.  Discussed with patient that with her dust mite allergy she may see worsening symptoms in November or December.  Continue Xyzal.  If symptoms worsen or needing to use rescue inhaler more often to present to our office.  If not if symptoms remain stable and managed well that we can follow-up in 1 year.  Allergic rhinitis Stable today controlled on  Xyzal.  Continue daily Xyzal.   If rescue inhaler as needed more than 2 times a week please contact our office and schedule a follow-up appointment.  Dust Mite Allergy  >>>levels of these increase in November / December  >>>this can heighten your bronchial hypersensitivity and increase chance of respiratory exacerbation  >>> Treatment options: Can get dust mite covers, dry pillows weekly and dryer, in case mattress with cover, cleaning carpets regularly if you have them      Lauraine Rinne, NP 11/01/2017

## 2017-11-01 ENCOUNTER — Encounter: Payer: Self-pay | Admitting: Pulmonary Disease

## 2017-11-01 ENCOUNTER — Ambulatory Visit (INDEPENDENT_AMBULATORY_CARE_PROVIDER_SITE_OTHER): Payer: BLUE CROSS/BLUE SHIELD | Admitting: Pulmonary Disease

## 2017-11-01 DIAGNOSIS — J309 Allergic rhinitis, unspecified: Secondary | ICD-10-CM | POA: Diagnosis not present

## 2017-11-01 NOTE — Patient Instructions (Signed)
Continue xyzal half tablet   Follow-up with our office in 1 year  Present to our office sooner if you are having worsening respiratory symptoms  Dust Mite Allergy  >>>levels of these increase in November / December  >>>this can heighten your bronchial hypersensitivity and increase chance of respiratory exacerbation  >>> Treatment options: Can get dust mite covers, dry pillows weekly and dryer, in case mattress with cover, cleaning carpets regularly if you have them    Please contact the office if your symptoms worsen or you have concerns that you are not improving.   Thank you for choosing [facility] for your healthcare, and for allowing [place] to partner with you on your healthcare journey. I am thankful to be able to provide care to you today.   Wyn Quaker FNP-C

## 2017-11-01 NOTE — Assessment & Plan Note (Signed)
Stable today controlled on Xyzal.  Continue daily Xyzal.   If rescue inhaler as needed more than 2 times a week please contact our office and schedule a follow-up appointment.  Dust Mite Allergy  >>>levels of these increase in November / December  >>>this can heighten your bronchial hypersensitivity and increase chance of respiratory exacerbation  >>> Treatment options: Can get dust mite covers, dry pillows weekly and dryer, in case mattress with cover, cleaning carpets regularly if you have them

## 2017-11-09 NOTE — Progress Notes (Signed)
Reviewed, agree 

## 2017-12-23 DIAGNOSIS — J301 Allergic rhinitis due to pollen: Secondary | ICD-10-CM | POA: Diagnosis not present

## 2017-12-24 DIAGNOSIS — J3089 Other allergic rhinitis: Secondary | ICD-10-CM | POA: Diagnosis not present

## 2018-01-10 DIAGNOSIS — J3089 Other allergic rhinitis: Secondary | ICD-10-CM | POA: Diagnosis not present

## 2018-01-10 DIAGNOSIS — J301 Allergic rhinitis due to pollen: Secondary | ICD-10-CM | POA: Diagnosis not present

## 2018-01-12 DIAGNOSIS — Z23 Encounter for immunization: Secondary | ICD-10-CM | POA: Diagnosis not present

## 2018-01-12 DIAGNOSIS — J3089 Other allergic rhinitis: Secondary | ICD-10-CM | POA: Diagnosis not present

## 2018-01-12 DIAGNOSIS — J301 Allergic rhinitis due to pollen: Secondary | ICD-10-CM | POA: Diagnosis not present

## 2018-01-14 DIAGNOSIS — J3089 Other allergic rhinitis: Secondary | ICD-10-CM | POA: Diagnosis not present

## 2018-01-14 DIAGNOSIS — J301 Allergic rhinitis due to pollen: Secondary | ICD-10-CM | POA: Diagnosis not present

## 2018-01-18 DIAGNOSIS — J3089 Other allergic rhinitis: Secondary | ICD-10-CM | POA: Diagnosis not present

## 2018-01-18 DIAGNOSIS — J301 Allergic rhinitis due to pollen: Secondary | ICD-10-CM | POA: Diagnosis not present

## 2018-01-20 DIAGNOSIS — J3089 Other allergic rhinitis: Secondary | ICD-10-CM | POA: Diagnosis not present

## 2018-01-20 DIAGNOSIS — J301 Allergic rhinitis due to pollen: Secondary | ICD-10-CM | POA: Diagnosis not present

## 2018-01-24 DIAGNOSIS — J301 Allergic rhinitis due to pollen: Secondary | ICD-10-CM | POA: Diagnosis not present

## 2018-01-24 DIAGNOSIS — J3089 Other allergic rhinitis: Secondary | ICD-10-CM | POA: Diagnosis not present

## 2018-01-28 DIAGNOSIS — J301 Allergic rhinitis due to pollen: Secondary | ICD-10-CM | POA: Diagnosis not present

## 2018-01-28 DIAGNOSIS — J3089 Other allergic rhinitis: Secondary | ICD-10-CM | POA: Diagnosis not present

## 2018-02-02 DIAGNOSIS — J301 Allergic rhinitis due to pollen: Secondary | ICD-10-CM | POA: Diagnosis not present

## 2018-02-02 DIAGNOSIS — J3089 Other allergic rhinitis: Secondary | ICD-10-CM | POA: Diagnosis not present

## 2018-02-04 DIAGNOSIS — J3089 Other allergic rhinitis: Secondary | ICD-10-CM | POA: Diagnosis not present

## 2018-02-04 DIAGNOSIS — J301 Allergic rhinitis due to pollen: Secondary | ICD-10-CM | POA: Diagnosis not present

## 2018-02-09 DIAGNOSIS — J3089 Other allergic rhinitis: Secondary | ICD-10-CM | POA: Diagnosis not present

## 2018-02-09 DIAGNOSIS — J301 Allergic rhinitis due to pollen: Secondary | ICD-10-CM | POA: Diagnosis not present

## 2018-02-11 DIAGNOSIS — J3089 Other allergic rhinitis: Secondary | ICD-10-CM | POA: Diagnosis not present

## 2018-02-11 DIAGNOSIS — J301 Allergic rhinitis due to pollen: Secondary | ICD-10-CM | POA: Diagnosis not present

## 2018-02-14 DIAGNOSIS — J301 Allergic rhinitis due to pollen: Secondary | ICD-10-CM | POA: Diagnosis not present

## 2018-02-14 DIAGNOSIS — J3089 Other allergic rhinitis: Secondary | ICD-10-CM | POA: Diagnosis not present

## 2018-02-21 DIAGNOSIS — J3089 Other allergic rhinitis: Secondary | ICD-10-CM | POA: Diagnosis not present

## 2018-02-21 DIAGNOSIS — J301 Allergic rhinitis due to pollen: Secondary | ICD-10-CM | POA: Diagnosis not present

## 2018-02-24 DIAGNOSIS — J3089 Other allergic rhinitis: Secondary | ICD-10-CM | POA: Diagnosis not present

## 2018-02-24 DIAGNOSIS — J301 Allergic rhinitis due to pollen: Secondary | ICD-10-CM | POA: Diagnosis not present

## 2018-02-28 DIAGNOSIS — E559 Vitamin D deficiency, unspecified: Secondary | ICD-10-CM | POA: Diagnosis not present

## 2018-02-28 DIAGNOSIS — Z0000 Encounter for general adult medical examination without abnormal findings: Secondary | ICD-10-CM | POA: Diagnosis not present

## 2018-03-02 DIAGNOSIS — J301 Allergic rhinitis due to pollen: Secondary | ICD-10-CM | POA: Diagnosis not present

## 2018-03-02 DIAGNOSIS — J3089 Other allergic rhinitis: Secondary | ICD-10-CM | POA: Diagnosis not present

## 2018-03-04 ENCOUNTER — Encounter: Payer: Self-pay | Admitting: Gastroenterology

## 2018-03-04 DIAGNOSIS — Z23 Encounter for immunization: Secondary | ICD-10-CM | POA: Diagnosis not present

## 2018-03-04 DIAGNOSIS — J301 Allergic rhinitis due to pollen: Secondary | ICD-10-CM | POA: Diagnosis not present

## 2018-03-04 DIAGNOSIS — J3089 Other allergic rhinitis: Secondary | ICD-10-CM | POA: Diagnosis not present

## 2018-03-04 DIAGNOSIS — Z1211 Encounter for screening for malignant neoplasm of colon: Secondary | ICD-10-CM | POA: Diagnosis not present

## 2018-03-04 DIAGNOSIS — J0100 Acute maxillary sinusitis, unspecified: Secondary | ICD-10-CM | POA: Diagnosis not present

## 2018-03-04 DIAGNOSIS — Z0000 Encounter for general adult medical examination without abnormal findings: Secondary | ICD-10-CM | POA: Diagnosis not present

## 2018-03-07 DIAGNOSIS — J3089 Other allergic rhinitis: Secondary | ICD-10-CM | POA: Diagnosis not present

## 2018-03-07 DIAGNOSIS — J301 Allergic rhinitis due to pollen: Secondary | ICD-10-CM | POA: Diagnosis not present

## 2018-03-10 DIAGNOSIS — J301 Allergic rhinitis due to pollen: Secondary | ICD-10-CM | POA: Diagnosis not present

## 2018-03-10 DIAGNOSIS — J3089 Other allergic rhinitis: Secondary | ICD-10-CM | POA: Diagnosis not present

## 2018-03-18 DIAGNOSIS — J301 Allergic rhinitis due to pollen: Secondary | ICD-10-CM | POA: Diagnosis not present

## 2018-03-18 DIAGNOSIS — J3089 Other allergic rhinitis: Secondary | ICD-10-CM | POA: Diagnosis not present

## 2018-03-29 ENCOUNTER — Encounter: Payer: Self-pay | Admitting: Gastroenterology

## 2018-03-29 ENCOUNTER — Other Ambulatory Visit: Payer: Self-pay

## 2018-03-29 ENCOUNTER — Ambulatory Visit (AMBULATORY_SURGERY_CENTER): Payer: Self-pay | Admitting: *Deleted

## 2018-03-29 VITALS — Ht 69.0 in | Wt 186.0 lb

## 2018-03-29 DIAGNOSIS — Z800 Family history of malignant neoplasm of digestive organs: Secondary | ICD-10-CM

## 2018-03-29 DIAGNOSIS — J3089 Other allergic rhinitis: Secondary | ICD-10-CM | POA: Diagnosis not present

## 2018-03-29 DIAGNOSIS — J301 Allergic rhinitis due to pollen: Secondary | ICD-10-CM | POA: Diagnosis not present

## 2018-03-29 DIAGNOSIS — Z86010 Personal history of colonic polyps: Secondary | ICD-10-CM

## 2018-03-29 MED ORDER — PEG-KCL-NACL-NASULF-NA ASC-C 140 G PO SOLR: 1.0000 | Freq: Once | ORAL | 0 refills | Status: AC

## 2018-03-29 NOTE — Progress Notes (Signed)
Patient denies any allergies to egg or soy products. Patient denies complications with anesthesia/sedation. (had one episode of n/v with only one surgical procedure). Patient denies oxygen use at home and denies diet medications.  Patient denied information on colonoscopy.

## 2018-04-01 ENCOUNTER — Telehealth: Payer: Self-pay | Admitting: Gastroenterology

## 2018-04-01 DIAGNOSIS — J301 Allergic rhinitis due to pollen: Secondary | ICD-10-CM | POA: Diagnosis not present

## 2018-04-01 DIAGNOSIS — J3089 Other allergic rhinitis: Secondary | ICD-10-CM | POA: Diagnosis not present

## 2018-04-01 NOTE — Telephone Encounter (Signed)
Patient states that her prep requires PA states that she is scheduled for this Tuesday 01/14

## 2018-04-01 NOTE — Telephone Encounter (Signed)
Spoke with patient. Explained to her that we do not do PA on Plenvu. Told patient she can go to the plenvu website  To get a coupon to bring the price down to $50 or less. Offered her to come here to get a coupon or change prep and come get new instructions but she does not live near here and did not want to come. I explained to her that I will call the pharmacy to tell them to fill the Plenvu and will notify them she will bring in coupon. Pt agrees with this plan. I also explained why we use this prep. Encouraged patient to call us back with any concerns.

## 2018-04-01 NOTE — Telephone Encounter (Signed)
Pharmacy notified to fill Plenvu and patient will bring in coupon. Also notified them we do not do PA on this medication.

## 2018-04-04 DIAGNOSIS — J301 Allergic rhinitis due to pollen: Secondary | ICD-10-CM | POA: Diagnosis not present

## 2018-04-04 DIAGNOSIS — D485 Neoplasm of uncertain behavior of skin: Secondary | ICD-10-CM | POA: Diagnosis not present

## 2018-04-04 DIAGNOSIS — J3089 Other allergic rhinitis: Secondary | ICD-10-CM | POA: Diagnosis not present

## 2018-04-05 ENCOUNTER — Encounter: Payer: Self-pay | Admitting: Gastroenterology

## 2018-04-05 ENCOUNTER — Ambulatory Visit (AMBULATORY_SURGERY_CENTER): Payer: BLUE CROSS/BLUE SHIELD | Admitting: Gastroenterology

## 2018-04-05 VITALS — BP 101/55 | HR 63 | Temp 97.8°F | Resp 12 | Ht 69.0 in | Wt 186.0 lb

## 2018-04-05 DIAGNOSIS — Z1211 Encounter for screening for malignant neoplasm of colon: Secondary | ICD-10-CM | POA: Diagnosis not present

## 2018-04-05 DIAGNOSIS — K635 Polyp of colon: Secondary | ICD-10-CM

## 2018-04-05 DIAGNOSIS — Z800 Family history of malignant neoplasm of digestive organs: Secondary | ICD-10-CM | POA: Diagnosis not present

## 2018-04-05 DIAGNOSIS — D123 Benign neoplasm of transverse colon: Secondary | ICD-10-CM

## 2018-04-05 MED ORDER — SODIUM CHLORIDE 0.9 % IV SOLN: 500.0000 mL | Freq: Once | INTRAVENOUS | Status: DC

## 2018-04-05 NOTE — Progress Notes (Signed)
Called to room to assist during endoscopic procedure.  Patient ID and intended procedure confirmed with present staff. Received instructions for my participation in the procedure from the performing physician.  

## 2018-04-05 NOTE — Progress Notes (Signed)
Pt's states no medical or surgical changes since previsit or office visit. 

## 2018-04-05 NOTE — Progress Notes (Signed)
PT taken to PACU. Monitors in place. VSS. Report given to RN. 

## 2018-04-05 NOTE — Op Note (Signed)
[facility] Patient Name: Michelle Contreras Procedure Date: 04/05/2018 11:27 AM MRN: [id number] Endoscopist: Mallie Mussel L. Loletha Carrow , MD Age: 58 Referring MD:  Date of Birth: [birth year] Gender: Female Account #: [id number] Procedure:                Colonoscopy Indications:              Screening in patient at increased risk: suspected                            Colorectal cancer in father 31 or older (in setting                            of additional malignancies); no polyps on 2012                            colonoscopy Medicines:                Monitored Anesthesia Care Procedure:                Pre-Anesthesia Assessment:                           - Prior to the procedure, a History and Physical                            was performed, and patient medications and                            allergies were reviewed. The patient's tolerance of                            previous anesthesia was also reviewed. The risks                            and benefits of the procedure and the sedation                            options and risks were discussed with the patient.                            All questions were answered, and informed consent                            was obtained. Prior Anticoagulants: The patient has                            taken no previous anticoagulant or antiplatelet                            agents. ASA Grade Assessment: II - A patient with                            mild systemic disease. After reviewing the risks  and benefits, the patient was deemed in                            satisfactory condition to undergo the procedure.                           After obtaining informed consent, the colonoscope                            was passed under direct vision. Throughout the                            procedure, the patient's blood pressure, pulse, and                            oxygen saturations were monitored continuously.  The                            Colonoscope was introduced through the anus and                            advanced to the the cecum, identified by                            appendiceal orifice and ileocecal valve. The                            colonoscopy was performed without difficulty. The                            patient tolerated the procedure well. The quality                            of the bowel preparation was excellent. The                            ileocecal valve, appendiceal orifice, and rectum                            were photographed. Scope In: 11:37:10 AM Scope Out: 11:53:31 AM Scope Withdrawal Time: 0 hours 9 minutes 42 seconds  Total Procedure Duration: 0 hours 16 minutes 21 seconds  Findings:                 The perianal and digital rectal examinations were                            normal.                           A 3 mm polyp was found in the distal transverse                            colon. The polyp was sessile. The polyp was removed  with a cold biopsy forceps. Resection and retrieval                            were complete.                           The exam was otherwise without abnormality on                            direct and retroflexion views. Complications:            No immediate complications. Estimated Blood Loss:     Estimated blood loss was minimal. Impression:               - One 3 mm polyp in the distal transverse colon,                            removed with a cold biopsy forceps. Resected and                            retrieved.                           - The examination was otherwise normal on direct                            and retroflexion views. Recommendation:           - Patient has a contact number available for                            emergencies. The signs and symptoms of potential                            delayed complications were discussed with the                             patient. Return to normal activities tomorrow.                            Written discharge instructions were provided to the                            patient.                           - Resume previous diet.                           - Continue present medications.                           - Await pathology results.                           - Repeat colonoscopy is recommended for  surveillance. The colonoscopy date will be                            determined after pathology results from today's                            exam become available for review. If polyp not                            adenomatous or SSP, repeat colonoscopy in 10 years                            (patient reported that father most likely did not                            have colon cancer). Yostin Malacara L. Loletha Carrow, MD 04/05/2018 12:01:56 PM This report has been signed electronically.

## 2018-04-05 NOTE — Patient Instructions (Signed)
Thank you for allowing [place] to care for you today!  Await pathology results by mail, recommendation for next colonoscopy will be made at that time  Resume previous diet and medications today.  Return to normal activities tomorrow.    YOU HAD AN ENDOSCOPIC PROCEDURE TODAY AT [facility]:   Refer to the procedure report that was given to you for any specific questions about what was found during the examination.  If the procedure report does not answer your questions, please call your gastroenterologist to clarify.  If you requested that your care partner not be given the details of your procedure findings, then the procedure report has been included in a sealed envelope for you to review at your convenience later.  YOU SHOULD EXPECT: Some feelings of bloating in the abdomen. Passage of more gas than usual.  Walking can help get rid of the air that was put into your GI tract during the procedure and reduce the bloating. If you had a lower endoscopy (such as a colonoscopy or flexible sigmoidoscopy) you may notice spotting of blood in your stool or on the toilet paper. If you underwent a bowel prep for your procedure, you may not have a normal bowel movement for a few days.  Please Note:  You might notice some irritation and congestion in your nose or some drainage.  This is from the oxygen used during your procedure.  There is no need for concern and it should clear up in a day or so.  SYMPTOMS TO REPORT IMMEDIATELY:   Following lower endoscopy (colonoscopy or flexible sigmoidoscopy):  Excessive amounts of blood in the stool  Significant tenderness or worsening of abdominal pains  Swelling of the abdomen that is new, acute  Fever of 100F or higher   For urgent or emergent issues, a gastroenterologist can be reached at any hour by calling [phone].   DIET:  We do recommend a small meal at first, but then you may proceed to your regular diet.  Drink plenty of fluids but  you should avoid alcoholic beverages for 24 hours.  ACTIVITY:  You should plan to take it easy for the rest of today and you should NOT DRIVE or use heavy machinery until tomorrow (because of the sedation medicines used during the test).    FOLLOW UP: Our staff will call the number listed on your records the next business day following your procedure to check on you and address any questions or concerns that you may have regarding the information given to you following your procedure. If we do not reach you, we will leave a message.  However, if you are feeling well and you are not experiencing any problems, there is no need to return our call.  We will assume that you have returned to your regular daily activities without incident.  If any biopsies were taken you will be contacted by phone or by letter within the next 1-3 weeks.  Please call us at [phone] if you have not heard about the biopsies in 3 weeks.    SIGNATURES/CONFIDENTIALITY: You and/or your care partner have signed paperwork which will be entered into your electronic medical record.  These signatures attest to the fact that that the information above on your After Visit Summary has been reviewed and is understood.  Full responsibility of the confidentiality of this discharge information lies with you and/or your care-partner.

## 2018-04-06 ENCOUNTER — Telehealth: Payer: Self-pay

## 2018-04-06 ENCOUNTER — Telehealth: Payer: Self-pay | Admitting: *Deleted

## 2018-04-06 NOTE — Telephone Encounter (Signed)
  Follow up Call-  Call back number 04/05/2018  Post procedure Call Back phone  # [phone]  Permission to leave phone message Yes  Some recent data might be hidden     Patient questions:  Left message to call us if necessary.

## 2018-04-06 NOTE — Telephone Encounter (Signed)
  Follow up Call-  Call back number 04/05/2018  Post procedure Call Back phone  # [phone]  Permission to leave phone message Yes  Some recent data might be hidden     Patient questions:  Do you have a fever, pain , or abdominal swelling? No. Pain Score  0 *  Have you tolerated food without any problems? Yes.    Have you been able to return to your normal activities? Yes.    Do you have any questions about your discharge instructions: Diet   No. Medications  No. Follow up visit  No.  Do you have questions or concerns about your Care? No.  Actions: * If pain score is 4 or above: No action needed, pain <4.

## 2018-04-13 ENCOUNTER — Encounter: Payer: Self-pay | Admitting: Gastroenterology

## 2018-04-14 DIAGNOSIS — J3089 Other allergic rhinitis: Secondary | ICD-10-CM | POA: Diagnosis not present

## 2018-04-14 DIAGNOSIS — J301 Allergic rhinitis due to pollen: Secondary | ICD-10-CM | POA: Diagnosis not present

## 2018-04-20 DIAGNOSIS — J301 Allergic rhinitis due to pollen: Secondary | ICD-10-CM | POA: Diagnosis not present

## 2018-04-20 DIAGNOSIS — J3089 Other allergic rhinitis: Secondary | ICD-10-CM | POA: Diagnosis not present

## 2018-04-26 DIAGNOSIS — J3081 Allergic rhinitis due to animal (cat) (dog) hair and dander: Secondary | ICD-10-CM | POA: Diagnosis not present

## 2018-04-26 DIAGNOSIS — J301 Allergic rhinitis due to pollen: Secondary | ICD-10-CM | POA: Diagnosis not present

## 2018-04-26 DIAGNOSIS — J3089 Other allergic rhinitis: Secondary | ICD-10-CM | POA: Diagnosis not present

## 2018-04-29 DIAGNOSIS — J301 Allergic rhinitis due to pollen: Secondary | ICD-10-CM | POA: Diagnosis not present

## 2018-04-29 DIAGNOSIS — J3089 Other allergic rhinitis: Secondary | ICD-10-CM | POA: Diagnosis not present

## 2018-05-02 DIAGNOSIS — J3089 Other allergic rhinitis: Secondary | ICD-10-CM | POA: Diagnosis not present

## 2018-05-02 DIAGNOSIS — J301 Allergic rhinitis due to pollen: Secondary | ICD-10-CM | POA: Diagnosis not present

## 2018-05-10 ENCOUNTER — Other Ambulatory Visit: Payer: Self-pay | Admitting: Family Medicine

## 2018-05-10 DIAGNOSIS — Z1231 Encounter for screening mammogram for malignant neoplasm of breast: Secondary | ICD-10-CM

## 2018-05-11 ENCOUNTER — Ambulatory Visit
Admission: RE | Admit: 2018-05-11 | Discharge: 2018-05-11 | Disposition: A | Discharge status: Home / Self Care | Payer: BLUE CROSS/BLUE SHIELD | Source: Ambulatory Visit | Attending: Family Medicine | Admitting: Family Medicine

## 2018-05-11 DIAGNOSIS — Z1231 Encounter for screening mammogram for malignant neoplasm of breast: Secondary | ICD-10-CM

## 2018-05-11 DIAGNOSIS — J3089 Other allergic rhinitis: Secondary | ICD-10-CM | POA: Diagnosis not present

## 2018-05-11 DIAGNOSIS — J301 Allergic rhinitis due to pollen: Secondary | ICD-10-CM | POA: Diagnosis not present

## 2018-05-19 DIAGNOSIS — J301 Allergic rhinitis due to pollen: Secondary | ICD-10-CM | POA: Diagnosis not present

## 2018-05-19 DIAGNOSIS — J3089 Other allergic rhinitis: Secondary | ICD-10-CM | POA: Diagnosis not present

## 2018-05-26 DIAGNOSIS — J301 Allergic rhinitis due to pollen: Secondary | ICD-10-CM | POA: Diagnosis not present

## 2018-05-26 DIAGNOSIS — J3089 Other allergic rhinitis: Secondary | ICD-10-CM | POA: Diagnosis not present

## 2018-06-01 DIAGNOSIS — J301 Allergic rhinitis due to pollen: Secondary | ICD-10-CM | POA: Diagnosis not present

## 2018-06-01 DIAGNOSIS — J3089 Other allergic rhinitis: Secondary | ICD-10-CM | POA: Diagnosis not present

## 2018-06-08 DIAGNOSIS — J3089 Other allergic rhinitis: Secondary | ICD-10-CM | POA: Diagnosis not present

## 2018-06-08 DIAGNOSIS — J301 Allergic rhinitis due to pollen: Secondary | ICD-10-CM | POA: Diagnosis not present

## 2018-06-15 DIAGNOSIS — J3089 Other allergic rhinitis: Secondary | ICD-10-CM | POA: Diagnosis not present

## 2018-06-15 DIAGNOSIS — J301 Allergic rhinitis due to pollen: Secondary | ICD-10-CM | POA: Diagnosis not present

## 2018-06-21 DIAGNOSIS — J301 Allergic rhinitis due to pollen: Secondary | ICD-10-CM | POA: Diagnosis not present

## 2018-06-21 DIAGNOSIS — J3089 Other allergic rhinitis: Secondary | ICD-10-CM | POA: Diagnosis not present

## 2018-06-30 DIAGNOSIS — J301 Allergic rhinitis due to pollen: Secondary | ICD-10-CM | POA: Diagnosis not present

## 2018-06-30 DIAGNOSIS — J3089 Other allergic rhinitis: Secondary | ICD-10-CM | POA: Diagnosis not present

## 2018-07-05 DIAGNOSIS — J301 Allergic rhinitis due to pollen: Secondary | ICD-10-CM | POA: Diagnosis not present

## 2018-07-05 DIAGNOSIS — J3089 Other allergic rhinitis: Secondary | ICD-10-CM | POA: Diagnosis not present

## 2018-07-14 DIAGNOSIS — J3089 Other allergic rhinitis: Secondary | ICD-10-CM | POA: Diagnosis not present

## 2018-07-14 DIAGNOSIS — J301 Allergic rhinitis due to pollen: Secondary | ICD-10-CM | POA: Diagnosis not present

## 2018-07-22 DIAGNOSIS — J301 Allergic rhinitis due to pollen: Secondary | ICD-10-CM | POA: Diagnosis not present

## 2018-07-22 DIAGNOSIS — J3089 Other allergic rhinitis: Secondary | ICD-10-CM | POA: Diagnosis not present

## 2018-07-28 DIAGNOSIS — J301 Allergic rhinitis due to pollen: Secondary | ICD-10-CM | POA: Diagnosis not present

## 2018-07-28 DIAGNOSIS — J3089 Other allergic rhinitis: Secondary | ICD-10-CM | POA: Diagnosis not present

## 2018-08-04 DIAGNOSIS — J301 Allergic rhinitis due to pollen: Secondary | ICD-10-CM | POA: Diagnosis not present

## 2018-08-04 DIAGNOSIS — J3089 Other allergic rhinitis: Secondary | ICD-10-CM | POA: Diagnosis not present

## 2018-08-11 DIAGNOSIS — J301 Allergic rhinitis due to pollen: Secondary | ICD-10-CM | POA: Diagnosis not present

## 2018-08-11 DIAGNOSIS — J3089 Other allergic rhinitis: Secondary | ICD-10-CM | POA: Diagnosis not present

## 2018-08-26 DIAGNOSIS — J301 Allergic rhinitis due to pollen: Secondary | ICD-10-CM | POA: Diagnosis not present

## 2018-08-26 DIAGNOSIS — J3089 Other allergic rhinitis: Secondary | ICD-10-CM | POA: Diagnosis not present

## 2018-09-01 DIAGNOSIS — J3089 Other allergic rhinitis: Secondary | ICD-10-CM | POA: Diagnosis not present

## 2018-09-01 DIAGNOSIS — J301 Allergic rhinitis due to pollen: Secondary | ICD-10-CM | POA: Diagnosis not present

## 2018-09-15 DIAGNOSIS — J3089 Other allergic rhinitis: Secondary | ICD-10-CM | POA: Diagnosis not present

## 2018-09-15 DIAGNOSIS — J301 Allergic rhinitis due to pollen: Secondary | ICD-10-CM | POA: Diagnosis not present

## 2018-09-22 DIAGNOSIS — J301 Allergic rhinitis due to pollen: Secondary | ICD-10-CM | POA: Diagnosis not present

## 2018-09-22 DIAGNOSIS — J3089 Other allergic rhinitis: Secondary | ICD-10-CM | POA: Diagnosis not present

## 2018-10-04 DIAGNOSIS — J3081 Allergic rhinitis due to animal (cat) (dog) hair and dander: Secondary | ICD-10-CM | POA: Diagnosis not present

## 2018-10-04 DIAGNOSIS — J3089 Other allergic rhinitis: Secondary | ICD-10-CM | POA: Diagnosis not present

## 2018-10-04 DIAGNOSIS — J301 Allergic rhinitis due to pollen: Secondary | ICD-10-CM | POA: Diagnosis not present

## 2018-10-10 DIAGNOSIS — J3081 Allergic rhinitis due to animal (cat) (dog) hair and dander: Secondary | ICD-10-CM | POA: Diagnosis not present

## 2018-10-10 DIAGNOSIS — J301 Allergic rhinitis due to pollen: Secondary | ICD-10-CM | POA: Diagnosis not present

## 2018-10-10 DIAGNOSIS — J3089 Other allergic rhinitis: Secondary | ICD-10-CM | POA: Diagnosis not present

## 2018-10-10 DIAGNOSIS — R05 Cough: Secondary | ICD-10-CM | POA: Diagnosis not present

## 2018-10-18 DIAGNOSIS — N281 Cyst of kidney, acquired: Secondary | ICD-10-CM | POA: Diagnosis not present

## 2018-10-18 DIAGNOSIS — N2889 Other specified disorders of kidney and ureter: Secondary | ICD-10-CM | POA: Diagnosis not present

## 2018-10-18 DIAGNOSIS — Z89512 Acquired absence of left leg below knee: Secondary | ICD-10-CM | POA: Diagnosis not present

## 2018-10-20 DIAGNOSIS — J301 Allergic rhinitis due to pollen: Secondary | ICD-10-CM | POA: Diagnosis not present

## 2018-10-20 DIAGNOSIS — J3089 Other allergic rhinitis: Secondary | ICD-10-CM | POA: Diagnosis not present

## 2018-11-04 DIAGNOSIS — J301 Allergic rhinitis due to pollen: Secondary | ICD-10-CM | POA: Diagnosis not present

## 2018-11-04 DIAGNOSIS — J3089 Other allergic rhinitis: Secondary | ICD-10-CM | POA: Diagnosis not present

## 2018-11-17 DIAGNOSIS — J301 Allergic rhinitis due to pollen: Secondary | ICD-10-CM | POA: Diagnosis not present

## 2018-11-17 DIAGNOSIS — J3089 Other allergic rhinitis: Secondary | ICD-10-CM | POA: Diagnosis not present

## 2018-11-22 DIAGNOSIS — J301 Allergic rhinitis due to pollen: Secondary | ICD-10-CM | POA: Diagnosis not present

## 2018-11-22 DIAGNOSIS — J3089 Other allergic rhinitis: Secondary | ICD-10-CM | POA: Diagnosis not present

## 2018-12-07 DIAGNOSIS — N2889 Other specified disorders of kidney and ureter: Secondary | ICD-10-CM | POA: Diagnosis not present

## 2018-12-07 DIAGNOSIS — N281 Cyst of kidney, acquired: Secondary | ICD-10-CM | POA: Diagnosis not present

## 2018-12-15 DIAGNOSIS — J301 Allergic rhinitis due to pollen: Secondary | ICD-10-CM | POA: Diagnosis not present

## 2018-12-15 DIAGNOSIS — J3089 Other allergic rhinitis: Secondary | ICD-10-CM | POA: Diagnosis not present

## 2018-12-22 DIAGNOSIS — J301 Allergic rhinitis due to pollen: Secondary | ICD-10-CM | POA: Diagnosis not present

## 2018-12-22 DIAGNOSIS — J3089 Other allergic rhinitis: Secondary | ICD-10-CM | POA: Diagnosis not present

## 2018-12-23 DIAGNOSIS — J301 Allergic rhinitis due to pollen: Secondary | ICD-10-CM | POA: Diagnosis not present

## 2018-12-26 DIAGNOSIS — J3089 Other allergic rhinitis: Secondary | ICD-10-CM | POA: Diagnosis not present

## 2018-12-29 DIAGNOSIS — J3089 Other allergic rhinitis: Secondary | ICD-10-CM | POA: Diagnosis not present

## 2018-12-29 DIAGNOSIS — L820 Inflamed seborrheic keratosis: Secondary | ICD-10-CM | POA: Diagnosis not present

## 2018-12-29 DIAGNOSIS — J301 Allergic rhinitis due to pollen: Secondary | ICD-10-CM | POA: Diagnosis not present

## 2019-01-02 DIAGNOSIS — J3089 Other allergic rhinitis: Secondary | ICD-10-CM | POA: Diagnosis not present

## 2019-01-02 DIAGNOSIS — J301 Allergic rhinitis due to pollen: Secondary | ICD-10-CM | POA: Diagnosis not present

## 2019-01-03 DIAGNOSIS — N281 Cyst of kidney, acquired: Secondary | ICD-10-CM | POA: Diagnosis not present

## 2019-01-05 DIAGNOSIS — N281 Cyst of kidney, acquired: Secondary | ICD-10-CM | POA: Diagnosis not present

## 2019-01-06 DIAGNOSIS — J3089 Other allergic rhinitis: Secondary | ICD-10-CM | POA: Diagnosis not present

## 2019-01-06 DIAGNOSIS — J301 Allergic rhinitis due to pollen: Secondary | ICD-10-CM | POA: Diagnosis not present

## 2019-01-09 DIAGNOSIS — J301 Allergic rhinitis due to pollen: Secondary | ICD-10-CM | POA: Diagnosis not present

## 2019-01-09 DIAGNOSIS — J3089 Other allergic rhinitis: Secondary | ICD-10-CM | POA: Diagnosis not present

## 2019-01-13 DIAGNOSIS — J301 Allergic rhinitis due to pollen: Secondary | ICD-10-CM | POA: Diagnosis not present

## 2019-01-13 DIAGNOSIS — J3089 Other allergic rhinitis: Secondary | ICD-10-CM | POA: Diagnosis not present

## 2019-01-20 DIAGNOSIS — J301 Allergic rhinitis due to pollen: Secondary | ICD-10-CM | POA: Diagnosis not present

## 2019-01-20 DIAGNOSIS — J3089 Other allergic rhinitis: Secondary | ICD-10-CM | POA: Diagnosis not present

## 2019-01-23 DIAGNOSIS — J3089 Other allergic rhinitis: Secondary | ICD-10-CM | POA: Diagnosis not present

## 2019-01-23 DIAGNOSIS — J301 Allergic rhinitis due to pollen: Secondary | ICD-10-CM | POA: Diagnosis not present

## 2019-02-03 DIAGNOSIS — J301 Allergic rhinitis due to pollen: Secondary | ICD-10-CM | POA: Diagnosis not present

## 2019-02-03 DIAGNOSIS — J3089 Other allergic rhinitis: Secondary | ICD-10-CM | POA: Diagnosis not present

## 2019-02-08 DIAGNOSIS — N951 Menopausal and female climacteric states: Secondary | ICD-10-CM | POA: Diagnosis not present

## 2019-02-08 DIAGNOSIS — J301 Allergic rhinitis due to pollen: Secondary | ICD-10-CM | POA: Diagnosis not present

## 2019-02-08 DIAGNOSIS — J3089 Other allergic rhinitis: Secondary | ICD-10-CM | POA: Diagnosis not present

## 2019-02-08 DIAGNOSIS — G43919 Migraine, unspecified, intractable, without status migrainosus: Secondary | ICD-10-CM | POA: Diagnosis not present

## 2019-02-14 DIAGNOSIS — J3089 Other allergic rhinitis: Secondary | ICD-10-CM | POA: Diagnosis not present

## 2019-02-14 DIAGNOSIS — J301 Allergic rhinitis due to pollen: Secondary | ICD-10-CM | POA: Diagnosis not present

## 2019-02-20 DIAGNOSIS — J301 Allergic rhinitis due to pollen: Secondary | ICD-10-CM | POA: Diagnosis not present

## 2019-02-20 DIAGNOSIS — J3089 Other allergic rhinitis: Secondary | ICD-10-CM | POA: Diagnosis not present

## 2019-11-08 ENCOUNTER — Other Ambulatory Visit: Payer: Self-pay

## 2019-11-08 DIAGNOSIS — Z20822 Contact with and (suspected) exposure to covid-19: Secondary | ICD-10-CM

## 2019-11-09 LAB — SARS-COV-2, NAA 2 DAY TAT

## 2019-11-09 LAB — NOVEL CORONAVIRUS, NAA: SARS-CoV-2, NAA: NOT DETECTED

## 2021-04-01 ENCOUNTER — Other Ambulatory Visit: Payer: Self-pay | Admitting: Family Medicine

## 2021-04-01 DIAGNOSIS — Z1231 Encounter for screening mammogram for malignant neoplasm of breast: Secondary | ICD-10-CM

## 2021-04-02 ENCOUNTER — Ambulatory Visit
Admission: RE | Admit: 2021-04-02 | Discharge: 2021-04-02 | Disposition: A | Discharge status: Home / Self Care | Payer: 59 | Source: Ambulatory Visit | Attending: Family Medicine | Admitting: Family Medicine

## 2021-04-02 DIAGNOSIS — Z1231 Encounter for screening mammogram for malignant neoplasm of breast: Secondary | ICD-10-CM | POA: Diagnosis not present

## 2021-04-03 ENCOUNTER — Other Ambulatory Visit: Payer: Self-pay | Admitting: Family Medicine

## 2021-04-03 DIAGNOSIS — R928 Other abnormal and inconclusive findings on diagnostic imaging of breast: Secondary | ICD-10-CM

## 2021-04-29 DIAGNOSIS — R239 Unspecified skin changes: Secondary | ICD-10-CM | POA: Diagnosis not present

## 2021-04-29 DIAGNOSIS — N951 Menopausal and female climacteric states: Secondary | ICD-10-CM | POA: Diagnosis not present

## 2021-04-29 DIAGNOSIS — G43009 Migraine without aura, not intractable, without status migrainosus: Secondary | ICD-10-CM | POA: Diagnosis not present

## 2021-05-05 ENCOUNTER — Other Ambulatory Visit: Payer: 59

## 2021-05-06 ENCOUNTER — Other Ambulatory Visit: Payer: 59

## 2021-05-14 ENCOUNTER — Other Ambulatory Visit: Payer: 59

## 2021-05-19 ENCOUNTER — Other Ambulatory Visit: Payer: Self-pay | Admitting: Family Medicine

## 2021-05-19 ENCOUNTER — Ambulatory Visit
Admission: RE | Admit: 2021-05-19 | Discharge: 2021-05-19 | Disposition: A | Discharge status: Home / Self Care | Payer: 59 | Source: Ambulatory Visit | Attending: Family Medicine | Admitting: Family Medicine

## 2021-05-19 DIAGNOSIS — N6310 Unspecified lump in the right breast, unspecified quadrant: Secondary | ICD-10-CM

## 2021-05-19 DIAGNOSIS — R928 Other abnormal and inconclusive findings on diagnostic imaging of breast: Secondary | ICD-10-CM

## 2021-05-19 DIAGNOSIS — R922 Inconclusive mammogram: Secondary | ICD-10-CM | POA: Diagnosis not present

## 2021-05-29 ENCOUNTER — Ambulatory Visit
Admission: RE | Admit: 2021-05-29 | Discharge: 2021-05-29 | Disposition: A | Discharge status: Home / Self Care | Payer: 59 | Source: Ambulatory Visit | Attending: Family Medicine | Admitting: Family Medicine

## 2021-05-29 DIAGNOSIS — C50919 Malignant neoplasm of unspecified site of unspecified female breast: Secondary | ICD-10-CM

## 2021-05-29 DIAGNOSIS — N6310 Unspecified lump in the right breast, unspecified quadrant: Secondary | ICD-10-CM

## 2021-05-29 DIAGNOSIS — C50311 Malignant neoplasm of lower-inner quadrant of right female breast: Secondary | ICD-10-CM | POA: Diagnosis not present

## 2021-05-29 DIAGNOSIS — N6314 Unspecified lump in the right breast, lower inner quadrant: Secondary | ICD-10-CM | POA: Diagnosis not present

## 2021-05-29 HISTORY — DX: Malignant neoplasm of unspecified site of unspecified female breast: C50.919

## 2021-06-04 DIAGNOSIS — C50311 Malignant neoplasm of lower-inner quadrant of right female breast: Secondary | ICD-10-CM | POA: Diagnosis not present

## 2021-06-04 DIAGNOSIS — Z170 Estrogen receptor positive status [ER+]: Secondary | ICD-10-CM | POA: Diagnosis not present

## 2021-06-05 ENCOUNTER — Encounter: Payer: Self-pay | Admitting: *Deleted

## 2021-06-05 ENCOUNTER — Telehealth: Payer: Self-pay | Admitting: Hematology and Oncology

## 2021-06-05 ENCOUNTER — Telehealth: Payer: Self-pay | Admitting: Genetic Counselor

## 2021-06-05 NOTE — Telephone Encounter (Signed)
Scheduled appt per 3/16 referral. Pt is aware of appt date and time. Pt is aware to arrive 15 mins prior to appt time and to bring and updated insurance card. Pt is aware of appt location.   ?

## 2021-06-09 NOTE — Progress Notes (Signed)
New Breast Cancer Diagnosis: Right Breast LIQ ? ?Did patient present with symptoms (if so, please note symptoms) or screening mammography?:Screening Mass.  Denies changes to her breast.  ? ?Location and Extent of disease :right breast. Located in the lower inner quadrant, It could not be visualized by ultrasound and there was essentially no correlate there however her right axilla was negative for adenopathy. ? 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 ?Receptor Status: ER(positive), PR (positive), Her2-neu (negative), Ki-(<5%) ? ? ?Surgeon and surgical plan, if any:  ?Dr. Marlou Starks 06/04/2021 ?-I will refer the patient to medical and radiation oncology as well as to genetics and plastic surgery. ?-She will notify me once she has made her final decision. ? ? ?Medical oncologist, treatment if any:   ?Dr. Lindi Adie 06/17/2021 ? ? ?Family History of Breast/Ovarian/Prostate Cancer: Dad had breast ? ?Lymphedema issues, if any: No    ? ?Pain issues, if any: No    ? ?SAFETY ISSUES: ?Prior radiation? Right lower leg- [facility]. ?Pacemaker/ICD? No ?Possible current pregnancy? Hysterectomy ?Is the patient on methotrexate? No ? ?Current Complaints / other details:   ?Genetics: 06/12/2021 ?

## 2021-06-09 NOTE — Progress Notes (Signed)
?Radiation Oncology         [phone] ?________________________________ ? ?Name: Michelle Contreras        MRN: [id number]  ?Date of Service: 06/10/2021 DOB: 09-09-1960 ? ?PJ:KDTOIZ, Baldemar Friday., PA-C  Jovita Kussmaul, MD    ? ?REFERRING PHYSICIAN: Jovita Kussmaul, MD ? ? ?DIAGNOSIS: The encounter diagnosis was Malignant neoplasm of lower-inner quadrant of right breast of female, estrogen receptor positive ([place]). ? ? ?HISTORY OF PRESENT ILLNESS: Michelle Contreras is a 61 y.o. female seen in the multidisciplinary breast clinic for a new diagnosis of right breast cancer.  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 She developed a recurrence requiring BKA. She has also had [place] in the 1990s of the right breast s/p lumpectomy. Recently however, the patient was noted to have screening detected right breast abnormality and on further diagnostic work-up an irregular shaped mass in the lower inner quadrant was noted.  It could not be visualized by ultrasound and there was essentially no correlate there however her right axilla was negative for adenopathy.  She underwent a stereotactic biopsy on 05/29/2021 which showed an invasive, well-differentiated ductal carcinoma grade 1 with associated DCIS that was felt to be intermediate grade with solid and cribriform types but no necrosis.  Her cancer was ER/PR positive, HER2 negative with a Ki-67 of less than 5%.  Her case was discussed in multidisciplinary breast oncology conference and plans are to proceed with breast conservation surgery.  She is seen to discuss adjuvant radiotherapy. ? ? ? ?PREVIOUS RADIATION THERAPY: Yes  ? ?1998: The Right lower leg [facility]. Details on dosing unavailable in [facility] system. ? ? ?PAST MEDICAL HISTORY:  ?Past Medical History:  ?Diagnosis Date  ? Allergy   ? Arthritis   ? neck  ? DVT (deep venous thrombosis) ([place])   ? upper thight - right leg  ?  Headache   ? migraines  ? HSV (herpes simplex virus) anogenital infection   ? Hx of  ? PONV (postoperative nausea and vomiting)   ? only with neck surgery.  No problems with any other surgery.  ? Sarcoma ([place])   ? Lower Extremitiy right leg  ?   ? ? ?PAST SURGICAL HISTORY: ?Past Surgical History:  ?Procedure Laterality Date  ? ABDOMINAL HYSTERECTOMY    ? amputation of lower limb Right 2001  ? right leg, below knee  ? CHOLECYSTECTOMY  09/07/2011  ? Procedure: LAPAROSCOPIC CHOLECYSTECTOMY WITH INTRAOPERATIVE CHOLANGIOGRAM;  Surgeon: Haywood Lasso, MD;  Location: [facility];  Service: General;  Laterality: N/A;  ? COLONOSCOPY  08/2010  ? polyps  ? LEG SURGERY Right 2017  ? LUMBAR LAMINECTOMY Left 07/17/2015  ? Procedure: MICRODISCECTOMY LUMBAR LAMINECTOMY  L4-L5 ON LEFT, CENTRAL DECOMPRESSION L4-L5 FOR SPINAL STENOSIS, FORAMINOTOMY L4-L5;  Surgeon: Latanya Maudlin, MD;  Location: [facility];  Service: Orthopedics;  Laterality: Left;  ? NECK SURGERY    ? fusion w/ plate  ? removal of benign growth to r breast    ? ? ? ?FAMILY HISTORY:  ?Family History  ?Problem Relation Age of Onset  ? Breast cancer Father   ? Colon cancer Father   ? Colon polyps Father   ? Heart disease Father   ?     ?  ? Cancer Father   ?     breast and colon and kidney  ? Diabetes Mother   ? Kidney  disease Mother   ?     currently on dialysis  ? Lung cancer Mother   ?     lung  ? Kidney cancer Paternal Aunt   ? Diabetes Maternal Grandfather   ? Colon polyps Brother   ? Stomach cancer Neg Hx   ? Rectal cancer Neg Hx   ? ? ? ?SOCIAL HISTORY:  reports that she is a non-smoker but has been exposed to tobacco smoke. She has never used smokeless tobacco. She reports current alcohol use of about 1.0 standard drink per week. She reports that she does not use drugs.  The patient is married and lives in [place].  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 She's accompanied by her sister.  ? ? ?ALLERGIES: Adhesive [tape], Codeine,  Gabapentin, Tizanidine, Tramadol, and Other ? ? ?MEDICATIONS:  ?Current Outpatient Medications  ?Medication Sig Dispense Refill  ? albuterol (PROVENTIL HFA;VENTOLIN HFA) 108 (90 Base) MCG/ACT inhaler Inhale into the lungs.    ? b complex vitamins capsule Take 1 capsule by mouth daily.      ? Cholecalciferol (VITAMIN D3) 10000 units TABS Take 1,000 Units by mouth daily.    ? estradiol (ESTRACE) 1 MG tablet Take 0.5 mg by mouth daily.    ? ibuprofen (ADVIL,MOTRIN) 200 MG tablet Take 800 mg by mouth every 6 (six) hours as needed. For pain    ? levocetirizine (XYZAL) 5 MG tablet Take 2.5 mg by mouth every evening.    ? lidocaine (LIDODERM) 5 % Place 1 patch onto the skin daily as needed (For pain.).     ? Multiple Vitamin (MULTIVITAMIN WITH MINERALS) TABS tablet Take 1 tablet by mouth daily.    ? SUMAtriptan (IMITREX) 100 MG tablet Take 100 mg by mouth every 2 (two) hours as needed for migraine or headache.     ? tretinoin (RETIN-A) 0.05 % cream Apply 1 application topically at bedtime. Apply sparingly.    ? UNABLE TO FIND Med Name: Patient takes allergy shots weekly. 1-2 injections per week.  Unsure of name of medication    ? ?No current facility-administered medications for this visit.  ? ? ? ?REVIEW OF SYSTEMS: On review of systems, the patient reports that she is doing well overall but trying to navigate her surgical options. She would like to meet with plastic surgery to hear her options if she considers mastectomy but would also be interested in mastopexy on the left due to prior breast asymmetry from previous lumpectomy. No other complaints are verbalized.  ? ?  ? ?PHYSICAL EXAM:  ?Wt Readings from Last 3 Encounters:  ?04/05/18 186 lb (84.4 kg)  ?03/29/18 186 lb (84.4 kg)  ?11/01/17 188 lb 6.4 oz (85.5 kg)  ? ?Temp Readings from Last 3 Encounters:  ?04/05/18 97.8 ?F (36.6 ?C)  ?07/18/15 98.9 ?F (37.2 ?C) (Oral)  ?07/11/15 98.8 ?F (37.1 ?C) (Oral)  ? ?BP Readings from Last 3 Encounters:  ?04/05/18 (!) 101/55   ?11/01/17 118/82  ?10/13/17 116/76  ? ?Pulse Readings from Last 3 Encounters:  ?04/05/18 63  ?11/01/17 75  ?10/13/17 74  ? ? ?In general this is a well appearing Caucasian female in no acute distress. She's alert and oriented x4 and appropriate throughout the examination. Cardiopulmonary assessment is negative for acute distress and she exhibits normal effort. Bilateral breast exam is deferred. ? ? ? ?ECOG = 0 ? ?0 - Asymptomatic (Fully active, able to carry on all predisease activities without restriction) ? ?1 -  Symptomatic but completely ambulatory (Restricted in physically strenuous activity but ambulatory and able to carry out work of a light or sedentary nature. For example, light housework, office work) ? ?2 - Symptomatic, <50% in bed during the day (Ambulatory and capable of all self care but unable to carry out any work activities. Up and about more than 50% of waking hours) ? ?3 - Symptomatic, >50% in bed, but not bedbound (Capable of only limited self-care, confined to bed or chair 50% or more of waking hours) ? ?4 - Bedbound (Completely disabled. Cannot carry on any self-care. Totally confined to bed or chair) ? ?5 - Death ? ? Oken MM, Creech RH, Tormey DC, et al. [phone]). "Toxicity and response criteria of the [facility]". [place] Oncol. 5 (6): 649-55 ? ? ? ?LABORATORY DATA:  ?Lab Results  ?Component Value Date  ? WBC 7.4 06/25/2015  ? HGB 13.9 06/25/2015  ? HCT 40.1 06/25/2015  ? MCV 87.0 06/25/2015  ? PLT 199 06/25/2015  ? ?Lab Results  ?Component Value Date  ? NA 133 (L) 06/25/2015  ? K 4.0 06/25/2015  ? CL 98 (L) 06/25/2015  ? CO2 27 06/25/2015  ? ?Lab Results  ?Component Value Date  ? ALT 23 06/25/2015  ? AST 24 06/25/2015  ? ALKPHOS 47 06/25/2015  ? BILITOT 0.8 06/25/2015  ? ?  ? ?RADIOGRAPHY: US BREAST LTD UNI RIGHT INC AXILLA ? ?Result Date: 05/19/2021 ?CLINICAL DATA:  Patient returns today to evaluate a RIGHT breast mass identified on recent screening mammogram.  EXAM: DIGITAL DIAGNOSTIC UNILATERAL RIGHT MAMMOGRAM WITH TOMOSYNTHESIS AND CAD; ULTRASOUND RIGHT BREAST LIMITED TECHNIQUE: Right digital diagnostic mammography and breast tomosynthesis was performed. The images were evalu

## 2021-06-10 ENCOUNTER — Other Ambulatory Visit: Payer: Self-pay

## 2021-06-10 ENCOUNTER — Inpatient Hospital Stay: Payer: 59

## 2021-06-10 ENCOUNTER — Encounter: Payer: Self-pay | Admitting: Radiation Oncology

## 2021-06-10 ENCOUNTER — Other Ambulatory Visit: Payer: Self-pay | Admitting: Genetic Counselor

## 2021-06-10 ENCOUNTER — Ambulatory Visit
Admission: RE | Admit: 2021-06-10 | Discharge: 2021-06-10 | Disposition: A | Discharge status: Home / Self Care | Payer: 59 | Source: Ambulatory Visit | Attending: Radiation Oncology | Admitting: Radiation Oncology

## 2021-06-10 ENCOUNTER — Inpatient Hospital Stay: Payer: 59 | Attending: Radiation Oncology | Admitting: Genetic Counselor

## 2021-06-10 ENCOUNTER — Encounter: Payer: Self-pay | Admitting: Genetic Counselor

## 2021-06-10 VITALS — BP 130/59 | HR 71 | Temp 97.8°F | Resp 18 | Ht 69.5 in | Wt 193.2 lb

## 2021-06-10 DIAGNOSIS — Z808 Family history of malignant neoplasm of other organs or systems: Secondary | ICD-10-CM | POA: Insufficient documentation

## 2021-06-10 DIAGNOSIS — Z801 Family history of malignant neoplasm of trachea, bronchus and lung: Secondary | ICD-10-CM | POA: Insufficient documentation

## 2021-06-10 DIAGNOSIS — Z853 Personal history of malignant neoplasm of breast: Secondary | ICD-10-CM | POA: Diagnosis not present

## 2021-06-10 DIAGNOSIS — Z803 Family history of malignant neoplasm of breast: Secondary | ICD-10-CM | POA: Insufficient documentation

## 2021-06-10 DIAGNOSIS — Z800 Family history of malignant neoplasm of digestive organs: Secondary | ICD-10-CM | POA: Insufficient documentation

## 2021-06-10 DIAGNOSIS — Z8051 Family history of malignant neoplasm of kidney: Secondary | ICD-10-CM | POA: Insufficient documentation

## 2021-06-10 DIAGNOSIS — Z79899 Other long term (current) drug therapy: Secondary | ICD-10-CM | POA: Insufficient documentation

## 2021-06-10 DIAGNOSIS — Z86718 Personal history of other venous thrombosis and embolism: Secondary | ICD-10-CM | POA: Insufficient documentation

## 2021-06-10 DIAGNOSIS — Z170 Estrogen receptor positive status [ER+]: Secondary | ICD-10-CM | POA: Insufficient documentation

## 2021-06-10 DIAGNOSIS — C50311 Malignant neoplasm of lower-inner quadrant of right female breast: Secondary | ICD-10-CM | POA: Diagnosis not present

## 2021-06-10 DIAGNOSIS — Z1379 Encounter for other screening for genetic and chromosomal anomalies: Secondary | ICD-10-CM

## 2021-06-10 LAB — GENETIC SCREENING ORDER

## 2021-06-10 NOTE — Progress Notes (Signed)
REFERRING PROVIDER: ?Jovita Kussmaul, MD ?[address] ?[place], [place] [address] ? ?PRIMARY PROVIDER:  ?Aletha Halim., PA-C ? ?PRIMARY REASON FOR VISIT:  ?1. Malignant neoplasm of lower-inner quadrant of right breast of female, estrogen receptor positive ([place])   ?2. Family history of breast cancer in female   ?3. Family history of kidney cancer   ? ? ?HISTORY OF PRESENT ILLNESS:   ?Ms. Salatino, a 61 y.o. female, was seen for a [facility] cancer genetics consultation at the request of Dr. Marlou Starks due to a personal and family history of cancer.  Ms. Dorval presents to clinic today to discuss the possibility of a hereditary predisposition to cancer, to discuss genetic testing, and to further clarify her future cancer risks, as well as potential cancer risks for family members.  ? ?In March 2023, at the age of 41, Ms. Steedman was diagnosed with invasive ductal carcinoma of the right breast.  The treatment plan is pending.  ? ?CANCER HISTORY:  ?Ms. Haston reports she was diagnosed with a sarcoma of her right leg at age 40 and it later recurred at age 19 which required amputation. She was recently diagnosed with IDC of the right breast at age 81. ? ?RISK FACTORS:  ?First live birth at age 89  ?OCP use for approximately  15  years.  ?Ovaries intact: no, removed at age 81 ?Uterus intact: no, removed at age 63 ?Menopausal status: postmenopausal.  ?HRT use: 10 years. ?Colonoscopy: yes; 1 sessile serrated polyp on 2020 colonoscopy ?Mammogram within the last year: yes ? ?Past Medical History:  ?Diagnosis Date  ? Allergy   ? Arthritis   ? neck  ? Breast cancer ([place]) 05/29/2021  ? DVT (deep venous thrombosis) ([place])   ? upper thight - right leg  ? Headache   ? migraines  ? HSV (herpes simplex virus) anogenital infection   ? Hx of  ? PONV (postoperative nausea and vomiting)   ? only with neck surgery.  No problems with any other surgery.  ? Sarcoma ([place])   ? Lower Extremitiy right leg  ? ? ?Past Surgical History:   ?Procedure Laterality Date  ? ABDOMINAL HYSTERECTOMY    ? amputation of lower limb Right 2001  ? right leg, below knee  ? CHOLECYSTECTOMY  09/07/2011  ? Procedure: LAPAROSCOPIC CHOLECYSTECTOMY WITH INTRAOPERATIVE CHOLANGIOGRAM;  Surgeon: Haywood Lasso, MD;  Location: [facility];  Service: General;  Laterality: N/A;  ? COLONOSCOPY  08/2010  ? polyps  ? LEG SURGERY Right 2017  ? LUMBAR LAMINECTOMY Left 07/17/2015  ? Procedure: MICRODISCECTOMY LUMBAR LAMINECTOMY  L4-L5 ON LEFT, CENTRAL DECOMPRESSION L4-L5 FOR SPINAL STENOSIS, FORAMINOTOMY L4-L5;  Surgeon: Latanya Maudlin, MD;  [facility];  Service: Orthopedics;  Laterality: Left;  ? NECK SURGERY    ? fusion w/ plate  ? removal of benign growth to r breast    ? ? ?Social History  ? ?Socioeconomic History  ? Marital status: Married  ?  Spouse name: Not on file  ? Number of children: Not on file  ? Years of education: Not on file  ? Highest education level: Not on file  ?Occupational History  ?  Employer: [employer]  ?Tobacco Use  ? Smoking status: Never  ?  Passive exposure: Yes  ? Smokeless tobacco: Never  ?Vaping Use  ? Vaping Use: Never used  ?Substance and Sexual Activity  ? Alcohol use: Yes  ?  Alcohol/week: 1.0 standard drink  ?  Types: 1  Standard drinks or equivalent per week  ?  Comment: occasional-weekends  ? Drug use: No  ? Sexual activity: Yes  ?  Birth control/protection: None  ?  Comment: Hysterectomy  ?Other Topics Concern  ? Not on file  ?Social History Narrative  ? Not on file  ? ?Social Determinants of Health  ? ?Financial Resource Strain: Not on file  ?Food Insecurity: Not on file  ?Transportation Needs: Not on file  ?Physical Activity: Not on file  ?Stress: Not on file  ?Social Connections: Not on file  ?  ? ?FAMILY HISTORY:  ?We obtained a detailed, 4-generation family history.  Significant diagnoses are listed below: ?Family History  ?Problem Relation Age of Onset  ? Lung cancer Mother 9  ?     lung  ? Kidney cancer Father 23  ? Breast  cancer Father 30  ? Colon polyps Father   ?     >10 precancerous colon polyps  ? Colon polyps Brother   ? Throat cancer Brother   ? Colon cancer Other   ? ? ? ? ?Ms. Quiros's brother was diagnosed with throat cancer at age 18. Her mother was diagnosed with lung cancer at age 82, she died at age 109. Ms. Crunkleton's father was diagnosed with breast cancer at age 4 and kidney cancer at age 60, he had a history of numerous precancerous colon polyps (>10), he died due to metastatic kidney cancer at age 59. She has two paternal great uncles who had a history of colon cancer, they are deceased. Ms. Hobart is unaware of previous family history of genetic testing for hereditary cancer risks. There is no reported Ashkenazi Jewish ancestry.  ? ?GENETIC COUNSELING ASSESSMENT: Ms. Colantuono is a 61 y.o. female with a personal and family history of cancer which is somewhat suggestive of a hereditary cancer syndrome and predisposition to cancer. We, therefore, discussed and recommended the following at today's visit.  ? ?DISCUSSION: We discussed that 5 - 10% of cancer is hereditary, with most cases of hereditary breast cancer associated with mutations in BRCA1/2.  There are other genes that can be associated with hereditary breast cancer syndromes. Type of cancer risk and level of risk are gene-specific. We discussed that testing is beneficial for several reasons including knowing how to follow individuals after completing their treatment, identifying whether potential treatment options would be beneficial, and understanding if other family members could be at risk for cancer and allowing them to undergo genetic testing.  ? ?We reviewed the characteristics, features and inheritance patterns of hereditary cancer syndromes. We also discussed genetic testing, including the appropriate family members to test, the process of testing, insurance coverage and turn-around-time for results. We discussed the implications of a negative, positive  and/or variant of uncertain significant result. In order to get genetic test results in a timely manner so that Ms. Brubacher can use these genetic test results for surgical decisions, we recommended Ms. Kochanski pursue genetic testing for the BRCAplus. Once complete, we recommend Ms. Laux pursue reflex genetic testing to a more comprehensive gene panel.  ? ?Ms. Alcaraz was offered a common hereditary cancer panel (47 genes) and an expanded pan-cancer panel (77 genes). Ms. Corea was informed of the benefits and limitations of each panel, including that expanded pan-cancer panels contain genes that do not have clear management guidelines at this point in time.  We also discussed that as the number of genes included on a panel increases, the chances of variants of uncertain significance increases.  After considering the benefits and limitations of each gene panel, Ms. Clingenpeel elected to have [facility] CancerNext-Expanded Panel. ? ?The CancerNext-Expanded gene panel offered by [facility] and includes sequencing, rearrangement, and RNA analysis for the following 77 genes: AIP, ALK, APC, ATM, AXIN2, BAP1, BARD1, BLM, BMPR1A, BRCA1, BRCA2, BRIP1, CDC73, CDH1, CDK4, CDKN1B, CDKN2A, CHEK2, CTNNA1, DICER1, FANCC, FH, FLCN, GALNT12, KIF1B, LZTR1, MAX, MEN1, MET, MLH1, MSH2, MSH3, MSH6, MUTYH, NBN, NF1, NF2, NTHL1, PALB2, PHOX2B, PMS2, POT1, PRKAR1A, PTCH1, PTEN, RAD51C, RAD51D, RB1, RECQL, RET, SDHA, SDHAF2, SDHB, SDHC, SDHD, SMAD4, SMARCA4, SMARCB1, SMARCE1, STK11, SUFU, TMEM127, TP53, TSC1, TSC2, VHL and XRCC2 (sequencing and deletion/duplication); EGFR, EGLN1, HOXB13, KIT, MITF, PDGFRA, POLD1, and POLE (sequencing only); EPCAM and GREM1 (deletion/duplication only).  ? ?Based on Ms. Leu's personal and family history of cancer, she meets medical criteria for genetic testing. Despite that she meets criteria, she may still have an out of pocket cost. We discussed that if her out of pocket cost for testing is over $100, the  laboratory should contact them to discuss self-pay prices, patient pay assistance programs, if applicable, and other billing options.  ? ?PLAN: After considering the risks, benefits, and limitations, Ms. Mcdill pr

## 2021-06-10 NOTE — Addendum Note (Signed)
Encounter addended by: Cori Razor, RN on: 06/10/2021 11:18 AM ? Actions taken: Flowsheet accepted

## 2021-06-11 ENCOUNTER — Telehealth: Payer: Self-pay | Admitting: *Deleted

## 2021-06-11 NOTE — Telephone Encounter (Signed)
Called patient to inform of appt. with Dr. Iran Planas on 07-02-21 - arrival time- 1:50 pm- address - [address], phone number [phone], spoke with patient and she is aware of this appt. ?

## 2021-06-12 ENCOUNTER — Encounter: Payer: 59 | Admitting: Genetic Counselor

## 2021-06-12 ENCOUNTER — Other Ambulatory Visit: Payer: 59

## 2021-06-16 NOTE — Progress Notes (Signed)
[facility] ?CONSULT NOTE ? ?Patient Care Team: ?Amie Critchley as PCP - General (Physician Assistant) ? ?CHIEF COMPLAINTS/PURPOSE OF CONSULTATION:  ?Newly diagnosed breast cancer Right breast cancer ? ?HISTORY OF PRESENTING ILLNESS:  ?Michelle Contreras 61 y.o. female is here because of recent diagnosis of right breast cancer. She presents to the clinic today for discussion and a consult.  She had a routine screening mammogram that detected abnormality in the right breast.  This led to ultrasounds and additional mammograms.  It was not visible by ultrasound.  Therefore she underwent a stereotactic biopsy of the right breast which revealed invasive ductal carcinoma grade 1 that was ER/PR positive HER2 negative with a Ki-67 less than 5%.  She has seen Dr. Marlou Starks with surgery and was debating the pros and cons of doing lumpectomy versus bilateral mastectomies.   She had genetic counseling and testing which did not reveal any abnormal gene mutations. ?She has a prior history of malignant fibrous histiocytoma of the leg for which she underwent resection and radiation but then 3 years later it relapsed with bone involvement and therefore she required amputation of the leg. ? ?I reviewed her records extensively and collaborated the history with the patient. ? ?SUMMARY OF ONCOLOGIC HISTORY: ?Oncology History  ?Malignant neoplasm of lower-inner quadrant of right breast of female, estrogen receptor positive ([place])  ?05/29/2021 Initial Diagnosis  ? Screening mammogram detected right breast abnormality.  No ultrasound correlate.  Stereotactic biopsy revealed well-differentiated ductal adenocarcinoma with extracellular mucin grade 1, intermediate grade DCIS, ER 95%, PR 95%, Ki-67 less than 5%, HER2 1+ ?  ?06/17/2021 Cancer Staging  ? Staging form: Breast, AJCC 8th Edition ?- Clinical: Stage Unknown (cTX, cN0, cM0, G1, ER+, PR+, HER2-) - Signed by Nicholas Lose, MD on 06/17/2021 ?Stage prefix: Initial  diagnosis ?Histologic grading system: 3 grade system ? ?  ? Genetic Testing  ? [facility] BRCAplus was Negative. Report date is 06/16/2021. ? ?The BRCAplus panel offered by [employer] and includes sequencing and deletion/duplication analysis for the following 8 genes: ATM, BRCA1, BRCA2, CDH1, CHEK2, PALB2, PTEN, and TP53. ?  ? ? ? ?MEDICAL HISTORY:  ?Past Medical History:  ?Diagnosis Date  ? Allergy   ? Arthritis   ? neck  ? Breast cancer ([place]) 05/29/2021  ? DVT (deep venous thrombosis) ([place])   ? upper thight - right leg  ? Headache   ? migraines  ? HSV (herpes simplex virus) anogenital infection   ? Hx of  ? PONV (postoperative nausea and vomiting)   ? only with neck surgery.  No problems with any other surgery.  ? Sarcoma ([place])   ? Lower Extremitiy right leg  ? ? ?SURGICAL HISTORY: ?Past Surgical History:  ?Procedure Laterality Date  ? ABDOMINAL HYSTERECTOMY    ? amputation of lower limb Right 2001  ? right leg, below knee  ? CHOLECYSTECTOMY  09/07/2011  ? Procedure: LAPAROSCOPIC CHOLECYSTECTOMY WITH INTRAOPERATIVE CHOLANGIOGRAM;  Surgeon: Haywood Lasso, MD;  Location: [facility];  Service: General;  Laterality: N/A;  ? COLONOSCOPY  08/2010  ? polyps  ? LEG SURGERY Right 2017  ? LUMBAR LAMINECTOMY Left 07/17/2015  ? Procedure: MICRODISCECTOMY LUMBAR LAMINECTOMY  L4-L5 ON LEFT, CENTRAL DECOMPRESSION L4-L5 FOR SPINAL STENOSIS, FORAMINOTOMY L4-L5;  Surgeon: Latanya Maudlin, MD;  Location: [facility];  Service: Orthopedics;  Laterality: Left;  ? NECK SURGERY    ? fusion w/ plate  ? removal of benign growth to r breast    ? ? ?SOCIAL  HISTORY: ?Social History  ? ?Socioeconomic History  ? Marital status: Married  ?  Spouse name: Not on file  ? Number of children: Not on file  ? Years of education: Not on file  ? Highest education level: Not on file  ?Occupational History  ?  Employer: [employer]  ?Tobacco Use  ? Smoking status: Never  ?  Passive exposure: Yes  ? Smokeless tobacco: Never  ?Vaping Use  ? Vaping Use: Never  used  ?Substance and Sexual Activity  ? Alcohol use: Yes  ?  Alcohol/week: 1.0 standard drink  ?  Types: 1 Standard drinks or equivalent per week  ?  Comment: occasional-weekends  ? Drug use: No  ? Sexual activity: Yes  ?  Birth control/protection: None  ?  Comment: Hysterectomy  ?Other Topics Concern  ? Not on file  ?Social History Narrative  ? Not on file  ? ?Social Determinants of Health  ? ?Financial Resource Strain: Not on file  ?Food Insecurity: Not on file  ?Transportation Needs: Not on file  ?Physical Activity: Not on file  ?Stress: Not on file  ?Social Connections: Not on file  ?Intimate Partner Violence: Not on file  ? ? ?FAMILY HISTORY: ?Family History  ?Problem Relation Age of Onset  ? Diabetes Mother   ? Kidney disease Mother   ?     dialysis  ? Lung cancer Mother 41  ?     lung  ? Kidney cancer Father 30  ? Breast cancer Father 38  ? Colon polyps Father   ?     >10 precancerous colon polyps  ? Heart disease Father   ?     ?  ? Other Sister   ?     Skin syndrome  ? Colon polyps Brother   ? Throat cancer Brother   ? Other Paternal Aunt   ?     Amyloidosis  ? Diabetes Maternal Grandfather   ? Colon cancer Other   ? Stomach cancer Neg Hx   ? Rectal cancer Neg Hx   ? ? ?ALLERGIES:  is allergic to adhesive [tape], codeine, gabapentin, tizanidine, tramadol, wound dressing adhesive, and other. ? ?MEDICATIONS:  ?Current Outpatient Medications  ?Medication Sig Dispense Refill  ? b complex vitamins capsule Take 1 capsule by mouth daily.      ? cetirizine (ZYRTEC) 10 MG tablet Take by mouth.    ? Cholecalciferol (VITAMIN D3) 10000 units TABS Take 1,000 Units by mouth daily.    ? cyanocobalamin (,VITAMIN B-12,) 1000 MCG/ML injection Inject into the muscle.    ? estradiol (ESTRACE) 1 MG tablet Take 0.5 mg by mouth daily.    ? ibuprofen (ADVIL,MOTRIN) 200 MG tablet Take 800 mg by mouth every 6 (six) hours as needed. For pain    ? levocetirizine (XYZAL) 5 MG tablet Take 2.5 mg by mouth every evening.    ? lidocaine  (LIDODERM) 5 % Place 1 patch onto the skin daily as needed (For pain.).     ? Multiple Vitamin (MULTIVITAMIN WITH MINERALS) TABS tablet Take 1 tablet by mouth daily.    ? SUMAtriptan (IMITREX) 100 MG tablet Take 100 mg by mouth every 2 (two) hours as needed for migraine or headache.     ? tretinoin (RETIN-A) 0.05 % cream Apply 1 application topically at bedtime. Apply sparingly.    ? ?No current facility-administered medications for this visit.  ? ? ?REVIEW OF SYSTEMS:   ?Constitutional: Denies fevers, chills or abnormal night sweats ?  ?  All other systems were reviewed with the patient and are negative. ? ?PHYSICAL EXAMINATION: ?ECOG PERFORMANCE STATUS: 1 - Symptomatic but completely ambulatory ? ?Vitals:  ? 06/17/21 1531  ?BP: 133/76  ?Pulse: 87  ?Resp: 18  ?Temp: (!) 97.5 ?F (36.4 ?C)  ?SpO2: 100%  ? ?Filed Weights  ? 06/17/21 1531  ?Weight: 196 lb (88.9 kg)  ? ?  ? ?LABORATORY DATA:  ?I have reviewed the data as listed ?Lab Results  ?Component Value Date  ? WBC 7.4 06/25/2015  ? HGB 13.9 06/25/2015  ? HCT 40.1 06/25/2015  ? MCV 87.0 06/25/2015  ? PLT 199 06/25/2015  ? ?Lab Results  ?Component Value Date  ? NA 133 (L) 06/25/2015  ? K 4.0 06/25/2015  ? CL 98 (L) 06/25/2015  ? CO2 27 06/25/2015  ? ? ?RADIOGRAPHIC STUDIES: ?I have personally reviewed the radiological reports and agreed with the findings in the report. ? ?ASSESSMENT AND PLAN:  ?Malignant neoplasm of lower-inner quadrant of right breast of female, estrogen receptor positive ([place]) ?05/29/2021:Screening mammogram detected right breast abnormality.  No ultrasound correlate.  Stereotactic biopsy revealed well-differentiated ductal adenocarcinoma with extracellular mucin grade 1, intermediate grade DCIS, ER 95%, PR 95%, Ki-67 less than 5%, HER2 1+ ? ?Pathology and radiology counseling:Discussed with the patient, the details of pathology including the type of breast cancer,the clinical staging, the significance of ER, PR and HER-2/neu receptors and the  implications for treatment. After reviewing the pathology in detail, we proceeded to discuss the different treatment options between surgery, radiation, chemotherapy, antiestrogen therapies. ? ?Recommendations: ?1. Breast

## 2021-06-17 ENCOUNTER — Encounter: Payer: Self-pay | Admitting: Genetic Counselor

## 2021-06-17 ENCOUNTER — Telehealth: Payer: Self-pay | Admitting: Genetic Counselor

## 2021-06-17 ENCOUNTER — Inpatient Hospital Stay (HOSPITAL_BASED_OUTPATIENT_CLINIC_OR_DEPARTMENT_OTHER): Payer: 59 | Admitting: Hematology and Oncology

## 2021-06-17 ENCOUNTER — Other Ambulatory Visit: Payer: Self-pay

## 2021-06-17 ENCOUNTER — Encounter: Payer: Self-pay | Admitting: *Deleted

## 2021-06-17 DIAGNOSIS — C50311 Malignant neoplasm of lower-inner quadrant of right female breast: Secondary | ICD-10-CM | POA: Diagnosis not present

## 2021-06-17 DIAGNOSIS — Z170 Estrogen receptor positive status [ER+]: Secondary | ICD-10-CM | POA: Insufficient documentation

## 2021-06-17 DIAGNOSIS — Z803 Family history of malignant neoplasm of breast: Secondary | ICD-10-CM | POA: Insufficient documentation

## 2021-06-17 DIAGNOSIS — Z800 Family history of malignant neoplasm of digestive organs: Secondary | ICD-10-CM | POA: Diagnosis not present

## 2021-06-17 DIAGNOSIS — Z808 Family history of malignant neoplasm of other organs or systems: Secondary | ICD-10-CM | POA: Insufficient documentation

## 2021-06-17 DIAGNOSIS — Z1379 Encounter for other screening for genetic and chromosomal anomalies: Secondary | ICD-10-CM | POA: Insufficient documentation

## 2021-06-17 DIAGNOSIS — Z8051 Family history of malignant neoplasm of kidney: Secondary | ICD-10-CM | POA: Insufficient documentation

## 2021-06-17 NOTE — Assessment & Plan Note (Signed)
05/29/2021:Screening mammogram detected right breast abnormality.  No ultrasound correlate.  Stereotactic biopsy revealed well-differentiated ductal adenocarcinoma with extracellular mucin grade 1, intermediate grade DCIS, ER 95%, PR 95%, Ki-67 less than 5%, HER2 1+ ? ?Pathology and radiology counseling:Discussed with the patient, the details of pathology including the type of breast cancer,the clinical staging, the significance of ER, PR and HER-2/neu receptors and the implications for treatment. After reviewing the pathology in detail, we proceeded to discuss the different treatment options between surgery, radiation, chemotherapy, antiestrogen therapies. ? ?Recommendations: ?1. Breast conserving surgery followed by ?2. Oncotype DX testing to determine if chemotherapy would be of any benefit followed by ?3. Adjuvant radiation therapy followed by ?4. Adjuvant antiestrogen therapy ? ?Oncotype counseling: I discussed Oncotype DX test. I explained to the patient that this is a 21 gene panel to evaluate patient tumors DNA to calculate recurrence score. This would help determine whether patient has high risk or low risk breast cancer. She understands that if her tumor was found to be high risk, she would benefit from systemic chemotherapy. If low risk, no need of chemotherapy. ? ?Return to clinic after surgery to discuss final pathology report and then determine if Oncotype DX testing will need to be sent. ? ? ?

## 2021-06-17 NOTE — Telephone Encounter (Signed)
I contacted Michelle Contreras to discuss her genetic testing results. No pathogenic variants were identified in the first 8 genes analyzed. Of note, we are still waiting on additional genes and will contact her when they are available. Detailed clinic note to follow. ? ?The test report has been scanned into EPIC and is located under the Molecular Pathology section of the Results Review tab.  A portion of the result report is included below for reference.  ? ?Michelle Passy, MS, [place] ?Genetic Counselor ?[email or web address] ?(P) [phone] ? ? ?

## 2021-06-18 ENCOUNTER — Ambulatory Visit: Payer: 59 | Attending: General Surgery

## 2021-06-18 DIAGNOSIS — R293 Abnormal posture: Secondary | ICD-10-CM | POA: Insufficient documentation

## 2021-06-18 DIAGNOSIS — C50311 Malignant neoplasm of lower-inner quadrant of right female breast: Secondary | ICD-10-CM | POA: Insufficient documentation

## 2021-06-18 DIAGNOSIS — Z170 Estrogen receptor positive status [ER+]: Secondary | ICD-10-CM | POA: Insufficient documentation

## 2021-06-18 NOTE — Therapy (Signed)
?OUTPATIENT PHYSICAL THERAPY BREAST CANCER BASELINE EVALUATION ? ? ?Patient Name: Michelle Contreras ?MRN: [id number] ?DOB:[birth year], 61 y.o., female ?Today's Date: 06/18/2021 ? ? PT End of Session - 06/18/21 1344   ? ? Visit Number 1   ? Number of Visits 2   ? Date for PT Re-Evaluation 08/13/21   ? PT Start Time 0103   ? PT Stop Time 8242   ? PT Time Calculation (min) 35 min   ? Activity Tolerance Patient tolerated treatment well   ? Behavior During Therapy [facility] for tasks assessed/performed   ? ?  ?  ? ?  ? ? ?Past Medical History:  ?Diagnosis Date  ? Allergy   ? Arthritis   ? neck  ? Breast cancer ([place]) 05/29/2021  ? DVT (deep venous thrombosis) ([place])   ? upper thight - right leg  ? Headache   ? migraines  ? HSV (herpes simplex virus) anogenital infection   ? Hx of  ? PONV (postoperative nausea and vomiting)   ? only with neck surgery.  No problems with any other surgery.  ? Sarcoma ([place])   ? Lower Extremitiy right leg  ? ?Past Surgical History:  ?Procedure Laterality Date  ? ABDOMINAL HYSTERECTOMY    ? amputation of lower limb Right 2001  ? right leg, below knee  ? CHOLECYSTECTOMY  09/07/2011  ? Procedure: LAPAROSCOPIC CHOLECYSTECTOMY WITH INTRAOPERATIVE CHOLANGIOGRAM;  Surgeon: Michelle Lasso, MD;  Location: [facility];  Service: General;  Laterality: N/A;  ? COLONOSCOPY  08/2010  ? polyps  ? LEG SURGERY Right 2017  ? LUMBAR LAMINECTOMY Left 07/17/2015  ? Procedure: MICRODISCECTOMY LUMBAR LAMINECTOMY  L4-L5 ON LEFT, CENTRAL DECOMPRESSION L4-L5 FOR SPINAL STENOSIS, FORAMINOTOMY L4-L5;  Surgeon: Michelle Maudlin, MD;  [facility];  Service: Orthopedics;  Laterality: Left;  ? NECK SURGERY    ? fusion w/ plate  ? removal of benign growth to r breast    ? ?Patient Active Problem List  ? Diagnosis Date Noted  ? Genetic testing 06/17/2021  ? Malignant neoplasm of lower-inner quadrant of right breast of female, estrogen receptor positive ([place]) 06/05/2021  ? Allergic rhinitis 11/01/2017  ? Spinal stenosis, lumbar region, with  neurogenic claudication 07/17/2015  ? Renal cyst 09/22/2011  ? Acute cholecystitis 09/07/2011  ? Family history of malignant neoplasm of gastrointestinal tract 08/14/2010  ? ? ?PCP: Aletha Halim., PA-C ? ?REFERRING PROVIDER: Jovita Kussmaul, MD ? ?REFERRING DIAG: Right breast Cancer ? ?THERAPY DIAG:  ?Malignant neoplasm of lower-inner quadrant of right breast of female, estrogen receptor positive ([name]) ? ?Abnormal posture ? ?ONSET DATE: 05/29/2021 ? ?SUBJECTIVE                                                                                                                                                                                          ? ?  SUBJECTIVE STATEMENT: ?Patient reports she is here today to be seen by her medical team for her newly diagnosed right breast cancer. Pt is going to have a right lumpectomy with SLNB but she does not have a date scheduled yet. ? ?PERTINENT HISTORY:  ?Patient was diagnosed after detecting abnormality on screening mammogram. ?It is ER+,PR+HER2 - with a Ki67 of 5%.  It is in the lower inner quadrant of the right breast.She has a prior history of malignant fibrous histiocytoma of the leg for which she underwent resection and radiation but then 3 years later it relapsed with bone involvement and therefore she required amputation of the leg. In 2002 she had a right ductectomy so she will have bilateral breast reconstruction in a week to a week and a half after her initial surgery. ? ?PATIENT GOALS   reduce lymphedema risk and learn post op HEP.  ? ?PAIN:  ?Are you having pain? No, but she is tender in the breast from biopsy ? ? ?PRECAUTIONS: Active CA ,Other: She has a prior history of malignant fibrous histiocytoma of the leg for which she underwent resection and radiation but then 3 years later it relapsed with bone involvement and therefore she required amputation of the leg, prior back surgery, history of DVT ? ?HAND DOMINANCE: right ? ?WEIGHT BEARING RESTRICTIONS  No ? ?FALLS:  ?Has patient fallen in last 6 months? No ? ?LIVING ENVIRONMENT: ?Patient lives with: husband ?Lives in: House/apartment ?Has following equipment at home: None ? ?OCCUPATION: retired, Press photographer with [employer] ? ?LEISURE: travelling, painting,golf, swimming ? ?PRIOR LEVEL OF FUNCTION: Independent ? ? ?OBJECTIVE ? ?COGNITION: ? Overall cognitive status: Within functional limits for tasks assessed   ? ?POSTURE:  ?Forward head and rounded shoulders posture ? ?UPPER EXTREMITY AROM/PROM: ? ?A/PROM RIGHT  06/18/2021 ?  ?Shoulder extension 72  ?Shoulder flexion 164  ?Shoulder abduction 180  ?Shoulder internal rotation 55  ?Shoulder external rotation 105  ?  (Blank rows = not tested) ? ?A/PROM LEFT  06/18/2021  ?Shoulder extension 65  ?Shoulder flexion 156  ?Shoulder abduction 180  ?Shoulder internal rotation 50  ?Shoulder external rotation 92  ?  (Blank rows = not tested) ? ? ?CERVICAL AROM: ?All within normal limits:  ? ? ? ? ?UPPER EXTREMITY STRENGTH: WNL ? ? ?LYMPHEDEMA ASSESSMENTS:  ? ?[place] RIGHT  06/18/2021  ?10 cm proximal to olecranon process 32.5  ?Olecranon process 28.0  ?10 cm proximal to ulnar styloid process 24.2  ?Just proximal to ulnar styloid process 17.9  ?Across hand at thumb web space 21.8  ?At base of 2nd digit 6.8  ?(Blank rows = not tested) ? ?[place] LEFT  06/18/2021  ?10 cm proximal to olecranon process 32.9  ?Olecranon process 28.2  ?10 cm proximal to ulnar styloid process 23.2  ?Just proximal to ulnar styloid process 17.7  ?Across hand at thumb web space 21.1  ?At base of 2nd digit 6.7  ?(Blank rows = not tested) ? ? ?L-DEX LYMPHEDEMA SCREENING: ?Unable secondary to prosthetic limb (right leg) ? ? ?QUICK DASH SURVEY:0 percent ? ? ?PATIENT EDUCATION:  ?Education details: Lymphedema risk reduction and post op shoulder/posture HEP ?Person educated: Patient ?Education method: Explanation, Demonstration, Handout ?Education comprehension: Patient verbalized understanding and returned  demonstration ? ? ?HOME EXERCISE PROGRAM: ?Patient was instructed today in a home exercise program today for post op shoulder range of motion. These included active assist shoulder flexion in sitting, scapular retraction, wall walking with shoulder abduction, and hands behind head external rotation.  She was encouraged to do these twice a day, holding 3 seconds and repeating 5 times when permitted by her physician. ? ? ?ASSESSMENT: ? ?CLINICAL IMPRESSION: ?Pts.Her multidisciplinary medical team met prior to her assessments to determine a recommended treatment plan. She is planning to have a right breast lumpectomy with SLNB and reconstruction. She will benefit from a post op PT reassessment to determine needs . ?Pt will benefit from skilled therapeutic intervention to improve on the following deficits: Decreased knowledge of precautions, impaired UE functional use, pain, decreased ROM, postural dysfunction. She is unable to have SOZO screens secondary to prosthetic limb ? ?PT treatment/interventions: ADL/self-care home management, pt/family education, therapeutic exercise ? ?REHAB POTENTIAL: Excellent ? ?CLINICAL DECISION MAKING: Stable/uncomplicated ? ?EVALUATION COMPLEXITY: Low ? ? ?GOALS: ?Goals reviewed with patient? YES ? ?LONG TERM GOALS: (STG=LTG) ? ? Name Target Date Goal status  ?1 Pt will be able to verbalize understanding of pertinent lymphedema risk reduction practices relevant to her dx specifically related to skin care.  ?Baseline:  No knowledge 06/18/2021 Achieved at eval  ?2 Pt will be able to return demo and/or verbalize understanding of the post op HEP related to regaining shoulder ROM. ?Baseline:  No knowledge 06/18/2021 Achieved at eval  ?3 Pt will be able to verbalize understanding of the importance of attending the post op After Breast CA Class for further lymphedema risk reduction education and therapeutic exercise.  ?Baseline:  No knowledge 06/18/2021 Achieved at eval  ?4 Pt will demo she has  regained full shoulder ROM and function post operatively compared to baselines.  ?Baseline: See objective measurements taken today. 08/13/2021 INITIAL  ? ? ? ?PLAN: ?PT FREQUENCY/DURATION: EVAL and 1 follow up appointm

## 2021-06-19 ENCOUNTER — Encounter: Payer: Self-pay | Admitting: *Deleted

## 2021-06-20 ENCOUNTER — Telehealth: Payer: Self-pay | Admitting: Genetic Counselor

## 2021-06-20 ENCOUNTER — Telehealth: Payer: Self-pay | Admitting: *Deleted

## 2021-06-20 ENCOUNTER — Encounter: Payer: Self-pay | Admitting: *Deleted

## 2021-06-20 NOTE — Telephone Encounter (Signed)
I contacted Ms. Michelle Contreras to discuss her genetic testing results. No pathogenic variants were identified in the 77 genes analyzed. Detailed clinic note to follow. ? ?The test report has been scanned into EPIC and is located under the Molecular Pathology section of the Results Review tab.  A portion of the result report is included below for reference.  ? ?Lucille Passy, MS, [place] ?Genetic Counselor ?[email or web address] ?(P) [phone] ? ? ?

## 2021-06-20 NOTE — Telephone Encounter (Signed)
Spoke to pt, denies questions or concerns regarding dx or treatment care plan. Encourage pt to call with needs. Received verbal understanding. Contact information provided ?

## 2021-06-23 ENCOUNTER — Ambulatory Visit: Payer: Self-pay | Admitting: Genetic Counselor

## 2021-06-23 ENCOUNTER — Telehealth: Payer: Self-pay | Admitting: Hematology and Oncology

## 2021-06-23 DIAGNOSIS — Z1379 Encounter for other screening for genetic and chromosomal anomalies: Secondary | ICD-10-CM

## 2021-06-23 NOTE — Telephone Encounter (Signed)
.  Called patient to schedule appointment per 3/31 inbasket, patient is aware of date and time.   ?

## 2021-06-23 NOTE — Progress Notes (Signed)
HPI:   ?Michelle Contreras was previously seen in the [facility] due to a personal and family history of cancer and concerns regarding a hereditary predisposition to cancer. Please refer to our prior cancer genetics clinic note for more information regarding our discussion, assessment and recommendations, at the time. Michelle Contreras's recent genetic test results were disclosed to her, as were recommendations warranted by these results. These results and recommendations are discussed in more detail below. ? ?CANCER HISTORY:  ?Oncology History  ?Malignant neoplasm of lower-inner quadrant of right breast of female, estrogen receptor positive ([place])  ?05/29/2021 Initial Diagnosis  ? Screening mammogram detected right breast abnormality.  No ultrasound correlate.  Stereotactic biopsy revealed well-differentiated ductal adenocarcinoma with extracellular mucin grade 1, intermediate grade DCIS, ER 95%, PR 95%, Ki-67 less than 5%, HER2 1+ ?  ?06/17/2021 Cancer Staging  ? Staging form: Breast, AJCC 8th Edition ?- Clinical: Stage Unknown (cTX, cN0, cM0, G1, ER+, PR+, HER2-) - Signed by Nicholas Lose, MD on 06/17/2021 ?Stage prefix: Initial diagnosis ?Histologic grading system: 3 grade system ? ?  ? Genetic Testing  ? [facility] CancerNext-Expanded was Negative. Report date is 06/18/2021. ? ?The CancerNext-Expanded gene panel offered by [facility] and includes sequencing, rearrangement, and RNA analysis for the following 77 genes: AIP, ALK, APC, ATM, AXIN2, BAP1, BARD1, BLM, BMPR1A, BRCA1, BRCA2, BRIP1, CDC73, CDH1, CDK4, CDKN1B, CDKN2A, CHEK2, CTNNA1, DICER1, FANCC, FH, FLCN, GALNT12, KIF1B, LZTR1, MAX, MEN1, MET, MLH1, MSH2, MSH3, MSH6, MUTYH, NBN, NF1, NF2, NTHL1, PALB2, PHOX2B, PMS2, POT1, PRKAR1A, PTCH1, PTEN, RAD51C, RAD51D, RB1, RECQL, RET, SDHA, SDHAF2, SDHB, SDHC, SDHD, SMAD4, SMARCA4, SMARCB1, SMARCE1, STK11, SUFU, TMEM127, TP53, TSC1, TSC2, VHL and XRCC2 (sequencing and deletion/duplication); EGFR, EGLN1,  HOXB13, KIT, MITF, PDGFRA, POLD1, and POLE (sequencing only); EPCAM and GREM1 (deletion/duplication only).  ?  ? ? ?FAMILY HISTORY:  ?We obtained a detailed, 4-generation family history.  Significant diagnoses are listed below: ?Family History  ?Problem Relation Age of Onset  ? Diabetes Mother   ? Kidney disease Mother   ?     dialysis  ? Lung cancer Mother 53  ?     lung  ? Kidney cancer Father 46  ? Breast cancer Father 57  ? Colon polyps Father   ?     >10 precancerous colon polyps  ? Heart disease Father   ?     ?  ? Other Sister   ?     Skin syndrome  ? Colon polyps Brother   ? Throat cancer Brother   ? Other Paternal Aunt   ?     Amyloidosis  ? Diabetes Maternal Grandfather   ? Colon cancer Other   ? Stomach cancer Neg Hx   ? Rectal cancer Neg Hx   ? ? ?    ?  ?  ?  ?Michelle Contreras's brother was diagnosed with throat cancer at age 31. Her mother was diagnosed with lung cancer at age 21, she died at age 61. Michelle Contreras's father was diagnosed with breast cancer at age 11 and kidney cancer at age 38, he had a history of numerous precancerous colon polyps (>10), he died due to metastatic kidney cancer at age 32. She has two paternal great uncles who had a history of colon cancer, they are deceased. Michelle Contreras is unaware of previous family history of genetic testing for hereditary cancer risks. There is no reported Ashkenazi Jewish ancestry.  ? ?GENETIC TEST RESULTS:  ?The [facility] CancerNext-Expanded Panel found no  pathogenic mutations.  ? ?The CancerNext-Expanded gene panel offered by [facility] and includes sequencing, rearrangement, and RNA analysis for the following 77 genes: AIP, ALK, APC, ATM, AXIN2, BAP1, BARD1, BLM, BMPR1A, BRCA1, BRCA2, BRIP1, CDC73, CDH1, CDK4, CDKN1B, CDKN2A, CHEK2, CTNNA1, DICER1, FANCC, FH, FLCN, GALNT12, KIF1B, LZTR1, MAX, MEN1, MET, MLH1, MSH2, MSH3, MSH6, MUTYH, NBN, NF1, NF2, NTHL1, PALB2, PHOX2B, PMS2, POT1, PRKAR1A, PTCH1, PTEN, RAD51C, RAD51D, RB1, RECQL, RET, SDHA, SDHAF2, SDHB,  SDHC, SDHD, SMAD4, SMARCA4, SMARCB1, SMARCE1, STK11, SUFU, TMEM127, TP53, TSC1, TSC2, VHL and XRCC2 (sequencing and deletion/duplication); EGFR, EGLN1, HOXB13, KIT, MITF, PDGFRA, POLD1, and POLE (sequencing only); EPCAM and GREM1 (deletion/duplication only).   ? ?The test report has been scanned into EPIC and is located under the Molecular Pathology section of the Results Review tab.  A portion of the result report is included below for reference. Genetic testing reported out on 06/18/2021.  ? ? ? ? ? ? ?Even though a pathogenic variant was not identified, possible explanations for her personal history of cancer may include: ?There may be no hereditary risk for cancer in the family. The cancers in Ms. Loth may be due to other genetic or environmental factors. ?There may be a gene mutation in one of these genes that current testing methods cannot detect, but that chance is small. ?There could be another gene that has not yet been discovered, or that we have not yet tested, that is responsible for the cancer diagnoses in the family. ? ?Therefore, it is important to remain in touch with cancer genetics in the future so that we can continue to offer Michelle Contreras the most up to date genetic testing.  ? ?ADDITIONAL GENETIC TESTING:  ?We discussed with [name] that her genetic testing was fairly extensive.  If there are genes identified to increase cancer risk that can be analyzed in the future, we would be happy to discuss and coordinate this testing at that time.   ? ?CANCER SCREENING RECOMMENDATIONS:  ?Michelle Contreras's test result is considered negative (normal).  This means that we have not identified a hereditary cause for her personal and family history of cancer at this time. Most cancers happen by chance and this negative test suggests that her cancer may fall into this category.   ? ?An individual's cancer risk and medical management are not determined by genetic test results alone. Overall cancer risk assessment  incorporates additional factors, including personal medical history, family history, and any available genetic information that may result in a personalized plan for cancer prevention and surveillance. Therefore, it is recommended she continue to follow the cancer management and screening guidelines provided by her oncology and primary healthcare provider. ? ?RECOMMENDATIONS FOR FAMILY MEMBERS:   ?Individuals in this family might be at some increased risk of developing cancer, over the general population risk, due to the family history of cancer. We recommend women in this family have a yearly mammogram beginning at age 32, or 19 years younger than the earliest onset of cancer, an annual clinical breast exam, and perform monthly breast self-exams. ? ?FOLLOW-UP:  ?Cancer genetics is a rapidly advancing field and it is possible that new genetic tests will be appropriate for her and/or her family members in the future. We encouraged her to remain in contact with cancer genetics on an annual basis so we can update her personal and family histories and let her know of advances in cancer genetics that may benefit this family.  ? ?Our contact number was provided. Ms.  Lortz's questions were answered to her satisfaction, and she knows she is welcome to call us at anytime with additional questions or concerns.  ? ?Lucille Passy, MS, [place] ?Genetic Counselor ?[email or web address] ?(P) [phone] ? ? ?

## 2021-06-24 ENCOUNTER — Ambulatory Visit: Payer: Self-pay | Admitting: General Surgery

## 2021-06-24 ENCOUNTER — Other Ambulatory Visit: Payer: Self-pay | Admitting: General Surgery

## 2021-06-24 DIAGNOSIS — C50311 Malignant neoplasm of lower-inner quadrant of right female breast: Secondary | ICD-10-CM

## 2021-06-26 ENCOUNTER — Encounter: Payer: Self-pay | Admitting: *Deleted

## 2021-07-02 ENCOUNTER — Encounter: Payer: Self-pay | Admitting: Genetic Counselor

## 2021-07-03 ENCOUNTER — Encounter (HOSPITAL_BASED_OUTPATIENT_CLINIC_OR_DEPARTMENT_OTHER): Payer: Self-pay | Admitting: General Surgery

## 2021-07-03 ENCOUNTER — Other Ambulatory Visit: Payer: Self-pay

## 2021-07-07 NOTE — Progress Notes (Signed)
? ?Patient Care Team: ?Amie Critchley as PCP - General (Physician Assistant) ? ?DIAGNOSIS:  ?Encounter Diagnosis  ?Name Primary?  ? Malignant neoplasm of lower-inner quadrant of right breast of female, estrogen receptor positive ([place])   ? ? ?SUMMARY OF ONCOLOGIC HISTORY: ?Oncology History  ?Malignant neoplasm of lower-inner quadrant of right breast of female, estrogen receptor positive ([place])  ?05/29/2021 Initial Diagnosis  ? Screening mammogram detected right breast abnormality.  No ultrasound correlate.  Stereotactic biopsy revealed well-differentiated ductal adenocarcinoma with extracellular mucin grade 1, intermediate grade DCIS, ER 95%, PR 95%, Ki-67 less than 5%, HER2 1+ ?  ?06/17/2021 Cancer Staging  ? Staging form: Breast, AJCC 8th Edition ?- Clinical: Stage Unknown (cTX, cN0, cM0, G1, ER+, PR+, HER2-) - Signed by Nicholas Lose, MD on 06/17/2021 ?Stage prefix: Initial diagnosis ?Histologic grading system: 3 grade system ? ?  ? Genetic Testing  ? [facility] CancerNext-Expanded was Negative. Report date is 06/18/2021. ? ?The CancerNext-Expanded gene panel offered by [facility] and includes sequencing, rearrangement, and RNA analysis for the following 77 genes: AIP, ALK, APC, ATM, AXIN2, BAP1, BARD1, BLM, BMPR1A, BRCA1, BRCA2, BRIP1, CDC73, CDH1, CDK4, CDKN1B, CDKN2A, CHEK2, CTNNA1, DICER1, FANCC, FH, FLCN, GALNT12, KIF1B, LZTR1, MAX, MEN1, MET, MLH1, MSH2, MSH3, MSH6, MUTYH, NBN, NF1, NF2, NTHL1, PALB2, PHOX2B, PMS2, POT1, PRKAR1A, PTCH1, PTEN, RAD51C, RAD51D, RB1, RECQL, RET, SDHA, SDHAF2, SDHB, SDHC, SDHD, SMAD4, SMARCA4, SMARCB1, SMARCE1, STK11, SUFU, TMEM127, TP53, TSC1, TSC2, VHL and XRCC2 (sequencing and deletion/duplication); EGFR, EGLN1, HOXB13, KIT, MITF, PDGFRA, POLD1, and POLE (sequencing only); EPCAM and GREM1 (deletion/duplication only).  ?  ?07/11/2021 Surgery  ? 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  ? ? ?CHIEF COMPLIANT: discuss final pathology report  ?  ? ?INTERVAL HISTORY: Michelle Contreras is a  61 y.o. female is here because of recent diagnosis of right breast cancer. She presents to the clinic today to discuss final pathology report. She states that surgery went well.  She is healing and recovering very well from recent surgery. ? ? ?ALLERGIES:  is allergic to adhesive [tape], gabapentin, tizanidine, tramadol, wound dressing adhesive, and other. ? ?MEDICATIONS:  ?Current Outpatient Medications  ?Medication Sig Dispense Refill  ? b complex vitamins capsule Take 1 capsule by mouth daily.      ? cetirizine (ZYRTEC) 10 MG tablet Take by mouth.    ? Cholecalciferol (VITAMIN D3) 10000 units TABS Take 1,000 Units by mouth daily.    ? estradiol (ESTRACE) 1 MG tablet Take 0.5 mg by mouth daily.    ? fluticasone (FLONASE) 50 MCG/ACT nasal spray Place into both nostrils daily.    ? HYDROcodone-acetaminophen (NORCO/VICODIN) 5-325 MG tablet Take 1 tablet by mouth every 4 (four) hours as needed for moderate pain. 10 tablet 0  ? levocetirizine (XYZAL) 5 MG tablet Take 2.5 mg by mouth every evening.    ? Multiple Vitamin (MULTIVITAMIN WITH MINERALS) TABS tablet Take 1 tablet by mouth daily.    ? pregabalin (LYRICA) 75 MG capsule Take 75 mg by mouth. As needed    ? SUMAtriptan (IMITREX) 100 MG tablet Take 100 mg by mouth every 2 (two) hours as needed for migraine or headache.     ? tretinoin (RETIN-A) 0.05 % cream Apply 1 application topically at bedtime.  Apply sparingly.    ? ?No current facility-administered medications for this visit.  ? ? ?PHYSICAL EXAMINATION: ?ECOG PERFORMANCE STATUS: 1 - Symptomatic but completely ambulatory ? ?Vitals:  ? 07/21/21 1421  ?BP: (!) 165/71  ?Pulse: 86  ?Resp: 19  ?Temp: 97.7 ?F (36.5 ?C)  ?SpO2: 96%  ? ?Filed Weights  ? 07/21/21  1421  ?Weight: 197 lb 1.6 oz (89.4 kg)  ? ?  ? ?LABORATORY DATA:  ?I have reviewed the data as listed ? ?  Latest Ref Rng & Units 06/25/2015  ?  9:10 PM 09/07/2011  ?  2:55 PM 09/07/2011  ?  7:15 AM  ?CMP  ?Glucose 65 - 99 mg/dL 110    112    ?BUN 6 - 20 mg/dL 16    8    ?Creatinine 0.44 - 1.00 mg/dL 0.65   0.87   0.93    ?Sodium 135 - 145 mmol/L 133    139    ?Potassium 3.5 - 5.1 mmol/L 4.0    3.6    ?Chloride 101 - 111 mmol/L 98    105    ?CO2 22 - 32 mmol/L 27    24    ?Calcium 8.9 - 10.3 mg/dL 8.8    8.1    ?Total Protein 6.5 - 8.1 g/dL 6.5    5.9    ?Total Bilirubin 0.3 - 1.2 mg/dL 0.8    0.6    ?Alkaline Phos 38 - 126 U/L 47    58    ?AST 15 - 41 U/L 24    17    ?ALT 14 - 54 U/L 23    22    ? ? ?Lab Results  ?Component Value Date  ? WBC 7.4 06/25/2015  ? HGB 13.9 06/25/2015  ? HCT 40.1 06/25/2015  ? MCV 87.0 06/25/2015  ? PLT 199 06/25/2015  ? NEUTROABS 5.2 06/25/2015  ? ? 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 ?Pathology counseling: I discussed the final pathology report of the patient provided  a copy of this report. I discussed the margins as well as lymph node surgeries. We also discussed the final staging along with previously performed ER/PR and HER-2/neu testing. ? ?Treatment plan: ?1. Oncotype DX testing  ?3. Adjuvant radiation therapy (she is still contemplating on the pros and cons of radiation) ?4. Adjuvant antiestrogen therapy (patient is not at all interested in antiestrogen therapy because of the perceived side effects of weight gain, vaginal dryness, hair loss, hot flashes and arthralgias) patient is also concerned that if  she gains weight then the prosthesis will become an issue and she will not be able to function well. ?She may decide to initiate antiestrogen therapy and if she cannot tolerate it she might discontinue it.  She has not made up her mind on this. ? ?Return to clinic based upon Oncotype DX test result ? ? ? ?No orders of the defined types were placed in this encounter. ? ?The patient has a good understanding of the overall plan. she agrees with it. she will call with any problems that may develop before the next visit here. ?Total time spent: 30 mins including  face to face time and time spent for planning, charting and co-ordination of care ? ? Harriette Ohara, MD ?07/21/21 ? ? ? I Gardiner Coins am scribing for Dr. Lindi Adie ? ?I have reviewed the above documentation for accuracy and completeness, and I agree with the above. ?  ?

## 2021-07-10 ENCOUNTER — Ambulatory Visit
Admission: RE | Admit: 2021-07-10 | Discharge: 2021-07-10 | Disposition: A | Discharge status: Home / Self Care | Payer: 59 | Source: Ambulatory Visit | Attending: General Surgery | Admitting: General Surgery

## 2021-07-10 DIAGNOSIS — C50311 Malignant neoplasm of lower-inner quadrant of right female breast: Secondary | ICD-10-CM

## 2021-07-10 DIAGNOSIS — R928 Other abnormal and inconclusive findings on diagnostic imaging of breast: Secondary | ICD-10-CM | POA: Diagnosis not present

## 2021-07-10 NOTE — Progress Notes (Signed)

## 2021-07-11 ENCOUNTER — Encounter (HOSPITAL_BASED_OUTPATIENT_CLINIC_OR_DEPARTMENT_OTHER)
Admission: RE | Disposition: A | Discharge status: Home / Self Care | Payer: Self-pay | Source: Home / Self Care | Attending: General Surgery

## 2021-07-11 ENCOUNTER — Ambulatory Visit (HOSPITAL_BASED_OUTPATIENT_CLINIC_OR_DEPARTMENT_OTHER)
Admission: RE | Admit: 2021-07-11 | Discharge: 2021-07-11 | Disposition: A | Discharge status: Home / Self Care | Payer: 59 | Attending: General Surgery | Admitting: General Surgery

## 2021-07-11 ENCOUNTER — Ambulatory Visit (HOSPITAL_BASED_OUTPATIENT_CLINIC_OR_DEPARTMENT_OTHER): Payer: 59 | Admitting: Anesthesiology

## 2021-07-11 ENCOUNTER — Encounter (HOSPITAL_BASED_OUTPATIENT_CLINIC_OR_DEPARTMENT_OTHER): Payer: Self-pay | Admitting: General Surgery

## 2021-07-11 ENCOUNTER — Ambulatory Visit
Admission: RE | Admit: 2021-07-11 | Discharge: 2021-07-11 | Disposition: A | Discharge status: Home / Self Care | Payer: 59 | Source: Ambulatory Visit | Attending: General Surgery | Admitting: General Surgery

## 2021-07-11 ENCOUNTER — Other Ambulatory Visit: Payer: Self-pay

## 2021-07-11 DIAGNOSIS — Z170 Estrogen receptor positive status [ER+]: Secondary | ICD-10-CM | POA: Insufficient documentation

## 2021-07-11 DIAGNOSIS — Z86718 Personal history of other venous thrombosis and embolism: Secondary | ICD-10-CM | POA: Diagnosis not present

## 2021-07-11 DIAGNOSIS — D0511 Intraductal carcinoma in situ of right breast: Secondary | ICD-10-CM | POA: Diagnosis not present

## 2021-07-11 DIAGNOSIS — C50911 Malignant neoplasm of unspecified site of right female breast: Secondary | ICD-10-CM | POA: Diagnosis not present

## 2021-07-11 DIAGNOSIS — Z803 Family history of malignant neoplasm of breast: Secondary | ICD-10-CM | POA: Diagnosis not present

## 2021-07-11 DIAGNOSIS — C50311 Malignant neoplasm of lower-inner quadrant of right female breast: Secondary | ICD-10-CM | POA: Diagnosis not present

## 2021-07-11 HISTORY — PX: BREAST LUMPECTOMY WITH RADIOACTIVE SEED AND SENTINEL LYMPH NODE BIOPSY: SHX6550

## 2021-07-11 SURGERY — BREAST LUMPECTOMY WITH RADIOACTIVE SEED AND SENTINEL LYMPH NODE BIOPSY
Anesthesia: Regional | Site: Breast | Laterality: Right

## 2021-07-11 MED ORDER — FENTANYL CITRATE (PF) 100 MCG/2ML IJ SOLN
INTRAMUSCULAR | Status: AC
  Filled 2021-07-11: qty 2

## 2021-07-11 MED ORDER — PHENYLEPHRINE HCL (PRESSORS) 10 MG/ML IV SOLN
INTRAVENOUS | Status: DC | PRN
  Administered 2021-07-11: 40 ug via INTRAVENOUS
  Administered 2021-07-11 (×3): 80 ug via INTRAVENOUS

## 2021-07-11 MED ORDER — DEXAMETHASONE SODIUM PHOSPHATE 4 MG/ML IJ SOLN
INTRAMUSCULAR | Status: DC | PRN
  Administered 2021-07-11: 10 mg via INTRAVENOUS

## 2021-07-11 MED ORDER — MAGTRACE LYMPHATIC TRACER
INTRAMUSCULAR | Status: DC | PRN
  Administered 2021-07-11: 2 mL via INTRAMUSCULAR

## 2021-07-11 MED ORDER — LIDOCAINE 2% (20 MG/ML) 5 ML SYRINGE
INTRAMUSCULAR | Status: AC
  Filled 2021-07-11: qty 5

## 2021-07-11 MED ORDER — KETOROLAC TROMETHAMINE 30 MG/ML IJ SOLN: 30.0000 mg | Freq: Once | INTRAMUSCULAR | Status: DC | PRN

## 2021-07-11 MED ORDER — PROPOFOL 10 MG/ML IV BOLUS
INTRAVENOUS | Status: AC
  Filled 2021-07-11: qty 20

## 2021-07-11 MED ORDER — ONDANSETRON HCL 4 MG/2ML IJ SOLN
INTRAMUSCULAR | Status: AC
  Filled 2021-07-11: qty 2

## 2021-07-11 MED ORDER — CHLORHEXIDINE GLUCONATE CLOTH 2 % EX PADS: 6.0000 | MEDICATED_PAD | Freq: Once | CUTANEOUS | Status: DC

## 2021-07-11 MED ORDER — BUPIVACAINE-EPINEPHRINE 0.25% -1:200000 IJ SOLN
INTRAMUSCULAR | Status: DC | PRN
  Administered 2021-07-11: 13 mL

## 2021-07-11 MED ORDER — FENTANYL CITRATE (PF) 100 MCG/2ML IJ SOLN
INTRAMUSCULAR | Status: DC | PRN
  Administered 2021-07-11: 25 ug via INTRAVENOUS

## 2021-07-11 MED ORDER — MIDAZOLAM HCL 2 MG/2ML IJ SOLN
INTRAMUSCULAR | Status: AC
  Filled 2021-07-11: qty 2

## 2021-07-11 MED ORDER — KETOROLAC TROMETHAMINE 30 MG/ML IJ SOLN
INTRAMUSCULAR | Status: DC | PRN
  Administered 2021-07-11: 30 mg via INTRAVENOUS

## 2021-07-11 MED ORDER — LACTATED RINGERS IV SOLN: INTRAVENOUS | Status: DC

## 2021-07-11 MED ORDER — CEFAZOLIN SODIUM-DEXTROSE 2-4 GM/100ML-% IV SOLN
2.0000 g | INTRAVENOUS | Status: AC
  Administered 2021-07-11: 2 g via INTRAVENOUS

## 2021-07-11 MED ORDER — HYDROCODONE-ACETAMINOPHEN 5-325 MG PO TABS: 1.0000 | ORAL_TABLET | ORAL | 0 refills | Status: DC | PRN

## 2021-07-11 MED ORDER — KETOROLAC TROMETHAMINE 30 MG/ML IJ SOLN
INTRAMUSCULAR | Status: AC
  Filled 2021-07-11: qty 1

## 2021-07-11 MED ORDER — OXYCODONE HCL 5 MG PO TABS
ORAL_TABLET | ORAL | Status: AC
  Filled 2021-07-11: qty 1

## 2021-07-11 MED ORDER — AMISULPRIDE (ANTIEMETIC) 5 MG/2ML IV SOLN: 10.0000 mg | Freq: Once | INTRAVENOUS | Status: DC | PRN

## 2021-07-11 MED ORDER — PROPOFOL 10 MG/ML IV BOLUS
INTRAVENOUS | Status: DC | PRN
  Administered 2021-07-11: 200 mg via INTRAVENOUS

## 2021-07-11 MED ORDER — ROPIVACAINE HCL 5 MG/ML IJ SOLN
INTRAMUSCULAR | Status: DC | PRN
  Administered 2021-07-11: 30 mL via PERINEURAL

## 2021-07-11 MED ORDER — DEXAMETHASONE SODIUM PHOSPHATE 10 MG/ML IJ SOLN
INTRAMUSCULAR | Status: AC
  Filled 2021-07-11: qty 1

## 2021-07-11 MED ORDER — CEFAZOLIN SODIUM-DEXTROSE 2-4 GM/100ML-% IV SOLN
INTRAVENOUS | Status: AC
  Filled 2021-07-11: qty 100

## 2021-07-11 MED ORDER — OXYCODONE HCL 5 MG PO TABS
5.0000 mg | ORAL_TABLET | Freq: Once | ORAL | Status: AC | PRN
  Administered 2021-07-11: 5 mg via ORAL

## 2021-07-11 MED ORDER — FENTANYL CITRATE (PF) 100 MCG/2ML IJ SOLN
100.0000 ug | Freq: Once | INTRAMUSCULAR | Status: AC
  Administered 2021-07-11: 100 ug via INTRAVENOUS

## 2021-07-11 MED ORDER — ACETAMINOPHEN 500 MG PO TABS
1000.0000 mg | ORAL_TABLET | ORAL | Status: AC
  Administered 2021-07-11: 1000 mg via ORAL

## 2021-07-11 MED ORDER — LIDOCAINE 2% (20 MG/ML) 5 ML SYRINGE
INTRAMUSCULAR | Status: DC | PRN
  Administered 2021-07-11: 60 mg via INTRAVENOUS

## 2021-07-11 MED ORDER — ONDANSETRON HCL 4 MG/2ML IJ SOLN
INTRAMUSCULAR | Status: DC | PRN
  Administered 2021-07-11: 4 mg via INTRAVENOUS

## 2021-07-11 MED ORDER — EPHEDRINE SULFATE (PRESSORS) 50 MG/ML IJ SOLN
INTRAMUSCULAR | Status: DC | PRN
  Administered 2021-07-11: 5 mg via INTRAVENOUS
  Administered 2021-07-11: 10 mg via INTRAVENOUS
  Administered 2021-07-11 (×2): 5 mg via INTRAVENOUS

## 2021-07-11 MED ORDER — FENTANYL CITRATE (PF) 100 MCG/2ML IJ SOLN
25.0000 ug | INTRAMUSCULAR | Status: DC | PRN
  Administered 2021-07-11 (×3): 50 ug via INTRAVENOUS

## 2021-07-11 MED ORDER — OXYCODONE HCL 5 MG/5ML PO SOLN: 5.0000 mg | Freq: Once | ORAL | Status: AC | PRN

## 2021-07-11 MED ORDER — MIDAZOLAM HCL 2 MG/2ML IJ SOLN
2.0000 mg | Freq: Once | INTRAMUSCULAR | Status: AC
  Administered 2021-07-11: 2 mg via INTRAVENOUS

## 2021-07-11 MED ORDER — EPHEDRINE 5 MG/ML INJ
INTRAVENOUS | Status: AC
  Filled 2021-07-11: qty 5

## 2021-07-11 MED ORDER — ACETAMINOPHEN 500 MG PO TABS
ORAL_TABLET | ORAL | Status: AC
  Filled 2021-07-11: qty 2

## 2021-07-11 SURGICAL SUPPLY — 40 items
ADH SKN CLS APL DERMABOND .7 (GAUZE/BANDAGES/DRESSINGS) ×1
APL PRP STRL LF DISP 70% ISPRP (MISCELLANEOUS) ×1
APPLIER CLIP 9.375 MED OPEN (MISCELLANEOUS) ×2
APR CLP MED 9.3 20 MLT OPN (MISCELLANEOUS) ×1
BLADE SURG 15 STRL LF DISP TIS (BLADE) ×1 IMPLANT
BLADE SURG 15 STRL SS (BLADE) ×2
CANISTER SUC SOCK COL 7IN (MISCELLANEOUS) IMPLANT
CANISTER SUCT 1200ML W/VALVE (MISCELLANEOUS) ×1 IMPLANT
CHLORAPREP W/TINT 26 (MISCELLANEOUS) ×2 IMPLANT
CLIP APPLIE 9.375 MED OPEN (MISCELLANEOUS) ×1 IMPLANT
COVER BACK TABLE 60X90IN (DRAPES) ×2 IMPLANT
COVER MAYO STAND STRL (DRAPES) ×2 IMPLANT
COVER PROBE W GEL 5X96 (DRAPES) ×3 IMPLANT
DERMABOND ADVANCED (GAUZE/BANDAGES/DRESSINGS) ×1
DERMABOND ADVANCED .7 DNX12 (GAUZE/BANDAGES/DRESSINGS) ×1 IMPLANT
DRAPE LAPAROSCOPIC ABDOMINAL (DRAPES) ×2 IMPLANT
DRAPE UTILITY XL STRL (DRAPES) ×2 IMPLANT
ELECT COATED BLADE 2.86 ST (ELECTRODE) ×2 IMPLANT
ELECT REM PT RETURN 9FT ADLT (ELECTROSURGICAL) ×2
ELECTRODE REM PT RTRN 9FT ADLT (ELECTROSURGICAL) ×1 IMPLANT
GLOVE BIO SURGEON STRL SZ7.5 (GLOVE) ×2 IMPLANT
GOWN STRL REUS W/ TWL LRG LVL3 (GOWN DISPOSABLE) ×2 IMPLANT
GOWN STRL REUS W/TWL LRG LVL3 (GOWN DISPOSABLE) ×6
KIT MARKER MARGIN INK (KITS) ×2 IMPLANT
NDL HYPO 25X1 1.5 SAFETY (NEEDLE) ×1 IMPLANT
NEEDLE HYPO 25X1 1.5 SAFETY (NEEDLE) ×2 IMPLANT
NS IRRIG 1000ML POUR BTL (IV SOLUTION) IMPLANT
PACK BASIN DAY SURGERY FS (CUSTOM PROCEDURE TRAY) ×2 IMPLANT
PENCIL SMOKE EVACUATOR (MISCELLANEOUS) ×2 IMPLANT
SLEEVE SCD COMPRESS KNEE MED (STOCKING) ×2 IMPLANT
SPONGE T-LAP 18X18 ~~LOC~~+RFID (SPONGE) ×2 IMPLANT
STAPLER VISISTAT 35W (STAPLE) ×1 IMPLANT
SUT MON AB 4-0 PC3 18 (SUTURE) ×4 IMPLANT
SUT VICRYL 3-0 CR8 SH (SUTURE) ×2 IMPLANT
SYR CONTROL 10ML LL (SYRINGE) ×2 IMPLANT
TOWEL GREEN STERILE FF (TOWEL DISPOSABLE) ×2 IMPLANT
TRACER MAGTRACE VIAL (MISCELLANEOUS) ×1 IMPLANT
TRAY FAXITRON CT DISP (TRAY / TRAY PROCEDURE) ×2 IMPLANT
TUBE CONNECTING 20X1/4 (TUBING) ×1 IMPLANT
YANKAUER SUCT BULB TIP NO VENT (SUCTIONS) ×1 IMPLANT

## 2021-07-11 NOTE — Transfer of Care (Signed)
Immediate Anesthesia Transfer of Care Note ? ?Patient: Michelle Contreras ? ?Procedure(s) Performed: RIGHT BREAST LUMPECTOMY WITH RADIOACTIVE SEED AND SENTINEL LYMPH NODE BIOPSY (Right: Breast) ? ?Patient Location: PACU ? ?Anesthesia Type:General ? ?Level of Consciousness: awake, alert  and oriented ? ?Airway & Oxygen Therapy: Patient Spontanous Breathing and Patient connected to nasal cannula oxygen ? ?Post-op Assessment: Report given to RN and Post -op Vital signs reviewed and stable ? ?Post vital signs: Reviewed and stable ? ?Last Vitals:  ?Vitals Value Taken Time  ?BP 134/74 07/11/21 1238  ?Temp    ?Pulse 88 07/11/21 1240  ?Resp 16 07/11/21 1240  ?SpO2 100 % 07/11/21 1240  ?Vitals shown include unvalidated device data. ? ?Last Pain:  ?Vitals:  ? 07/11/21 0924  ?TempSrc: Oral  ?PainSc: 0-No pain  ?   ? ?Patients Stated Pain Goal: 8 (07/11/21 1610) ? ?Complications: No notable events documented. ?

## 2021-07-11 NOTE — Discharge Instructions (Signed)
?  Post Anesthesia Home Care Instructions ? ?Activity: ?Get plenty of rest for the remainder of the day. A responsible individual must stay with you for 24 hours following the procedure.  ?For the next 24 hours, DO NOT: ?-Drive a car ?-Paediatric nurse ?-Drink alcoholic beverages ?-Take any medication unless instructed by your physician ?-Make any legal decisions or sign important papers. ? ?Meals: ?Start with liquid foods such as gelatin or soup. Progress to regular foods as tolerated. Avoid greasy, spicy, heavy foods. If nausea and/or vomiting occur, drink only clear liquids until the nausea and/or vomiting subsides. Call your physician if vomiting continues. ? ?Special Instructions/Symptoms: ?Your throat may feel dry or sore from the anesthesia or the breathing tube placed in your throat during surgery. If this causes discomfort, gargle with warm salt water. The discomfort should disappear within 24 hours. ? ?If you had a scopolamine patch placed behind your ear for the management of post- operative nausea and/or vomiting: ? ?1. The medication in the patch is effective for 72 hours, after which it should be removed.  Wrap patch in a tissue and discard in the trash. Wash hands thoroughly with soap and water. ?2. You may remove the patch earlier than 72 hours if you experience unpleasant side effects which may include dry mouth, dizziness or visual disturbances. ?3. Avoid touching the patch. Wash your hands with soap and water after contact with the patch. ?   Regional Anesthesia Blocks ? ?1. Numbness or the inability to move the "blocked" extremity may last from 3-48 hours after placement. The length of time depends on the medication injected and your individual response to the medication. If the numbness is not going away after 48 hours, call your surgeon. ? ?2. The extremity that is blocked will need to be protected until the numbness is gone and the  Strength has returned. Because you cannot feel it, you will  need to take extra care to avoid injury. Because it may be weak, you may have difficulty moving it or using it. You may not know what position it is in without looking at it while the block is in effect. ? ?3. For blocks in the legs and feet, returning to weight bearing and walking needs to be done carefully. You will need to wait until the numbness is entirely gone and the strength has returned. You should be able to move your leg and foot normally before you try and bear weight or walk. You will need someone to be with you when you first try to ensure you do not fall and possibly risk injury. ? ?4. Bruising and tenderness at the needle site are common side effects and will resolve in a few days. ? ?5. Persistent numbness or new problems with movement should be communicated to the surgeon or the [place] [phone] [place] [phone]).  ?Next tylenol 1:30pm  ?

## 2021-07-11 NOTE — Progress Notes (Signed)
Assisted Dr. Ellender with right, pectoralis, ultrasound guided block. Side rails up, monitors on throughout procedure. See vital signs in flow sheet. Tolerated Procedure well. 

## 2021-07-11 NOTE — Op Note (Signed)
07/11/2021 ? ?12:30 PM ? ?PATIENT:  Michelle Contreras  61 y.o. female ? ?PRE-OPERATIVE DIAGNOSIS:  RIGHT BREAST CANCER ? ?POST-OPERATIVE DIAGNOSIS:  RIGHT BREAST CANCER ? ?PROCEDURE:  Procedure(s): ?RIGHT BREAST LUMPECTOMY WITH RADIOACTIVE SEED LOCALIZATION AND DEEP RIGHT AXILLARY SENTINEL LYMPH NODE BIOPSY (Right) ? ?SURGEON:  Surgeon(s) and Role: ?   Jovita Kussmaul, MD - Primary ? ?PHYSICIAN ASSISTANT:  ? ?ASSISTANTS: none  ? ?ANESTHESIA:   local and general ? ?EBL:  10 mL  ? ?BLOOD ADMINISTERED:none ? ?DRAINS: none  ? ?LOCAL MEDICATIONS USED:  MARCAINE    ? ?SPECIMEN:  Source of Specimen:  right breast tissue with additional inferior and deep margin and sentinel node ? ?DISPOSITION OF SPECIMEN:  PATHOLOGY ? ?COUNTS:  YES ? ?TOURNIQUET:  * No tourniquets in log * ? ?DICTATION: .Dragon Dictation ? ?After informed consent was obtained the patient was brought to the operating room and placed in the supine position on the operating table.  After adequate induction of general anesthesia and the patient's right chest, breast, and axillary area were prepped with ChloraPrep, allowed to dry, and draped in usual sterile manner.  An appropriate timeout was performed.  Previously an I-125 seed was placed in the lower inner quadrant of the right breast to mark an area of invasive breast cancer.  At this point, 2 cc of iron oxide were injected into the subareolar plexus on the right and the breast was massaged for 5 minutes.  The neoprobe was set to I-125 in the area of radioactivity was readily identified.  The area around this was infiltrated with quarter percent Marcaine.  A incision was made along the lines marked out by the plastic surgeon for her upcoming reduction in the lower inner quadrant of the right breast with a 15 blade knife.  The incision was carried through the skin and subcutaneous tissue sharply with the electrocautery.  Dissection was then carried widely around the radioactive seed while checking the area of  radioactivity frequently.  Once this specimen was removed it was oriented with the appropriate paint colors.  A specimen radiograph was obtained that showed the clip and seed to be near the center of the specimen.  I did elect to take an additional inferior and deep margin and these were marked appropriately.  All of the tissue was then sent to pathology for further evaluation.  The dissection at this point had reached the muscle of the chest wall.  Hemostasis was achieved using the Bovie electrocautery.  The wound was irrigated with saline and infiltrated with more quarter percent Marcaine.  The deep layer of the wound was then closed with layers of interrupted 3-0 Vicryl stitches.  The skin was then closed with interrupted 4-0 Monocryl subcuticular stitches.  Attention was then turned to the right axilla.  The mag trace was used to identify a signal in the right axilla.  The area overlying this was infiltrated with quarter percent Marcaine.  A transversely oriented incision was made with a 15 blade knife overlying the area of the signal.  The incision was carried through the skin and subcutaneous tissue sharply with the electrocautery until the deep right axillary space was entered.  Blunt hemostat dissection was carried out under the direction of the mag trace.  I was able to identify a single lymph node with signal.  The lymph node was excised sharply with the electrocautery and the surrounding small vessels and lymphatics were controlled with clips.  This was sent as sentinel  node #1.  No other hot or palpable nodes were identified in the right axilla.  Hemostasis was achieved using the Bovie electrocautery.  The deep layer of the incision was then closed with interrupted 3-0 Vicryl stitches.  The skin was then closed with a running 4-0 Monocryl subcuticular stitch.  Dermabond dressings were applied.  The patient tolerated the procedure well.  At the end of the case all needle sponge and instrument counts were  correct.  The patient was then awakened and taken to recovery in stable condition. ? ?PLAN OF CARE: Discharge to home after PACU ? ?PATIENT DISPOSITION:  PACU - hemodynamically stable. ?  ?Delay start of Pharmacological VTE agent (>24hrs) due to surgical blood loss or risk of bleeding: not applicable ? ?

## 2021-07-11 NOTE — H&P (Signed)
?REFERRING PHYSICIAN: Kaplan, Kristen W, PA ? ?PROVIDER: PAUL STEPHEN TOTH, MD ? ?MRN: [id number] ?DOB: 10/04/1960 ?Subjective  ? ?Chief Complaint: No chief complaint on file. ? ? ?History of Present Illness: ?Michelle Contreras is a 61 y.o. female who is seen today as an office consultation for evaluation of No chief complaint on file. ?.  ? ?We are asked to see the patient in consultation by Dr. Kristen Kaplan to evaluate her for a new right breast cancer. The patient is a 61-year-old white female who recently went for a routine screening mammogram. At that time she was found to have a small mass in the lower inner quadrant of the right breast that likely measured a centimeter or less. The lymph nodes looked normal. The mass was biopsied and came back as an invasive ductal type of breast cancer that was ER and PR positive and HER2 negative with a Ki-67 of 5%. 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 She does have a significant family history of multiple cancers and several of her first-degree relatives including breast cancer in a she father. She does not smoke.  ? ?Review of Systems: ?A complete review of systems was obtained from the patient. I have reviewed this information and discussed as appropriate with the patient. See HPI as well for other ROS. ? ?ROS  ? ?Medical History: ?No past medical history on file. ? ?There is no problem list on file for this patient. ? ?No past surgical history on file.  ? ?Not on File ? ?No current outpatient medications on file prior to visit.  ? ?No current facility-administered medications on file prior to visit.  ? ?No family history on file.  ? ?Social History  ? ?Tobacco Use  ?Smoking Status Not on file  ?Smokeless Tobacco Not on file  ? ? ?Social History  ? ?Socioeconomic History  ? Marital status: Married  ? ?Objective:  ?There were no vitals filed for this visit.  ?There is no height or weight on file to calculate  BMI. ? ?Physical Exam ?Vitals reviewed.  ?Constitutional:  ?General: She is not in acute distress. ?Appearance: Normal appearance.  ?HENT:  ?Head: Normocephalic and atraumatic.  ?Right Ear: External ear normal.  ?Left Ear: External ear normal.  ?Nose: Nose normal.  ?Mouth/Throat:  ?Mouth: Mucous membranes are moist.  ?Pharynx: Oropharynx is clear.  ?Eyes:  ?General: No scleral icterus. ?Extraocular Movements: Extraocular movements intact.  ?Conjunctiva/sclera: Conjunctivae normal.  ?Pupils: Pupils are equal, round, and reactive to light.  ?Cardiovascular:  ?Rate and Rhythm: Normal rate and regular rhythm.  ?Pulses: Normal pulses.  ?Heart sounds: Normal heart sounds.  ?Pulmonary:  ?Effort: Pulmonary effort is normal. No respiratory distress.  ?Breath sounds: Normal breath sounds.  ?Abdominal:  ?General: Bowel sounds are normal.  ?Palpations: Abdomen is soft.  ?Tenderness: There is no abdominal tenderness.  ?Musculoskeletal:  ?General: No swelling, tenderness or deformity. Normal range of motion.  ?Cervical back: Normal range of motion and neck supple.  ?Comments: There is an amputation of the right leg  ?Skin: ?General: Skin is warm and dry.  ?Coloration: Skin is not jaundiced.  ?Neurological:  ?General: No focal deficit present.  ?Mental Status: She is alert and oriented to person, place, and time.  ?Psychiatric:  ?Mood and Affect: Mood normal.  ?Behavior: Behavior normal.  ? ? ? ?Breast: There is no palpable mass in either breast. There is no palpable axillary, supraclavicular, or cervical lymphadenopathy. ? ?Labs, Imaging and   Diagnostic Testing: ? ?Assessment and Plan:  ? ?There are no diagnoses linked to this encounter.  ? ?The patient appears to have a small low-grade stage I cancer in the lower inner quadrant of the right breast with clinically negative nodes and all favorable markers. I have discussed with her in detail the different options for treatment and at this point she is undecided between lumpectomy  and mastectomy. She will be a good candidate for sentinel node biopsy either way. I will go ahead and refer her to medical and radiation oncology to discuss adjuvant therapy. I will also send her for genetics which may help her in her decision making especially given her strong family history of different cancers. We will also send her to physical therapy for preoperative lymphedema testing. I will refer her to plastic surgery to talk about reconstructive options should she choose mastectomy. She will notify me once she has made her final decision. I have discussed with her in detail the different risks and benefits of the surgeries as well as some of the technical aspects including the use of a radioactive seed for localization should she choose breast conservation and she understands. ?

## 2021-07-11 NOTE — Anesthesia Procedure Notes (Signed)
Anesthesia Regional Block: Pectoralis block  ? ?Pre-Anesthetic Checklist: , timeout performed,  Correct Patient, Correct Site, Correct Laterality,  Correct Procedure, Correct Position, site marked,  Risks and benefits discussed,  Surgical consent,  Pre-op evaluation,  At surgeon's request and post-op pain management ? ?Laterality: Right ? ?Prep: chloraprep     ?  ?Needles:  ?Injection technique: Single-shot ? ?Needle Type: Echogenic Stimulator Needle   ? ? ?Needle Length: 9cm  ?Needle Gauge: 21  ? ? ? ?Additional Needles: ? ? ?Procedures:,,,, ultrasound used (permanent image in chart),,    ?Narrative:  ?Start time: 07/11/2021 10:10 AM ?End time: 07/11/2021 10:20 AM ?Injection made incrementally with aspirations every 5 mL. ? ?Performed by: Personally  ?Anesthesiologist: Murvin Natal, MD ? ?Additional Notes: ?Functioning IV was confirmed and monitors were applied.  A timeout was performed. Sterile prep, hand hygiene and sterile gloves were used. A 18m 21ga Arrow echogenic stimulator needle was used. Negative aspiration and negative test dose prior to incremental administration of local anesthetic. The patient tolerated the procedure well. ? ?Ultrasound guidance: relevent anatomy identified, needle position confirmed, local anesthetic spread visualized around nerve(s), vascular puncture avoided.  Image printed for medical record.  ? ? ? ? ? ?

## 2021-07-11 NOTE — Interval H&P Note (Signed)
History and Physical Interval Note: ? ?07/11/2021 ?10:54 AM ? ?Michelle Contreras  has presented today for surgery, with the diagnosis of RIGHT BREAST CANCER.  The various methods of treatment have been discussed with the patient and family. After consideration of risks, benefits and other options for treatment, the patient has consented to  Procedure(s): ?RIGHT BREAST LUMPECTOMY WITH RADIOACTIVE SEED AND SENTINEL LYMPH NODE BIOPSY (Right) as a surgical intervention.  The patient's history has been reviewed, patient examined, no change in status, stable for surgery.  I have reviewed the patient's chart and labs.  Questions were answered to the patient's satisfaction.   ? ? ?[name] ? ? ?

## 2021-07-11 NOTE — Anesthesia Procedure Notes (Signed)
Procedure Name: LMA Insertion ?Date/Time: 07/11/2021 11:17 AM ?Performed by: Bufford Spikes, CRNA ?Pre-anesthesia Checklist: Patient identified, Emergency Drugs available, Suction available and Patient being monitored ?Patient Re-evaluated:Patient Re-evaluated prior to induction ?Oxygen Delivery Method: Circle system utilized ?Preoxygenation: Pre-oxygenation with 100% oxygen ?Induction Type: IV induction ?Ventilation: Mask ventilation without difficulty ?LMA: LMA inserted ?LMA Size: 4.0 ?Number of attempts: 1 ?Airway Equipment and Method: Bite block ?Placement Confirmation: positive ETCO2 ?Tube secured with: Tape ?Dental Injury: Teeth and Oropharynx as per pre-operative assessment  ? ? ? ? ?

## 2021-07-11 NOTE — Anesthesia Preprocedure Evaluation (Addendum)
Anesthesia Evaluation  ?Patient identified by MRN, date of birth, ID band ?Patient awake ? ? ? ?Reviewed: ?Allergy & Precautions, NPO status , Patient's Chart, lab work & pertinent test results ? ?History of Anesthesia Complications ?(+) PONV and history of anesthetic complications ? ?Airway ?Mallampati: II ? ?TM Distance: >3 FB ?Neck ROM: Full ? ? ? Dental ?no notable dental hx. ? ?  ?Pulmonary ?neg pulmonary ROS,  ?  ?Pulmonary exam normal ? ? ? ? ? ? ? Cardiovascular ?+ DVT  ?Normal cardiovascular exam ? ? ?  ?Neuro/Psych ? Headaches, negative psych ROS  ? GI/Hepatic ?negative GI ROS, Neg liver ROS,   ?Endo/Other  ?negative endocrine ROS ? Renal/GU ?negative Renal ROS  ? ?  ?Musculoskeletal ? ?(+) Arthritis ,  ? Abdominal ?  ?Peds ? Hematology ?negative hematology ROS ?(+)   ?Anesthesia Other Findings ?RIGHT BREAST CANCER ? Reproductive/Obstetrics ? ?  ? ? ? ? ? ? ? ? ? ? ? ? ? ?  ?  ? ? ? ? ? ? ? ?Anesthesia Physical ?Anesthesia Plan ? ?ASA: 2 ? ?Anesthesia Plan: General and Regional  ? ?Post-op Pain Management: Regional block*  ? ?Induction: Intravenous ? ?PONV Risk Score and Plan: 4 or greater and Ondansetron, Dexamethasone, Midazolam and Treatment may vary due to age or medical condition ? ?Airway Management Planned: LMA ? ?Additional Equipment:  ? ?Intra-op Plan:  ? ?Post-operative Plan: Extubation in OR ? ?Informed Consent: I have reviewed the patients History and Physical, chart, labs and discussed the procedure including the risks, benefits and alternatives for the proposed anesthesia with the patient or authorized representative who has indicated his/her understanding and acceptance.  ? ? ? ?Dental advisory given ? ?Plan Discussed with: CRNA ? ?Anesthesia Plan Comments:   ? ? ? ? ? ?Anesthesia Quick Evaluation ? ?

## 2021-07-11 NOTE — Anesthesia Postprocedure Evaluation (Signed)
Anesthesia Post Note ? ?Patient: ISABELLY KOBLER ? ?Procedure(s) Performed: RIGHT BREAST LUMPECTOMY WITH RADIOACTIVE SEED AND SENTINEL LYMPH NODE BIOPSY (Right: Breast) ? ?  ? ?Patient location during evaluation: PACU ?Anesthesia Type: Regional and General ?Level of consciousness: awake ?Pain management: pain level controlled ?Vital Signs Assessment: post-procedure vital signs reviewed and stable ?Respiratory status: spontaneous breathing, nonlabored ventilation, respiratory function stable and patient connected to nasal cannula oxygen ?Cardiovascular status: blood pressure returned to baseline and stable ?Postop Assessment: no apparent nausea or vomiting ?Anesthetic complications: no ? ? ?No notable events documented. ? ?Last Vitals:  ?Vitals:  ? 07/11/21 1330 07/11/21 1351  ?BP: 127/74 132/79  ?Pulse: 83 92  ?Resp: 10 16  ?Temp:  (!) 36.2 ?C  ?SpO2: 97% 98%  ?  ?Last Pain:  ?Vitals:  ? 07/11/21 1330  ?TempSrc:   ?PainSc: 4   ? ? ?  ?  ?  ?  ?  ?  ? ?Ellesse Antenucci P Desiderio Dolata ? ? ? ? ?

## 2021-07-14 ENCOUNTER — Other Ambulatory Visit: Payer: Self-pay

## 2021-07-14 ENCOUNTER — Encounter (HOSPITAL_BASED_OUTPATIENT_CLINIC_OR_DEPARTMENT_OTHER): Payer: Self-pay | Admitting: Plastic Surgery

## 2021-07-14 LAB — SURGICAL PATHOLOGY

## 2021-07-15 ENCOUNTER — Telehealth: Payer: Self-pay | Admitting: *Deleted

## 2021-07-15 ENCOUNTER — Encounter: Payer: Self-pay | Admitting: *Deleted

## 2021-07-15 NOTE — Telephone Encounter (Signed)
Received order for oncotype testing. Requisition faxed to pathology and [facility] °

## 2021-07-21 ENCOUNTER — Inpatient Hospital Stay: Payer: 59 | Attending: Radiation Oncology | Admitting: Hematology and Oncology

## 2021-07-21 DIAGNOSIS — Z7901 Long term (current) use of anticoagulants: Secondary | ICD-10-CM | POA: Diagnosis not present

## 2021-07-21 DIAGNOSIS — Z86718 Personal history of other venous thrombosis and embolism: Secondary | ICD-10-CM | POA: Diagnosis not present

## 2021-07-21 DIAGNOSIS — C50311 Malignant neoplasm of lower-inner quadrant of right female breast: Secondary | ICD-10-CM | POA: Insufficient documentation

## 2021-07-21 DIAGNOSIS — I2699 Other pulmonary embolism without acute cor pulmonale: Secondary | ICD-10-CM | POA: Diagnosis not present

## 2021-07-21 DIAGNOSIS — Z170 Estrogen receptor positive status [ER+]: Secondary | ICD-10-CM | POA: Diagnosis not present

## 2021-07-21 NOTE — Assessment & Plan Note (Signed)
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 ?Pathology counseling: I discussed the final pathology report of the patient provided  a copy of this report. I discussed the margins as well as lymph node surgeries. We also discussed the final staging along with previously performed ER/PR and HER-2/neu testing. ? ?Treatment plan: ?1. Oncotype DX testing  ?3. Adjuvant radiation therapy followed by ?4. Adjuvant antiestrogen therapy (patient is not at all interested in antiestrogen therapy because of the perceived side effects of weight gain, vaginal dryness, hair loss, hot flashes and arthralgias) patient is also concerned that if she gains weight then the prosthesis will become an issue and she will not be able to function well. ? ?Return to clinic after radiation is complete ?

## 2021-07-21 NOTE — Progress Notes (Signed)
? ? ? ? ? ?  Patient Instructions ? ?The night before surgery:  ?No food after midnight. ONLY clear liquids after midnight ? ?The day of surgery (if you do NOT have diabetes):  ?Drink ONE (1) Pre-Surgery Clear Ensure as directed.   ?This drink was given to you during your hospital  ?pre-op appointment visit. ?The pre-op nurse will instruct you on the time to drink the  ?Pre-Surgery Ensure depending on your surgery time. ?Finish the drink at the designated time by the pre-op nurse.  ?Nothing else to drink after completing the  ?Pre-Surgery Clear Ensure. ? ?The day of surgery (if you have diabetes): ?Drink ONE (1) Gatorade 2 (G2) as directed. ?This drink was given to you during your hospital  ?pre-op appointment visit.  ?The pre-op nurse will instruct you on the time to drink the  ? Gatorade 2 (G2) depending on your surgery time. ?Color of the Gatorade may vary. Red is not allowed. ?Nothing else to drink after completing the  ?Gatorade 2 (G2). ? ?       If you have questions, please contact your surgeon?s office. Surgical soap given to patient, instructions given, patient verbalized understanding.  Surgical soap given to patient, instructions given, patient verbalized understanding.   ?

## 2021-07-22 ENCOUNTER — Encounter (HOSPITAL_BASED_OUTPATIENT_CLINIC_OR_DEPARTMENT_OTHER): Payer: Self-pay | Admitting: Plastic Surgery

## 2021-07-22 ENCOUNTER — Encounter (HOSPITAL_BASED_OUTPATIENT_CLINIC_OR_DEPARTMENT_OTHER)
Admission: RE | Disposition: A | Discharge status: Home / Self Care | Payer: Self-pay | Source: Home / Self Care | Attending: Plastic Surgery

## 2021-07-22 ENCOUNTER — Other Ambulatory Visit: Payer: Self-pay

## 2021-07-22 ENCOUNTER — Ambulatory Visit (HOSPITAL_BASED_OUTPATIENT_CLINIC_OR_DEPARTMENT_OTHER)
Admission: RE | Admit: 2021-07-22 | Discharge: 2021-07-22 | Disposition: A | Discharge status: Home / Self Care | Payer: 59 | Attending: Plastic Surgery | Admitting: Plastic Surgery

## 2021-07-22 ENCOUNTER — Ambulatory Visit (HOSPITAL_BASED_OUTPATIENT_CLINIC_OR_DEPARTMENT_OTHER): Payer: 59 | Admitting: Anesthesiology

## 2021-07-22 DIAGNOSIS — N6489 Other specified disorders of breast: Secondary | ICD-10-CM | POA: Diagnosis present

## 2021-07-22 DIAGNOSIS — Z89511 Acquired absence of right leg below knee: Secondary | ICD-10-CM | POA: Insufficient documentation

## 2021-07-22 DIAGNOSIS — N6012 Diffuse cystic mastopathy of left breast: Secondary | ICD-10-CM | POA: Diagnosis not present

## 2021-07-22 DIAGNOSIS — C50911 Malignant neoplasm of unspecified site of right female breast: Secondary | ICD-10-CM | POA: Diagnosis not present

## 2021-07-22 DIAGNOSIS — Z170 Estrogen receptor positive status [ER+]: Secondary | ICD-10-CM

## 2021-07-22 DIAGNOSIS — C50311 Malignant neoplasm of lower-inner quadrant of right female breast: Secondary | ICD-10-CM | POA: Diagnosis not present

## 2021-07-22 DIAGNOSIS — Z86718 Personal history of other venous thrombosis and embolism: Secondary | ICD-10-CM | POA: Diagnosis not present

## 2021-07-22 DIAGNOSIS — Z853 Personal history of malignant neoplasm of breast: Secondary | ICD-10-CM | POA: Diagnosis not present

## 2021-07-22 DIAGNOSIS — Z923 Personal history of irradiation: Secondary | ICD-10-CM | POA: Diagnosis not present

## 2021-07-22 DIAGNOSIS — C50312 Malignant neoplasm of lower-inner quadrant of left female breast: Secondary | ICD-10-CM | POA: Diagnosis not present

## 2021-07-22 HISTORY — PX: MASTOPEXY: SHX5358

## 2021-07-22 HISTORY — PX: BREAST RECONSTRUCTION: SHX9

## 2021-07-22 SURGERY — RECONSTRUCTION, BREAST
Anesthesia: General | Site: Breast | Laterality: Right

## 2021-07-22 MED ORDER — ROCURONIUM BROMIDE 100 MG/10ML IV SOLN
INTRAVENOUS | Status: DC | PRN
  Administered 2021-07-22: 10 mg via INTRAVENOUS
  Administered 2021-07-22: 20 mg via INTRAVENOUS
  Administered 2021-07-22: 50 mg via INTRAVENOUS

## 2021-07-22 MED ORDER — MIDAZOLAM HCL 5 MG/5ML IJ SOLN
INTRAMUSCULAR | Status: DC | PRN
Start: 2021-07-22 — End: 2021-07-22
  Administered 2021-07-22: 2 mg via INTRAVENOUS

## 2021-07-22 MED ORDER — ACETAMINOPHEN 500 MG PO TABS
1000.0000 mg | ORAL_TABLET | ORAL | Status: AC
  Administered 2021-07-22: 1000 mg via ORAL

## 2021-07-22 MED ORDER — DEXAMETHASONE SODIUM PHOSPHATE 10 MG/ML IJ SOLN
INTRAMUSCULAR | Status: AC
  Filled 2021-07-22: qty 1

## 2021-07-22 MED ORDER — LIDOCAINE 2% (20 MG/ML) 5 ML SYRINGE
INTRAMUSCULAR | Status: AC
  Filled 2021-07-22: qty 5

## 2021-07-22 MED ORDER — OXYCODONE HCL 5 MG/5ML PO SOLN: 5.0000 mg | Freq: Once | ORAL | Status: AC | PRN

## 2021-07-22 MED ORDER — PHENYLEPHRINE 80 MCG/ML (10ML) SYRINGE FOR IV PUSH (FOR BLOOD PRESSURE SUPPORT)
PREFILLED_SYRINGE | INTRAVENOUS | Status: AC
  Filled 2021-07-22: qty 10

## 2021-07-22 MED ORDER — FENTANYL CITRATE (PF) 100 MCG/2ML IJ SOLN
INTRAMUSCULAR | Status: AC
  Filled 2021-07-22: qty 2

## 2021-07-22 MED ORDER — 0.9 % SODIUM CHLORIDE (POUR BTL) OPTIME
TOPICAL | Status: DC | PRN
  Administered 2021-07-22 (×2): 1000 mL

## 2021-07-22 MED ORDER — DEXAMETHASONE SODIUM PHOSPHATE 4 MG/ML IJ SOLN
INTRAMUSCULAR | Status: DC | PRN
  Administered 2021-07-22: 5 mg via INTRAVENOUS

## 2021-07-22 MED ORDER — CEFAZOLIN SODIUM-DEXTROSE 2-4 GM/100ML-% IV SOLN
INTRAVENOUS | Status: AC
  Filled 2021-07-22: qty 100

## 2021-07-22 MED ORDER — ACETAMINOPHEN 500 MG PO TABS
ORAL_TABLET | ORAL | Status: AC
  Filled 2021-07-22: qty 2

## 2021-07-22 MED ORDER — ONDANSETRON HCL 4 MG/2ML IJ SOLN
INTRAMUSCULAR | Status: DC | PRN
  Administered 2021-07-22: 4 mg via INTRAVENOUS

## 2021-07-22 MED ORDER — CHLORHEXIDINE GLUCONATE CLOTH 2 % EX PADS: 6.0000 | MEDICATED_PAD | Freq: Once | CUTANEOUS | Status: DC

## 2021-07-22 MED ORDER — NITROGLYCERIN 2 % TD OINT
TOPICAL_OINTMENT | TRANSDERMAL | Status: DC | PRN
  Administered 2021-07-22: 0.5 [in_us] via TOPICAL

## 2021-07-22 MED ORDER — CELECOXIB 200 MG PO CAPS
200.0000 mg | ORAL_CAPSULE | ORAL | Status: AC
  Administered 2021-07-22: 200 mg via ORAL

## 2021-07-22 MED ORDER — ATROPINE SULFATE 0.4 MG/ML IV SOLN
INTRAVENOUS | Status: AC
  Filled 2021-07-22: qty 1

## 2021-07-22 MED ORDER — OXYCODONE HCL 5 MG PO TABS
5.0000 mg | ORAL_TABLET | Freq: Once | ORAL | Status: AC | PRN
  Administered 2021-07-22: 5 mg via ORAL

## 2021-07-22 MED ORDER — PROPOFOL 10 MG/ML IV BOLUS
INTRAVENOUS | Status: DC | PRN
  Administered 2021-07-22: 150 mg via INTRAVENOUS

## 2021-07-22 MED ORDER — OXYCODONE HCL 5 MG PO TABS
ORAL_TABLET | ORAL | Status: AC
  Filled 2021-07-22: qty 1

## 2021-07-22 MED ORDER — FENTANYL CITRATE (PF) 100 MCG/2ML IJ SOLN
INTRAMUSCULAR | Status: AC
Start: 2021-07-22 — End: ?
  Filled 2021-07-22: qty 2

## 2021-07-22 MED ORDER — CELECOXIB 200 MG PO CAPS
ORAL_CAPSULE | ORAL | Status: AC
  Filled 2021-07-22: qty 1

## 2021-07-22 MED ORDER — SCOPOLAMINE 1 MG/3DAYS TD PT72
MEDICATED_PATCH | TRANSDERMAL | Status: AC
  Filled 2021-07-22: qty 1

## 2021-07-22 MED ORDER — PROPOFOL 500 MG/50ML IV EMUL
INTRAVENOUS | Status: AC
  Filled 2021-07-22: qty 50

## 2021-07-22 MED ORDER — FENTANYL CITRATE (PF) 100 MCG/2ML IJ SOLN
INTRAMUSCULAR | Status: DC | PRN
  Administered 2021-07-22: 100 ug via INTRAVENOUS
  Administered 2021-07-22: 50 ug via INTRAVENOUS
  Administered 2021-07-22 (×2): 25 ug via INTRAVENOUS

## 2021-07-22 MED ORDER — CEFAZOLIN SODIUM-DEXTROSE 2-4 GM/100ML-% IV SOLN
2.0000 g | INTRAVENOUS | Status: AC
  Administered 2021-07-22: 2 g via INTRAVENOUS

## 2021-07-22 MED ORDER — OXYCODONE-ACETAMINOPHEN 10-325 MG PO TABS: 1.0000 | ORAL_TABLET | Freq: Four times a day (QID) | ORAL | 0 refills | Status: DC | PRN

## 2021-07-22 MED ORDER — LIDOCAINE HCL (CARDIAC) PF 100 MG/5ML IV SOSY
PREFILLED_SYRINGE | INTRAVENOUS | Status: DC | PRN
  Administered 2021-07-22: 60 mg via INTRAVENOUS

## 2021-07-22 MED ORDER — EPHEDRINE 5 MG/ML INJ
INTRAVENOUS | Status: AC
  Filled 2021-07-22: qty 5

## 2021-07-22 MED ORDER — AMISULPRIDE (ANTIEMETIC) 5 MG/2ML IV SOLN: 10.0000 mg | Freq: Once | INTRAVENOUS | Status: DC | PRN

## 2021-07-22 MED ORDER — EPHEDRINE SULFATE (PRESSORS) 50 MG/ML IJ SOLN
INTRAMUSCULAR | Status: DC | PRN
  Administered 2021-07-22: 10 mg via INTRAVENOUS

## 2021-07-22 MED ORDER — SUGAMMADEX SODIUM 500 MG/5ML IV SOLN
INTRAVENOUS | Status: DC | PRN
  Administered 2021-07-22: 200 mg via INTRAVENOUS

## 2021-07-22 MED ORDER — LACTATED RINGERS IV SOLN: INTRAVENOUS | Status: DC

## 2021-07-22 MED ORDER — ONDANSETRON HCL 4 MG/2ML IJ SOLN
INTRAMUSCULAR | Status: AC
  Filled 2021-07-22: qty 2

## 2021-07-22 MED ORDER — ROCURONIUM BROMIDE 10 MG/ML (PF) SYRINGE
PREFILLED_SYRINGE | INTRAVENOUS | Status: AC
Start: 2021-07-22 — End: ?
  Filled 2021-07-22: qty 10

## 2021-07-22 MED ORDER — SCOPOLAMINE 1 MG/3DAYS TD PT72
1.0000 | MEDICATED_PATCH | TRANSDERMAL | Status: DC
  Administered 2021-07-22: 1.5 mg via TRANSDERMAL

## 2021-07-22 MED ORDER — MIDAZOLAM HCL 2 MG/2ML IJ SOLN
INTRAMUSCULAR | Status: AC
  Filled 2021-07-22: qty 2

## 2021-07-22 MED ORDER — NITROGLYCERIN 2 % TD OINT
TOPICAL_OINTMENT | TRANSDERMAL | Status: AC
  Filled 2021-07-22: qty 30

## 2021-07-22 MED ORDER — BUPIVACAINE HCL (PF) 0.25 % IJ SOLN
INTRAMUSCULAR | Status: DC | PRN
  Administered 2021-07-22: 30 mL

## 2021-07-22 MED ORDER — FENTANYL CITRATE (PF) 100 MCG/2ML IJ SOLN
25.0000 ug | INTRAMUSCULAR | Status: DC | PRN
  Administered 2021-07-22 (×3): 50 ug via INTRAVENOUS

## 2021-07-22 MED ORDER — SUCCINYLCHOLINE CHLORIDE 200 MG/10ML IV SOSY
PREFILLED_SYRINGE | INTRAVENOUS | Status: AC
  Filled 2021-07-22: qty 10

## 2021-07-22 SURGICAL SUPPLY — 70 items
ADH SKN CLS APL DERMABOND .7 (GAUZE/BANDAGES/DRESSINGS) ×4
APL PRP STRL LF DISP 70% ISPRP (MISCELLANEOUS) ×4
BAG DECANTER FOR FLEXI CONT (MISCELLANEOUS) ×2 IMPLANT
BINDER BREAST LRG (GAUZE/BANDAGES/DRESSINGS) IMPLANT
BINDER BREAST MEDIUM (GAUZE/BANDAGES/DRESSINGS) IMPLANT
BINDER BREAST XLRG (GAUZE/BANDAGES/DRESSINGS) ×1 IMPLANT
BINDER BREAST XXLRG (GAUZE/BANDAGES/DRESSINGS) IMPLANT
BLADE SURG 10 STRL SS (BLADE) ×12 IMPLANT
BLADE SURG 15 STRL LF DISP TIS (BLADE) IMPLANT
BLADE SURG 15 STRL SS (BLADE)
BNDG GAUZE ELAST 4 BULKY (GAUZE/BANDAGES/DRESSINGS) ×6 IMPLANT
CANISTER SUCT 1200ML W/VALVE (MISCELLANEOUS) ×3 IMPLANT
CHLORAPREP W/TINT 26 (MISCELLANEOUS) ×6 IMPLANT
COVER BACK TABLE 60X90IN (DRAPES) ×3 IMPLANT
COVER MAYO STAND STRL (DRAPES) ×3 IMPLANT
DERMABOND ADVANCED (GAUZE/BANDAGES/DRESSINGS) ×2
DERMABOND ADVANCED .7 DNX12 (GAUZE/BANDAGES/DRESSINGS) ×4 IMPLANT
DRAIN CHANNEL 15F RND FF W/TCR (WOUND CARE) ×2 IMPLANT
DRAIN CHANNEL 19F RND (DRAIN) IMPLANT
DRAPE TOP ARMCOVERS (MISCELLANEOUS) ×3 IMPLANT
DRAPE U-SHAPE 76X120 STRL (DRAPES) ×3 IMPLANT
DRAPE UTILITY XL STRL (DRAPES) ×2 IMPLANT
DRSG PAD ABDOMINAL 8X10 ST (GAUZE/BANDAGES/DRESSINGS) ×6 IMPLANT
DRSG TEGADERM 2-3/8X2-3/4 SM (GAUZE/BANDAGES/DRESSINGS) IMPLANT
ELECT BLADE 4.0 EZ CLEAN MEGAD (MISCELLANEOUS)
ELECT COATED BLADE 2.86 ST (ELECTRODE) ×3 IMPLANT
ELECT REM PT RETURN 9FT ADLT (ELECTROSURGICAL) ×3
ELECTRODE BLDE 4.0 EZ CLN MEGD (MISCELLANEOUS) IMPLANT
ELECTRODE REM PT RTRN 9FT ADLT (ELECTROSURGICAL) ×2 IMPLANT
EVACUATOR SILICONE 100CC (DRAIN) ×2 IMPLANT
GAUZE SPONGE 4X4 12PLY STRL (GAUZE/BANDAGES/DRESSINGS) IMPLANT
GAUZE SPONGE 4X4 12PLY STRL LF (GAUZE/BANDAGES/DRESSINGS) IMPLANT
GLOVE BIO SURGEON STRL SZ 6 (GLOVE) ×6 IMPLANT
GLOVE BIOGEL PI IND STRL 7.0 (GLOVE) IMPLANT
GLOVE BIOGEL PI INDICATOR 7.0 (GLOVE) ×3
GLOVE ECLIPSE 6.5 STRL STRAW (GLOVE) ×1 IMPLANT
GOWN STRL REUS W/ TWL LRG LVL3 (GOWN DISPOSABLE) ×4 IMPLANT
GOWN STRL REUS W/TWL LRG LVL3 (GOWN DISPOSABLE) ×12
MARKER SKIN DUAL TIP RULER LAB (MISCELLANEOUS) IMPLANT
NDL HYPO 25X1 1.5 SAFETY (NEEDLE) ×2 IMPLANT
NEEDLE HYPO 25X1 1.5 SAFETY (NEEDLE) ×3 IMPLANT
NS IRRIG 1000ML POUR BTL (IV SOLUTION) ×4 IMPLANT
PACK BASIN DAY SURGERY FS (CUSTOM PROCEDURE TRAY) ×3 IMPLANT
PENCIL SMOKE EVACUATOR (MISCELLANEOUS) ×3 IMPLANT
PIN SAFETY STERILE (MISCELLANEOUS) ×1 IMPLANT
SHEET MEDIUM DRAPE 40X70 STRL (DRAPES) ×3 IMPLANT
SLEEVE SCD COMPRESS KNEE MED (STOCKING) ×3 IMPLANT
SPIKE FLUID TRANSFER (MISCELLANEOUS) IMPLANT
SPONGE T-LAP 18X18 ~~LOC~~+RFID (SPONGE) ×7 IMPLANT
STAPLER VISISTAT 35W (STAPLE) ×4 IMPLANT
STRIP CLOSURE SKIN 1/2X4 (GAUZE/BANDAGES/DRESSINGS) IMPLANT
SUT ETHILON 2 0 FS 18 (SUTURE) ×3 IMPLANT
SUT MNCRL AB 4-0 PS2 18 (SUTURE) ×5 IMPLANT
SUT PDS 3-0 CT2 (SUTURE)
SUT PDS AB 2-0 CT2 27 (SUTURE) ×1 IMPLANT
SUT PDS II 3-0 CT2 27 ABS (SUTURE) IMPLANT
SUT PROLENE 2 0 CT2 30 (SUTURE) IMPLANT
SUT VIC AB 3-0 PS1 18 (SUTURE) ×12
SUT VIC AB 3-0 PS1 18XBRD (SUTURE) ×2 IMPLANT
SUT VIC AB 3-0 SH 27 (SUTURE)
SUT VIC AB 3-0 SH 27X BRD (SUTURE) IMPLANT
SUT VICRYL 4-0 PS2 18IN ABS (SUTURE) ×4 IMPLANT
SYR 50ML LL SCALE MARK (SYRINGE) IMPLANT
SYR BULB IRRIG 60ML STRL (SYRINGE) ×3 IMPLANT
SYR CONTROL 10ML LL (SYRINGE) ×3 IMPLANT
TAPE MEASURE VINYL STERILE (MISCELLANEOUS) IMPLANT
TOWEL GREEN STERILE FF (TOWEL DISPOSABLE) ×6 IMPLANT
TUBE CONNECTING 20X1/4 (TUBING) ×3 IMPLANT
UNDERPAD 30X36 HEAVY ABSORB (UNDERPADS AND DIAPERS) ×6 IMPLANT
YANKAUER SUCT BULB TIP NO VENT (SUCTIONS) ×3 IMPLANT

## 2021-07-22 NOTE — Discharge Instructions (Addendum)
Tylenol given at 0930 today. Next dose due 530 pm. ? ?Medications similar to Ibuprofen also given at  0930 am. Next dose due after 330 pm. ? ? ? ? ? ?Post Anesthesia Home Care Instructions ? ?Activity: ?Get plenty of rest for the remainder of the day. A responsible individual must stay with you for 24 hours following the procedure.  ?For the next 24 hours, DO NOT: ?-Drive a car ?-Paediatric nurse ?-Drink alcoholic beverages ?-Take any medication unless instructed by your physician ?-Make any legal decisions or sign important papers. ? ?Meals: ?Start with liquid foods such as gelatin or soup. Progress to regular foods as tolerated. Avoid greasy, spicy, heavy foods. If nausea and/or vomiting occur, drink only clear liquids until the nausea and/or vomiting subsides. Call your physician if vomiting continues. ? ?Special Instructions/Symptoms: ?Your throat may feel dry or sore from the anesthesia or the breathing tube placed in your throat during surgery. If this causes discomfort, gargle with warm salt water. The discomfort should disappear within 24 hours. ? ?If you had a scopolamine patch placed behind your ear for the management of post- operative nausea and/or vomiting: ? ?1. The medication in the patch is effective for 72 hours, after which it should be removed.  Wrap patch in a tissue and discard in the trash. Wash hands thoroughly with soap and water. ?2. You may remove the patch earlier than 72 hours if you experience unpleasant side effects which may include dry mouth, dizziness or visual disturbances. ?3. Avoid touching the patch. Wash your hands with soap and water after contact with the patch. ?    ?  ? ? ?Call your surgeon if you experience:  ? ?1.  Fever over 101.0. ?2.  Inability to urinate. ?3.  Nausea and/or vomiting. ?4.  Extreme swelling or bruising at the surgical site. ?5.  Continued bleeding from the incision. ?6.  Increased pain, redness or drainage from the incision. ?7.  Problems related to  your pain medication. ?8.  Any problems and/or concerns  ? ? ?About my Jackson-Pratt Bulb Drain ? ?What is a Jackson-Pratt bulb? ?A Jackson-Pratt is a soft, round device used to collect drainage. It is connected to a long, thin drainage catheter, which is held in place by one or two small stiches near your surgical incision site. When the bulb is squeezed, it forms a vacuum, forcing the drainage to empty into the bulb. ? ?Emptying the Jackson-Pratt bulb- ?To empty the bulb: ?1. Release the plug on the top of the bulb. ?2. Pour the bulb's contents into a measuring container which your nurse will provide. ?3. Record the time emptied and amount of drainage. Empty the drain(s) as often as your     doctor or nurse recommends. ? ?Date                  Time                    Amount (Drain 1)                 Amount (Drain 2) ? ?_____________________________________________________________________ ? ?_____________________________________________________________________ ? ?_____________________________________________________________________ ? ?_____________________________________________________________________ ? ?_____________________________________________________________________ ? ?_____________________________________________________________________ ? ?_____________________________________________________________________ ? ?_____________________________________________________________________ ? ?Squeezing the Jackson-Pratt Bulb- ?To squeeze the bulb: ?1. Make sure the plug at the top of the bulb is open. ?2. Squeeze the bulb tightly in your fist. You will hear air squeezing from the bulb. ?3. Replace the plug while the bulb is squeezed. ?4. Use a safety pin  to attach the bulb to your clothing. This will keep the catheter from     pulling at the bulb insertion site. ? ?When to call your doctor- ?Call your doctor if: ?Drain site becomes red, swollen or hot. ?You have a fever greater than 101 degrees F. ?There is oozing  at the drain site. ?Drain falls out (apply a guaze bandage over the drain hole and secure it with tape). ?Drainage increases daily not related to activity patterns. (You will usually have more drainage when you are active than when you are resting.) ?Drainage has a bad odor. ?  ?

## 2021-07-22 NOTE — H&P (Signed)
Subjective:  ? ?Patient ID: Michelle Contreras is a 61 y.o. female. ? ?HPI ? ?Patient of Drs. Earlie Counts here for staged breast reconstruction. Presented following screening MMG showing right breast abnormality. Diagnostic MMG showed right breast mass LIQ. No [place] correlate. Axilla benign. Biopsy revealed well-differentiated ductal adenocarcinoma with extracellular mucin, intermediate grade DCIS, ER/PR+, HER2-.  ? ?She underwent lumpectomy with final pathology invasive mucinous carcinoma arising in papillary carcinoma with DICS 2.7 cm, invasive tumor present within inferior margin. Additional inferior margin with focal DCIS,  margins clear. 0/1 SLN ? ?Oncotype pending.  ? ?Genetics negative. ? ? In approximately 2002, reports right breast lower pole quadrantectomy ("ductectomy") performed at [facility] for bloody nipple discharge. Patient reports [place] present on final pathology. Following this has noted asymmetry breasts.  ? ?Current- uses soft bras. Not using insert. Wt up 15 lb over last year. ? ?PMH significant for RLE malignant fibrous histiocytoma s/p BKA age 36 with adjuvant radiation treatment. History RLE DVT- reports this was diagnosed following neuroma surgery stump within thigh. Completed anticoagulation course and per patient anticoagulation work up negative. ? 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 Lives with spouse. Accompanied by sister. ? ?Review of Systems ?Remainder 12 point review negative ? ?Objective:  ?Physical Exam ?Cardiovascular:  ?Rate and Rhythm: Normal rate. Normal heart sounds ?Pulmonary:  ?Effort: Pulmonary effort is normal. Clear to auscultation ? ? ?Breasts: no palpable masses ?Right<left volume ?SN to nipple R 27 L 29 cm ?BW R 18 L 19 cm ?Nipple to IMF R 7 L 9 cm ?Right areolar scar from 3 to 9 o clock ? ? ?Assessment:  ? ?Right breast ca LIQ ER+ s/p right lumpectomy SLN ? ?Plan:  ? ?Plan  oncoplastic reconstruction 7-10 d post lumpectomy to ensure pathologic  clearance. Reviewed mastopexy with anchor type scars, drains, post operative visits and limitations, recovery. Diminished sensation nipple and breast skin, risk of nipple loss, wound healing problems, asymmetry. Discussed will have some contraction of breast volume and increased firmness with radiation, less ptosis with aging. This can result in asymmetries long term. Discussed changes with wt gain, loss, aging. Discussed lumpectomy alone can result in NAC displacement, distortion contour breast following lumpectomy and RT, asymmetry breast volume and NAC position. Reviewed purpose of this type reconstruction to prevent these. Reviewed breast lift or trying to correct NAC displacement post RT more difficult, higher risk complications. Counseled I cannot assure her cup size. Reviewed can defer surgery until after therapies complete. In this setting would wait at least 6 months from end RT for any surgery. Reviewed increased risks complications in setting RT. Reviewed any complications from oncoplastic reconstruction procedure may delay start RT.  ? ?Counseled that I anticipate lumpectomy alone will result in further asymmetry breasts. Counseled that we anticipate right breast volume to contract with radiation. This may mean largely skin excision over right breast pending and preservation remainder breast tissue and breast tissue resection over left to match.  ? ? ?

## 2021-07-22 NOTE — Anesthesia Preprocedure Evaluation (Addendum)
Anesthesia Evaluation  ?Patient identified by MRN, date of birth, ID band ?Patient awake ? ? ? ?Reviewed: ?Allergy & Precautions, NPO status , Patient's Chart, lab work & pertinent test results ? ?History of Anesthesia Complications ?(+) PONV and history of anesthetic complications ? ?Airway ?Mallampati: II ? ?TM Distance: >3 FB ?Neck ROM: Full ? ? ? Dental ?no notable dental hx. ? ?  ?Pulmonary ?neg pulmonary ROS,  ?  ?Pulmonary exam normal ? ? ? ? ? ? ? Cardiovascular ?+ DVT  ?Normal cardiovascular exam ? ? ?  ?Neuro/Psych ? Headaches, negative psych ROS  ? GI/Hepatic ?negative GI ROS, Neg liver ROS,   ?Endo/Other  ?negative endocrine ROS ? Renal/GU ?Renal disease  ? ?  ?Musculoskeletal ? ?(+) Arthritis ,  ? Abdominal ?  ?Peds ? Hematology ?negative hematology ROS ?(+)   ?Anesthesia Other Findings ?right breast cancer, LIQ ERpositive ? Reproductive/Obstetrics ? ?  ? ? ? ? ? ? ? ? ? ? ? ? ? ?  ?  ? ? ? ? ? ? ? ?Anesthesia Physical ?Anesthesia Plan ? ?ASA: 2 ? ?Anesthesia Plan: General  ? ?Post-op Pain Management:   ? ?Induction: Intravenous ? ?PONV Risk Score and Plan: 4 or greater and Ondansetron, Dexamethasone, Midazolam, Treatment may vary due to age or medical condition and Scopolamine patch - Pre-op ? ?Airway Management Planned: Oral ETT ? ?Additional Equipment:  ? ?Intra-op Plan:  ? ?Post-operative Plan: Extubation in OR ? ?Informed Consent: I have reviewed the patients History and Physical, chart, labs and discussed the procedure including the risks, benefits and alternatives for the proposed anesthesia with the patient or authorized representative who has indicated his/her understanding and acceptance.  ? ? ? ?Dental advisory given ? ?Plan Discussed with: CRNA ? ?Anesthesia Plan Comments:   ? ? ? ? ? ?Anesthesia Quick Evaluation ? ?

## 2021-07-22 NOTE — Transfer of Care (Signed)
Immediate Anesthesia Transfer of Care Note ? ?Patient: Michelle Contreras ? ?Procedure(s) Performed: BREAST RECONSTRUCTION (Right: Breast) ?MASTOPEXY (Left: Breast) ? ?Patient Location: PACU ? ?Anesthesia Type:General ? ?Level of Consciousness: awake, alert , oriented, drowsy and patient cooperative ? ?Airway & Oxygen Therapy: Patient Spontanous Breathing and Patient connected to face mask oxygen ? ?Post-op Assessment: Report given to RN and Post -op Vital signs reviewed and stable ? ?Post vital signs: Reviewed and stable ? ?Last Vitals:  ?Vitals Value Taken Time  ?BP    ?Temp    ?Pulse    ?Resp    ?SpO2    ? ? ?Last Pain:  ?Vitals:  ? 07/22/21 0928  ?TempSrc: Oral  ?PainSc: 0-No pain  ?   ? ?  ? ?Complications: No notable events documented. ?

## 2021-07-22 NOTE — Op Note (Signed)
Operative Note  ? ?DATE OF OPERATION: 5.2.23 ? ?LOCATION: [facility]-outpatient ? ?SURGICAL DIVISION: Plastic Surgery ? ?PREOPERATIVE DIAGNOSES:  Right breast ca LIQ ER+ s/p right lumpectomy SLN ? ?POSTOPERATIVE DIAGNOSES:  same ? ?PROCEDURE:  Right oncoplastic breast reconstruction left breast mastopexy ? ?SURGEON: Irene Limbo MD MBA ? ?ASSISTANT: none ? ?ANESTHESIA:  General.  ? ?EBL: 45 ml ? ?COMPLICATIONS: None immediate.  ? ?INDICATIONS FOR PROCEDURE:  ?The patient, Michelle Contreras, is a 61 y.o. female born on [birth year], is here for staged breast reconstruction following right lumpectomy. ?  ?FINDINGS: Right mastopexy skin only excision 8 g Left mastopexy 137 g ? ?DESCRIPTION OF PROCEDURE:  ?The patient was marked standing in the preoperative area to mark sternal notch, chest midline, anterior axillary lines, inframammary folds. The location of new nipple areolar complex was marked at level of on inframammary fold on anterior surface breast by palpation. This was marked symmetric over bilateral breasts. With aid of Wise pattern marker, location of new nipple areolar complex and vertical limbs (6 cm) were marked by displacement of breasts along meridian. The patient was taken to the operating room. SCDs were placed and IV antibiotics were given. The patient's operative site was prepped and draped in a sterile fashion. A time out was performed and all information was confirmed to be correct.   ?  ?Over right breast, superior pedicle marked and nipple areolar complex incised with 42 mm diameter. Pedicle deepithlialized and developed to chest wall. Inferiorly based dermoglandular pedicle preserved for use as auto augmentation. This was advanced superiorly and secured to chest wall with interrupted 2-0 PDS suture. Medial and lateral flaps developed. Breast tailor tacked closed.  ?  ?I then directed attention to left breast superior pedicle marked and nipple areolar complex incised with 42 mm diameter.  Pedicle deepithlialized and developed to chest wall. Lower pole breast tissue excised. Medial and lateral flaps developed. Additional medial and lateral breast tissue excised. Breast tailor tacked closed, and patient brought to upright sitting position and assessed for symmetry. Patent returned to supine position. Breast cavities irrigated and hemostasis obtained. Local anethetic infiltrated throughout each breast. 15 Fr JP placed in each breast and secured with 2-0 nylon. Closure completed bilateral with 3-0 vicryl to approximate dermis along inframammary fold and vertical limb. NAC inset with 4-0 vicryl in dermis. Skin closure completed with 4-0 monocryl subcuticular throughout. Tissue adhesive applied. Dry dressing, breast binder applied. ? ?The patient was allowed to wake from anesthesia, extubated and taken to the recovery room in satisfactory condition.  ? ?SPECIMENS: right and left mastopexy ? ?DRAINS: 15 Fr JP in right and left breast ? ?Irene Limbo, MD MBA ?Plastic & Reconstructive Surgery ? ?Office/ physician access line after hours [phone]  ? ?

## 2021-07-22 NOTE — Anesthesia Procedure Notes (Addendum)
Procedure Name: Intubation ?Date/Time: 07/22/2021 11:32 AM ?Performed by: Willa Frater, CRNA ?Pre-anesthesia Checklist: Patient identified, Emergency Drugs available, Suction available and Patient being monitored ?Patient Re-evaluated:Patient Re-evaluated prior to induction ?Oxygen Delivery Method: Circle system utilized ?Preoxygenation: Pre-oxygenation with 100% oxygen ?Induction Type: IV induction ?Ventilation: Mask ventilation without difficulty ?Laryngoscope Size: Mac and 3 ?Grade View: Grade I ?Tube type: Oral ?Tube size: 7.0 mm ?Number of attempts: 1 ?Airway Equipment and Method: Stylet and Oral airway ?Placement Confirmation: ETT inserted through vocal cords under direct vision, positive ETCO2 and breath sounds checked- equal and bilateral ?Secured at: 22 cm ?Tube secured with: Tape ?Dental Injury: Teeth and Oropharynx as per pre-operative assessment  ? ? ? ? ?

## 2021-07-23 ENCOUNTER — Encounter (HOSPITAL_BASED_OUTPATIENT_CLINIC_OR_DEPARTMENT_OTHER): Payer: Self-pay | Admitting: Plastic Surgery

## 2021-07-23 LAB — SURGICAL PATHOLOGY

## 2021-07-23 NOTE — Anesthesia Postprocedure Evaluation (Signed)
Anesthesia Post Note ? ?Patient: Michelle Contreras ? ?Procedure(s) Performed: BREAST RECONSTRUCTION (Right: Breast) ?MASTOPEXY (Left: Breast) ? ?  ? ?Patient location during evaluation: PACU ?Anesthesia Type: General ?Level of consciousness: awake ?Pain management: pain level controlled ?Vital Signs Assessment: post-procedure vital signs reviewed and stable ?Respiratory status: spontaneous breathing, nonlabored ventilation, respiratory function stable and patient connected to nasal cannula oxygen ?Cardiovascular status: blood pressure returned to baseline and stable ?Postop Assessment: no apparent nausea or vomiting ?Anesthetic complications: no ? ? ?No notable events documented. ? ?Last Vitals:  ?Vitals:  ? 07/22/21 1536 07/22/21 1559  ?BP:  139/78  ?Pulse: (!) 106 (!) 107  ?Resp: 14 18  ?Temp:  36.6 ?C  ?SpO2: 99% 92%  ?  ?Last Pain:  ?Vitals:  ? 07/23/21 1046  ?TempSrc:   ?PainSc: 3   ? ? ?  ?  ?  ?  ?  ?  ? ?Williemae Muriel P Perina Salvaggio ? ? ? ? ?

## 2021-07-24 ENCOUNTER — Encounter (HOSPITAL_COMMUNITY): Payer: Self-pay

## 2021-07-28 ENCOUNTER — Encounter (HOSPITAL_COMMUNITY): Payer: Self-pay

## 2021-07-29 ENCOUNTER — Encounter: Payer: Self-pay | Admitting: *Deleted

## 2021-07-29 ENCOUNTER — Telehealth: Payer: Self-pay | Admitting: *Deleted

## 2021-07-29 DIAGNOSIS — Z170 Estrogen receptor positive status [ER+]: Secondary | ICD-10-CM

## 2021-07-29 NOTE — Telephone Encounter (Signed)
Received oncotype results of 20/6%. Called and spoke with patient and she is aware.  Referral placed for [facility]. ?

## 2021-07-31 ENCOUNTER — Emergency Department (HOSPITAL_COMMUNITY): Payer: 59

## 2021-07-31 ENCOUNTER — Other Ambulatory Visit: Payer: Self-pay

## 2021-07-31 ENCOUNTER — Encounter (HOSPITAL_COMMUNITY): Payer: Self-pay

## 2021-07-31 ENCOUNTER — Emergency Department (HOSPITAL_COMMUNITY)
Admission: EM | Admit: 2021-07-31 | Discharge: 2021-08-01 | Disposition: A | Discharge status: Home / Self Care | Payer: 59 | Attending: Emergency Medicine | Admitting: Emergency Medicine

## 2021-07-31 DIAGNOSIS — J9811 Atelectasis: Secondary | ICD-10-CM | POA: Diagnosis not present

## 2021-07-31 DIAGNOSIS — I2699 Other pulmonary embolism without acute cor pulmonale: Secondary | ICD-10-CM | POA: Diagnosis not present

## 2021-07-31 DIAGNOSIS — Z7901 Long term (current) use of anticoagulants: Secondary | ICD-10-CM | POA: Diagnosis not present

## 2021-07-31 DIAGNOSIS — R0609 Other forms of dyspnea: Secondary | ICD-10-CM | POA: Diagnosis not present

## 2021-07-31 DIAGNOSIS — I2694 Multiple subsegmental pulmonary emboli without acute cor pulmonale: Secondary | ICD-10-CM | POA: Diagnosis not present

## 2021-07-31 DIAGNOSIS — R079 Chest pain, unspecified: Secondary | ICD-10-CM | POA: Diagnosis not present

## 2021-07-31 DIAGNOSIS — R7989 Other specified abnormal findings of blood chemistry: Secondary | ICD-10-CM | POA: Diagnosis not present

## 2021-07-31 DIAGNOSIS — R944 Abnormal results of kidney function studies: Secondary | ICD-10-CM | POA: Insufficient documentation

## 2021-07-31 DIAGNOSIS — R0602 Shortness of breath: Secondary | ICD-10-CM | POA: Diagnosis not present

## 2021-07-31 LAB — TROPONIN I (HIGH SENSITIVITY)
Troponin I (High Sensitivity): <2 ng/L (ref <18)
Troponin I (High Sensitivity): 2 ng/L (ref <18)

## 2021-07-31 LAB — BASIC METABOLIC PANEL
Anion gap: 8 (ref 5–15)
BUN: 14 mg/dL (ref 8–23)
CO2: 26 mmol/L (ref 22–32)
Calcium: 9.3 mg/dL (ref 8.9–10.3)
Chloride: 107 mmol/L (ref 98–111)
Creatinine, Ser: 1.15 mg/dL — ABNORMAL HIGH (ref 0.44–1.00)
GFR, Estimated: 54 mL/min — ABNORMAL LOW (ref >60)
Glucose, Bld: 137 mg/dL — ABNORMAL HIGH (ref 70–99)
Potassium: 4.4 mmol/L (ref 3.5–5.1)
Sodium: 141 mmol/L (ref 135–145)

## 2021-07-31 LAB — CBC WITH DIFFERENTIAL/PLATELET
Abs Immature Granulocytes: 0.01 10*3/uL (ref 0.00–0.07)
Basophils Absolute: 0 10*3/uL (ref 0.0–0.1)
Basophils Relative: 0 %
Eosinophils Absolute: 0.1 10*3/uL (ref 0.0–0.5)
Eosinophils Relative: 1 %
HCT: 39.6 % (ref 36.0–46.0)
Hemoglobin: 13.1 g/dL (ref 12.0–15.0)
Immature Granulocytes: 0 %
Lymphocytes Relative: 30 %
Lymphs Abs: 2 10*3/uL (ref 0.7–4.0)
MCH: 29.4 pg (ref 26.0–34.0)
MCHC: 33.1 g/dL (ref 30.0–36.0)
MCV: 88.8 fL (ref 80.0–100.0)
Monocytes Absolute: 0.6 10*3/uL (ref 0.1–1.0)
Monocytes Relative: 9 %
Neutro Abs: 4 10*3/uL (ref 1.7–7.7)
Neutrophils Relative %: 60 %
Platelets: 268 10*3/uL (ref 150–400)
RBC: 4.46 MIL/uL (ref 3.87–5.11)
RDW: 12.8 % (ref 11.5–15.5)
WBC: 6.8 10*3/uL (ref 4.0–10.5)
nRBC: 0 % (ref 0.0–0.2)

## 2021-07-31 LAB — I-STAT BETA HCG BLOOD, ED (MC, WL, AP ONLY): I-stat hCG, quantitative: <5 m[IU]/mL (ref <5)

## 2021-07-31 MED ORDER — APIXABAN 5 MG PO TABS
10.0000 mg | ORAL_TABLET | Freq: Two times a day (BID) | ORAL | Status: DC
Start: 2021-07-31 — End: 2021-08-01
  Administered 2021-07-31: 10 mg via ORAL
  Filled 2021-07-31: qty 2

## 2021-07-31 MED ORDER — APIXABAN 5 MG PO TABS: ORAL_TABLET | ORAL | 1 refills | Status: DC

## 2021-07-31 MED ORDER — APIXABAN 5 MG PO TABS
5.0000 mg | ORAL_TABLET | Freq: Two times a day (BID) | ORAL | Status: DC
Start: 2021-08-07 — End: 2021-08-01

## 2021-07-31 MED ORDER — IOHEXOL 350 MG/ML SOLN
80.0000 mL | Freq: Once | INTRAVENOUS | Status: AC | PRN
  Administered 2021-07-31: 80 mL via INTRAVENOUS

## 2021-07-31 NOTE — Progress Notes (Signed)
ANTICOAGULATION CONSULT NOTE - Initial Consult ? ?Pharmacy Consult for apixaban ?Indication: pulmonary embolus ? ?Allergies  ?Allergen Reactions  ? Adhesive [Tape] Other (See Comments)  ?  Patient reports full thickness blister to adhesive - has to use paper tape  ? Gabapentin   ?  Other reaction(s): Mental Status Changes (intolerance)  ? Tizanidine Other (See Comments)  ?  "I feel really weak"  ? Tramadol Nausea And Vomiting  ? Wound Dressing Adhesive Other (See Comments)  ?  Patient reports full thickness blister to adhesive  ? Other Rash and Other (See Comments)  ?  Topical Agents like lotions - blisters  ? ? ?Patient Measurements: ?Height: '5\' 9"'$  (175.3 cm) ?Weight: 88.1 kg (194 lb 3.6 oz) ?IBW/kg (Calculated) : 66.2 ?Heparin Dosing Weight: N/A ? ?Vital Signs: ?Temp: 99.1 ?F (37.3 ?C) (05/11 2054) ?Temp Source: Oral (05/11 2054) ?BP: 124/76 (05/11 2145) ?Pulse Rate: 70 (05/11 2145) ? ?Labs: ?Recent Labs  ?  07/31/21 ?1517 07/31/21 ?1732  ?HGB 13.1  --   ?HCT 39.6  --   ?PLT 268  --   ?CREATININE 1.15*  --   ?TROPONINIHS <2 2  ? ? ?Estimated Creatinine Clearance: 60.8 mL/min (A) (by C-G formula based on SCr of 1.15 mg/dL (H)). ? ? ?Medical History: ?Past Medical History:  ?Diagnosis Date  ? Allergy   ? Arthritis   ? neck  ? Breast cancer ([place]) 05/29/2021  ? DVT (deep venous thrombosis) ([place])   ? upper thight - right leg  ? Headache   ? migraines  ? HSV (herpes simplex virus) anogenital infection   ? Hx of  ? PONV (postoperative nausea and vomiting)   ? only with neck surgery.  No problems with any other surgery.  ? Sarcoma ([place])   ? Lower Extremitiy right leg  ? ? ?Medications:  ?Scheduled:  ? apixaban  10 mg Oral BID  ? Followed by  ? [START ON 08/07/2021] apixaban  5 mg Oral BID  ? ? ?Assessment: ?61 yo F found to have new PE. H/x of DVT multiple years ago. Patient has breast cancer with recent surgery on 5/2.  ? ?Goal of Therapy:  ?Monitor platelets by anticoagulation protocol: Yes ?  ?Plan:  ?Apixaban 10 mg BID  x 7 days then 5 mg BID ?Patient counseled on medication and given coupon card ?Patient instructed to follow-up with outpatient oncologist/hematologist given prior event and multiple RF and to determine LOT ? ?Anderson Malta A Michelene Keniston ?07/31/2021,10:54 PM ? ? ?

## 2021-07-31 NOTE — ED Provider Triage Note (Signed)
Emergency Medicine Provider Triage Evaluation Note ? ?Michelle Contreras , a 61 y.o. female  was evaluated in triage.  Pt complains of shortness of breath for the past week however significantly worse today when patient went out for a walk.  Associated with chest pain today.  Currently reports shortness of breath at rest as well.  Has history of DVT back in 2016 or 2017.  Currently not on anticoagulation.  History of breast cancer.  Status post lumpectomy, and breast reconstruction surgeries in the past few weeks. ? ?Review of Systems  ?Positive: As above ?Negative: As above ? ?Physical Exam  ?BP (!) 152/79 (BP Location: Right Arm)   Pulse 87   Temp 99.1 ?F (37.3 ?C) (Oral)   Resp 20   Ht '5\' 9"'$  (1.753 m)   Wt 88.1 kg   LMP  (LMP Unknown)   SpO2 99%   BMI 28.68 kg/m?  ?Gen:   Awake, no distress   ?Resp:  Normal effort  ?MSK:   Moves extremities without difficulty  ?Other:   ? ?Medical Decision Making  ?Medically screening exam initiated at 3:11 PM.  Appropriate orders placed.  Michelle Contreras was informed that the remainder of the evaluation will be completed by another provider, this initial triage assessment does not replace that evaluation, and the importance of remaining in the ED until their evaluation is complete. ? ? ?  ?Evlyn Courier, PA-C ?07/31/21 1512 ? ?

## 2021-07-31 NOTE — ED Notes (Signed)
Patient currently in CT °

## 2021-07-31 NOTE — ED Provider Notes (Signed)
?[facility] ?Provider Note ? ? ?CSN: [id number] ?Arrival date & time: 07/31/21  1454 ? ?  ? ?History ? ?Chief Complaint  ?Patient presents with  ? Shortness of Breath  ? Chest Pain  ? ? ?Michelle Contreras is a 61 y.o. female. ? ?HPI ?She presents for evaluation of shortness of breath and chest pain with dyspnea on exertion.  She has noticed this gradually over the last several days.  She had breast reconstruction surgery, about a week ago, and prior lumpectomy several weeks prior to that.  She is following closely with plastic surgery, general surgery and oncology.  She has history of breast cancer.  She denies fever, chills, cough, nausea, vomiting, weakness or dizziness.  She has a remote history of lower extremity DVT.  She does not require prolonged anticoagulation.  She is not sure of the cause of a prior DVT. ?  ? ?Home Medications ?Prior to Admission medications   ?Medication Sig Start Date End Date Taking? Authorizing Provider  ?apixaban (ELIQUIS) 5 MG TABS tablet To twice daily for 7 days then 1 twice daily 07/31/21  Yes Daleen Bo, MD  ?b complex vitamins capsule Take 1 capsule by mouth daily.      [provider]  ?cetirizine (ZYRTEC) 10 MG tablet Take by mouth.    [provider]  ?Cholecalciferol (VITAMIN D3) 10000 units TABS Take 1,000 Units by mouth daily.    [provider]  ?estradiol (ESTRACE) 1 MG tablet Take 0.5 mg by mouth daily. 06/27/15   [provider]  ?fluticasone (FLONASE) 50 MCG/ACT nasal spray Place into both nostrils daily.    [provider]  ?levocetirizine (XYZAL) 5 MG tablet Take 2.5 mg by mouth every evening.    [provider]  ?Multiple Vitamin (MULTIVITAMIN WITH MINERALS) TABS tablet Take 1 tablet by mouth daily.    [provider]  ?oxyCODONE-acetaminophen (PERCOCET) 10-325 MG tablet Take 1 tablet by mouth every 6 (six) hours as needed for pain. 07/22/21 07/22/22  Irene Limbo,  MD  ?pregabalin (LYRICA) 75 MG capsule Take 75 mg by mouth. As needed    [provider]  ?SUMAtriptan (IMITREX) 100 MG tablet Take 100 mg by mouth every 2 (two) hours as needed for migraine or headache.  06/19/10   [provider]  ?tretinoin (RETIN-A) 0.05 % cream Apply 1 application topically at bedtime. Apply sparingly. 04/29/15   [provider]  ?   ? ?Allergies    ?Adhesive [tape], Gabapentin, Tizanidine, Tramadol, Wound dressing adhesive, and Other   ? ?Review of Systems   ?Review of Systems ? ?Physical Exam ?Updated Vital Signs ?BP (!) 138/7 (BP Location: Right Arm)   Pulse 68   Temp 98.6 ?F (37 ?C) (Oral)   Resp 18   Ht '5\' 9"'$  (1.753 m)   Wt 88.1 kg   LMP  (LMP Unknown)   SpO2 97%   BMI 28.68 kg/m?  ?Physical Exam ?Vitals and nursing note reviewed.  ?Constitutional:   ?   General: She is not in acute distress. ?   Appearance: She is well-developed. She is not ill-appearing or diaphoretic.  ?HENT:  ?   Head: Normocephalic and atraumatic.  ?   Right Ear: External ear normal.  ?   Left Ear: External ear normal.  ?Eyes:  ?   Conjunctiva/sclera: Conjunctivae normal.  ?   Pupils: Pupils are equal, round, and reactive to light.  ?Neck:  ?   Trachea: Phonation normal.  ?  Cardiovascular:  ?   Rate and Rhythm: Normal rate.  ?Pulmonary:  ?   Effort: Pulmonary effort is normal. No respiratory distress.  ?   Breath sounds: No stridor.  ?Abdominal:  ?   General: There is no distension.  ?   Tenderness: There is no abdominal tenderness.  ?Musculoskeletal:     ?   General: Normal range of motion.  ?   Cervical back: Normal range of motion and neck supple.  ?Skin: ?   General: Skin is warm and dry.  ?Neurological:  ?   Mental Status: She is alert and oriented to person, place, and time.  ?   Cranial Nerves: No cranial nerve deficit.  ?   Sensory: No sensory deficit.  ?   Motor: No abnormal muscle tone.  ?   Coordination: Coordination normal.  ?Psychiatric:     ?   Mood and Affect: Mood normal.      ?   Behavior: Behavior normal.     ?   Thought Content: Thought content normal.     ?   Judgment: Judgment normal.  ? ? ?ED Results / Procedures / Treatments   ?Labs ?(all labs ordered are listed, but only abnormal results are displayed) ?Labs Reviewed  ?BASIC METABOLIC PANEL - Abnormal; Notable for the following components:  ?    Result Value  ? Glucose, Bld 137 (*)   ? Creatinine, Ser 1.15 (*)   ? GFR, Estimated 54 (*)   ? All other components within normal limits  ?CBC WITH DIFFERENTIAL/PLATELET  ?I-STAT BETA HCG BLOOD, ED ([facility], [facility], [facility] ONLY)  ?TROPONIN I (HIGH SENSITIVITY)  ?TROPONIN I (HIGH SENSITIVITY)  ? ? ?EKG ?EKG Interpretation ? ?Date/Time:  Thursday Jul 31 2021 15:02:16 EDT ?Ventricular Rate:  84 ?PR Interval:  158 ?QRS Duration: 78 ?QT Interval:  268 ?QTC Calculation: 316 ?R Axis:   17 ?Text Interpretation: Normal sinus rhythm Nonspecific T wave abnormality Abnormal ECG When compared with ECG of 25-Jun-2015 21:00, PREVIOUS ECG IS PRESENT since last tracing no significant change Confirmed by Daleen Bo [phone]) on 07/31/2021 8:58:50 PM ? ?Radiology ?DG Chest 2 View ? ?Result Date: 07/31/2021 ?CLINICAL DATA:  Chest pain shortness of breath EXAM: CHEST - 2 VIEW COMPARISON:  09/15/2017 FINDINGS: The heart size and mediastinal contours are within normal limits. Both lungs are clear. The visualized skeletal structures are unremarkable. IMPRESSION: No acute abnormality of the lungs. Electronically Signed   By: Delanna Ahmadi M.D.   On: 07/31/2021 15:38  ? ?CT Angio Chest PE W and/or Wo Contrast ? ?Result Date: 07/31/2021 ?CLINICAL DATA:  Pulmonary embolism suspected, high probability. Shortness of breath, recent surgery. EXAM: CT ANGIOGRAPHY CHEST WITH CONTRAST TECHNIQUE: Multidetector CT imaging of the chest was performed using the standard protocol during bolus administration of intravenous contrast. Multiplanar CT image reconstructions and MIPs were obtained to evaluate the vascular anatomy. RADIATION DOSE  REDUCTION: This exam was performed according to the departmental dose-optimization program which includes automated exposure control, adjustment of the mA and/or kV according to patient size and/or use of iterative reconstruction technique. CONTRAST:  47m OMNIPAQUE IOHEXOL 350 MG/ML SOLN COMPARISON:  10/22/2017 FINDINGS: Cardiovascular: The heart is normal in size and there is no pericardial effusion. The aorta and pulmonary trunk are normal in caliber. There are small segmental and subsegmental pulmonary emboli in the right upper lobe right lower lobe and right middle lobe and left lower lobe. No evidence of right heart strain. Mediastinum/Nodes: No mediastinal, hilar, or axillary lymphadenopathy.  Surgical clips and fat stranding are noted in the right axilla which are likely postsurgical. The thyroid gland, trachea, and esophagus are within normal limits. Lungs/Pleura: Strandy atelectasis is noted bilaterally. No effusion or pneumothorax. Upper Abdomen: No acute abnormality. Musculoskeletal: Cervical spinal fusion hardware is noted. No acute or suspicious osseous abnormality. Review of the MIP images confirms the above findings. IMPRESSION: 1. Segmental and subsegmental pulmonary emboli bilaterally. No evidence of right heart strain. 2. Strandy atelectasis bilaterally. Critical findings were reported to Dr. Eulis Foster at 9:19 p.m. Electronically Signed   By: Brett Fairy M.D.   On: 07/31/2021 21:21   ? ?Procedures ?Procedures  ? ? ?Medications Ordered in ED ?Medications  ?iohexol (OMNIPAQUE) 350 MG/ML injection 80 mL (80 mLs Intravenous Contrast Given 07/31/21 2042)  ? ? ?ED Course/ Medical Decision Making/ A&P ?  ?                        ?Medical Decision Making ?Presenting for gradual onset shortness of breath, with history of breast surgery, reconstruction.  Remote history of DVT.  History of breast cancer.  Not receiving active chemotherapy.  No acute respiratory illnesses clinically. ? ?Problems  Addressed: ?Multiple subsegmental pulmonary emboli without acute cor pulmonale ([place]): acute illness or injury that poses a threat to life or bodily functions ?   Details: Etiology not clear ? ?Amount and/or Complexity of D

## 2021-07-31 NOTE — Discharge Instructions (Addendum)
The testing today indicates that you have pulmonary emboli.  We have therefore started you on an anticoagulant, apixaban, to prevent propagation of blood clots.  Avoid exertion, until you start feeling better.  Start the prescription, tomorrow morning.  Call your PCP for a follow-up appointment in 1 week.  They may want to refer you to a hematologist for further evaluation and treatment.  Return here if you feel you are having complications including weakness, dizziness, shortness of breath or chest pain.  Anticoagulates can cause bleeding, so try to avoid injuries. ? ? ?Information on my medicine - ELIQUIS? (apixaban) ? ?This medication education was reviewed with me or my healthcare representative as part of my discharge preparation. ? ?Why was Eliquis? prescribed for you? ?Eliquis? was prescribed to treat blood clots that may have been found in the veins of your legs (deep vein thrombosis) or in your lungs (pulmonary embolism) and to reduce the risk of them occurring again. ? ?What do You need to know about Eliquis? ? ?The starting dose is 10 mg (two 5 mg tablets) taken TWICE daily for the FIRST SEVEN (7) DAYS, then on (enter date)  08/08/2021  the dose is reduced to ONE 5 mg tablet taken TWICE daily.  Eliquis? may be taken with or without food.  ? ?Try to take the dose about the same time in the morning and in the evening. If you have difficulty swallowing the tablet whole please discuss with your pharmacist how to take the medication safely. ? ?Take Eliquis? exactly as prescribed and DO NOT stop taking Eliquis? without talking to the doctor who prescribed the medication.  Stopping may increase your risk of developing a new blood clot.  Refill your prescription before you run out. ? ?After discharge, you should have regular check-up appointments with your healthcare provider that is prescribing your Eliquis?. ?   ?What do you do if you miss a dose? ?If a dose of ELIQUIS? is not taken at the scheduled time, take  it as soon as possible on the same day and twice-daily administration should be resumed. The dose should not be doubled to make up for a missed dose. ? ?Important Safety Information ?A possible side effect of Eliquis? is bleeding. You should call your healthcare provider right away if you experience any of the following: ?Bleeding from an injury or your nose that does not stop. ?Unusual colored urine (red or dark brown) or unusual colored stools (red or black). ?Unusual bruising for unknown reasons. ?A serious fall or if you hit your head (even if there is no bleeding). ? ?Some medicines may interact with Eliquis? and might increase your risk of bleeding or clotting while on Eliquis?Marland Kitchen To help avoid this, consult your healthcare provider or pharmacist prior to using any new prescription or non-prescription medications, including herbals, vitamins, non-steroidal anti-inflammatory drugs (NSAIDs) and supplements. ? ?This website has more information on Eliquis? (apixaban): http://www.eliquis.com/eliquis/home  ?

## 2021-07-31 NOTE — ED Triage Notes (Signed)
Pt to ED with c/o shortness of breath and intermittent CP s/p lumpectomy and breast reconstruction, surgeries 3 weeks ago and 1 week ago. Hx DVT 2017.  ?

## 2021-08-01 ENCOUNTER — Encounter: Payer: Self-pay | Admitting: Hematology and Oncology

## 2021-08-01 ENCOUNTER — Telehealth: Payer: Self-pay | Admitting: Hematology and Oncology

## 2021-08-01 NOTE — Telephone Encounter (Signed)
.  Called patient to schedule appointment per 5/12 inbasket, patient is aware of date and time.   ?

## 2021-08-04 ENCOUNTER — Encounter: Payer: Self-pay | Admitting: *Deleted

## 2021-08-04 ENCOUNTER — Inpatient Hospital Stay (HOSPITAL_BASED_OUTPATIENT_CLINIC_OR_DEPARTMENT_OTHER): Payer: 59 | Admitting: Hematology and Oncology

## 2021-08-04 ENCOUNTER — Other Ambulatory Visit: Payer: Self-pay

## 2021-08-04 DIAGNOSIS — C50311 Malignant neoplasm of lower-inner quadrant of right female breast: Secondary | ICD-10-CM

## 2021-08-04 DIAGNOSIS — Z86718 Personal history of other venous thrombosis and embolism: Secondary | ICD-10-CM | POA: Diagnosis not present

## 2021-08-04 DIAGNOSIS — Z170 Estrogen receptor positive status [ER+]: Secondary | ICD-10-CM

## 2021-08-04 DIAGNOSIS — Z7901 Long term (current) use of anticoagulants: Secondary | ICD-10-CM | POA: Diagnosis not present

## 2021-08-04 DIAGNOSIS — I2699 Other pulmonary embolism without acute cor pulmonale: Secondary | ICD-10-CM | POA: Diagnosis not present

## 2021-08-04 MED ORDER — APIXABAN 5 MG PO TABS: ORAL_TABLET | ORAL | 4 refills | Status: DC

## 2021-08-04 NOTE — Progress Notes (Signed)
? ?Patient Care Team: ?Michelle Contreras as PCP - General (Physician Assistant) ? ?DIAGNOSIS:  ?Encounter Diagnosis  ?Name Primary?  ? Malignant neoplasm of lower-inner quadrant of right breast of female, estrogen receptor positive ([place])   ? ? ?SUMMARY OF ONCOLOGIC HISTORY: ?Oncology History  ?Malignant neoplasm of lower-inner quadrant of right breast of female, estrogen receptor positive ([place])  ?05/29/2021 Initial Diagnosis  ? Screening mammogram detected right breast abnormality.  No ultrasound correlate.  Stereotactic biopsy revealed well-differentiated ductal adenocarcinoma with extracellular mucin grade 1, intermediate grade DCIS, ER 95%, PR 95%, Ki-67 less than 5%, HER2 1+ ?  ?06/17/2021 Cancer Staging  ? Staging form: Breast, AJCC 8th Edition ?- Clinical: Stage Unknown (cTX, cN0, cM0, G1, ER+, PR+, HER2-) - Signed by Nicholas Lose, MD on 06/17/2021 ?Stage prefix: Initial diagnosis ?Histologic grading system: 3 grade system ? ?  ? Genetic Testing  ? [facility] CancerNext-Expanded was Negative. Report date is 06/18/2021. ? ?The CancerNext-Expanded gene panel offered by [facility] and includes sequencing, rearrangement, and RNA analysis for the following 77 genes: AIP, ALK, APC, ATM, AXIN2, BAP1, BARD1, BLM, BMPR1A, BRCA1, BRCA2, BRIP1, CDC73, CDH1, CDK4, CDKN1B, CDKN2A, CHEK2, CTNNA1, DICER1, FANCC, FH, FLCN, GALNT12, KIF1B, LZTR1, MAX, MEN1, MET, MLH1, MSH2, MSH3, MSH6, MUTYH, NBN, NF1, NF2, NTHL1, PALB2, PHOX2B, PMS2, POT1, PRKAR1A, PTCH1, PTEN, RAD51C, RAD51D, RB1, RECQL, RET, SDHA, SDHAF2, SDHB, SDHC, SDHD, SMAD4, SMARCA4, SMARCB1, SMARCE1, STK11, SUFU, TMEM127, TP53, TSC1, TSC2, VHL and XRCC2 (sequencing and deletion/duplication); EGFR, EGLN1, HOXB13, KIT, MITF, PDGFRA, POLD1, and POLE (sequencing only); EPCAM and GREM1 (deletion/duplication only).  ?  ?07/11/2021 Surgery  ? 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  ?07/22/2021 Oncotype testing  ? Oncotype score: 20, risk of distant recurrence at 9 years: 6% ?  ? ? ?CHIEF COMPLIANT: Discuss scans and recent emergency room visit for PE ? ?INTERVAL HISTORY: Michelle Contreras is a 61 y.o. female is here because of recent diagnosis of right breast cancer. She presents to the clinic today for a follow-up and discuss scans and regarding the recent emergency room visit for shortness of breath and was diagnosed with bilateral pulmonary emboli. She state that she had shortens of breath. She state when she take a deep breath she has coughing. She was concern about deep breathing. Sometimes she feel like she can't. She state sometimes she be having headaches. ?Since she started on Eliquis her symptoms have improved significantly. ? ?  ?ALLERGIES:  is allergic to adhesive [tape], gabapentin, tizanidine, tramadol, wound dressing adhesive, and other. ? ?MEDICATIONS:  ?Current Outpatient Medications  ?Medication Sig Dispense Refill  ? apixaban (ELIQUIS) 5 MG TABS tablet To twice daily for 7 days then 1 twice daily 60 tablet 4  ? b complex vitamins capsule Take 1 capsule by mouth daily.      ? cetirizine (ZYRTEC) 10 MG tablet Take by mouth.    ? Cholecalciferol (VITAMIN D3) 10000 units TABS Take 1,000 Units by mouth daily.    ? estradiol (ESTRACE) 1 MG tablet Take 0.5 mg by mouth daily.    ? fluticasone (FLONASE) 50 MCG/ACT nasal spray Place into both nostrils daily.    ? levocetirizine (XYZAL) 5 MG tablet Take 2.5 mg by mouth every evening.    ? Multiple Vitamin (  MULTIVITAMIN WITH MINERALS) TABS tablet Take 1 tablet by mouth daily.    ? oxyCODONE-acetaminophen (PERCOCET) 10-325 MG tablet Take 1 tablet by mouth every 6 (six) hours as needed for pain. 15 tablet 0  ? pregabalin (LYRICA) 75 MG capsule Take 75 mg by mouth. As needed    ?  SUMAtriptan (IMITREX) 100 MG tablet Take 100 mg by mouth every 2 (two) hours as needed for migraine or headache.     ? tretinoin (RETIN-A) 0.05 % cream Apply 1 application topically at bedtime. Apply sparingly.    ? ?No current facility-administered medications for this visit.  ? ? ?PHYSICAL EXAMINATION: ?ECOG PERFORMANCE STATUS: 1 - Symptomatic but completely ambulatory ? ?Vitals:  ? 08/04/21 1126  ?BP: 121/68  ?Pulse: 81  ?Resp: 19  ?Temp: 97.7 ?F (36.5 ?C)  ?SpO2: 99%  ? ?Filed Weights  ? 08/04/21 1126  ?Weight: 194 lb 9.6 oz (88.3 kg)  ? ?  ? ?LABORATORY DATA:  ?I have reviewed the data as listed ? ?  Latest Ref Rng & Units 07/31/2021  ?  3:17 PM 06/25/2015  ?  9:10 PM 09/07/2011  ?  2:55 PM  ?CMP  ?Glucose 70 - 99 mg/dL 137   110     ?BUN 8 - 23 mg/dL 14   16     ?Creatinine 0.44 - 1.00 mg/dL 1.15   0.65   0.87    ?Sodium 135 - 145 mmol/L 141   133     ?Potassium 3.5 - 5.1 mmol/L 4.4   4.0     ?Chloride 98 - 111 mmol/L 107   98     ?CO2 22 - 32 mmol/L 26   27     ?Calcium 8.9 - 10.3 mg/dL 9.3   8.8     ?Total Protein 6.5 - 8.1 g/dL  6.5     ?Total Bilirubin 0.3 - 1.2 mg/dL  0.8     ?Alkaline Phos 38 - 126 U/L  47     ?AST 15 - 41 U/L  24     ?ALT 14 - 54 U/L  23     ? ? ?Lab Results  ?Component Value Date  ? WBC 6.8 07/31/2021  ? HGB 13.1 07/31/2021  ? HCT 39.6 07/31/2021  ? MCV 88.8 07/31/2021  ? PLT 268 07/31/2021  ? NEUTROABS 4.0 07/31/2021  ? ? 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 ?Treatment plan: ?1. Adjuvant radiation therapy (she is still contemplating on the pros  and cons of radiation): She is leaning onto proceeding with radiation ?2. Adjuvant antiestrogen therapy (patient is not at all interested in antiestrogen therapy because of the perceived side effects of weight gain, vaginal dryness, hair loss, hot flashes and arthralgias)  ?----------------------------------------------------------------------------------------------------------------------------------- ?Emergency room visit for shortness of breath: CT angiogram: Segmental and subsegmental pulmonary emboli bilaterally ?Current treatment: Eliquis ? ?In 2019 she had extensive hypercoagulability work-up done at [facility] which was negative.  At that time she had a DVT after surgical procedure and she was treated with Xarelto for 3 months. ? ?Current anticoagulation plan: 6 months ?  Our plan is to obtain a CT chest in 6 months before stopping her anticoagulation plan. ? ?Return to clinic at the end of radiation. ? ? ?No orders of the defined types were placed in this encounter. ? ?The patient has a good understanding of the overall plan. she agrees with it. she will call with any problems that may develop before the next visit here. ?Total time spent: 30 mins including face to face time and time spent for planning, charting and co-ordination of care ? ? Harriette Ohara, MD ?08/04/21 ? ? ? I Gardiner Coins am scribing for Dr. Lindi Adie ? ?I have reviewed the above documentation for accuracy and completeness, and I agree with the above. ?  ?

## 2021-08-04 NOTE — Assessment & Plan Note (Signed)
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 ?Treatment plan: ?1. Adjuvant radiation therapy (she is still contemplating on the pros and cons of radiation) ?2. Adjuvant antiestrogen therapy?(patient is not at all interested in antiestrogen therapy because of the perceived side effects of weight gain, vaginal dryness, hair loss, hot flashes and arthralgias)  ?----------------------------------------------------------------------------------------------------------------------------------- ?Emergency room visit for shortness of breath: CT angiogram: Segmental and subsegmental pulmonary emboli bilaterally ?Current treatment: ? ?

## 2021-08-27 ENCOUNTER — Encounter: Payer: Self-pay | Admitting: Hematology and Oncology

## 2021-08-27 ENCOUNTER — Telehealth: Payer: Self-pay

## 2021-08-27 NOTE — Telephone Encounter (Signed)
Appointment reminder. Left a voicemail reminding patient of her 2:00pm-08/28/21 in-person appointment w/ Shona Simpson PA-C. I advised patient to arrive 60mn early for check-in. I left my extension [phone] case patient needs anything.

## 2021-08-28 ENCOUNTER — Ambulatory Visit
Admission: RE | Admit: 2021-08-28 | Discharge: 2021-08-28 | Disposition: A | Discharge status: Home / Self Care | Payer: 59 | Source: Ambulatory Visit | Attending: Radiation Oncology | Admitting: Radiation Oncology

## 2021-08-28 ENCOUNTER — Encounter: Payer: Self-pay | Admitting: Radiation Oncology

## 2021-08-28 DIAGNOSIS — C50311 Malignant neoplasm of lower-inner quadrant of right female breast: Secondary | ICD-10-CM | POA: Diagnosis not present

## 2021-08-28 DIAGNOSIS — Z170 Estrogen receptor positive status [ER+]: Secondary | ICD-10-CM | POA: Diagnosis not present

## 2021-08-28 NOTE — Progress Notes (Addendum)
Radiation Oncology         [phone] ________________________________  Outpatient Follow Up - Conducted via telephone at patient request.  I spoke with the patient to conduct this consult visit via telephone to spare the patient unnecessary potential exposure in the healthcare setting during the current COVID-19 pandemic. The patient was notified in advance and was offered an in person or telemedicine meeting to allow for face to face communication but instead preferred to proceed with a telephone visit.    Name: Michelle Contreras        MRN: [id number]  Date of Service: 08/28/2021 DOB: 03/22/1961  [id number], Baldemar Friday., PA-C  Nicholas Lose, MD     REFERRING PHYSICIAN: Nicholas Lose, MD   DIAGNOSIS: The encounter diagnosis was Malignant neoplasm of lower-inner quadrant of right breast of female, estrogen receptor positive ([place]).   HISTORY OF PRESENT ILLNESS: Michelle Contreras is a 61 y.o. female originally seen in the multidisciplinary breast clinic for a new diagnosis of right breast cancer.  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 She developed a recurrence requiring BKA. She has also had [place] in the 1990s of the right breast s/p lumpectomy. Recently however, the patient was noted to have screening detected right breast abnormality and on further diagnostic work-up an irregular shaped mass in the lower inner quadrant was noted.  It could not be visualized by ultrasound and there was essentially no correlate there however her right axilla was negative for adenopathy.  She underwent a stereotactic biopsy on 05/29/2021 which showed an invasive, well-differentiated ductal carcinoma grade 1 with associated DCIS that was felt to be intermediate grade with solid and cribriform types but no necrosis.  Her cancer was ER/PR positive, HER2 negative with a Ki-67 of less than 5%.    Since her last visit, the patient has undergone  right lumpectomy with sentinel lymph node biopsy on 07/11/2021 with Dr. Marlou Starks.  Final pathology showed grade 2 invasive mucinous carcinoma arising in a papillary carcinoma with extensive DCIS that was intermediate grade.  Her overall tumor measured 2.7 cm and invasive and in situ disease was present within the inferior margin.  Additional inferior margin showed focal DCIS that was negative for invasive disease and the margin was 4 mm and considered negative.  A deep margin excision was also benign and 1 sentinel lymph node was negative for carcinoma.  Oncotype Dx score was 20 and no systemic therapy was offered. She met with plastic surgery and did undergo oncoplastic reconstruction of the right breast and mastopexy of the left.  This was performed on 07/22/2021.  Additional breast tissue from the right breast showed benign skin and her left mammoplasty specimen showed benign breast tissue with focal fibrocystic changes and no malignancy.  We received notification from Dr. Iran Planas that she is well-healed and ready to proceed with adjuvant therapy.  She is contacted today by phone to review the rationale for this.    PREVIOUS RADIATION THERAPY: Yes   1998: The Right lower leg received 35 treatments at [facility]. Details on dosing unavailable in [place] system.   PAST MEDICAL HISTORY:  Past Medical History:  Diagnosis Date   Allergy    Arthritis    neck   Breast cancer ([place]) 05/29/2021   DVT (deep venous thrombosis) ([facility])    upper thight - right leg   Headache    migraines   HSV (herpes simplex virus) anogenital  infection    Hx of   PONV (postoperative nausea and vomiting)    only with neck surgery.  No problems with any other surgery.   Sarcoma ([place])    Lower Extremitiy right leg       PAST SURGICAL HISTORY: Past Surgical History:  Procedure Laterality Date   ABDOMINAL HYSTERECTOMY     amputation of lower limb Right 2001   right leg, below knee   BREAST LUMPECTOMY WITH RADIOACTIVE  SEED AND SENTINEL LYMPH NODE BIOPSY Right 07/11/2021   Procedure: RIGHT BREAST LUMPECTOMY WITH RADIOACTIVE SEED AND SENTINEL LYMPH NODE BIOPSY;  Surgeon: Jovita Kussmaul, MD;  Location: [place];  Service: General;  Laterality: Right;   BREAST RECONSTRUCTION Right 07/22/2021   Procedure: BREAST RECONSTRUCTION;  Surgeon: Irene Limbo, MD;  Location: [place];  Service: Plastics;  Laterality: Right;   CHOLECYSTECTOMY  09/07/2011   Procedure: LAPAROSCOPIC CHOLECYSTECTOMY WITH INTRAOPERATIVE CHOLANGIOGRAM;  Surgeon: Haywood Lasso, MD;  Location: [place];  Service: General;  Laterality: N/A;   COLONOSCOPY  08/2010   polyps   LEG SURGERY Right 2017   LUMBAR LAMINECTOMY Left 07/17/2015   Procedure: MICRODISCECTOMY LUMBAR LAMINECTOMY  L4-L5 ON LEFT, CENTRAL DECOMPRESSION L4-L5 FOR SPINAL STENOSIS, FORAMINOTOMY L4-L5;  Surgeon: Latanya Maudlin, MD;  Location: [facility];  Service: Orthopedics;  Laterality: Left;   MASTOPEXY Left 07/22/2021   Procedure: MASTOPEXY;  Surgeon: Irene Limbo, MD;  Location: [place];  Service: Plastics;  Laterality: Left;   NECK SURGERY     fusion w/ plate   removal of benign growth to r breast       FAMILY HISTORY:  Family History  Problem Relation Age of Onset   Diabetes Mother    Kidney disease Mother        dialysis   Lung cancer Mother 45       lung   Kidney cancer Father 44   Breast cancer Father 6   Colon polyps Father        >10 precancerous colon polyps   Heart disease Father        ?   Other Sister        Skin syndrome   Colon polyps Brother    Throat cancer Brother    Other Paternal Aunt        Amyloidosis   Diabetes Maternal Grandfather    Colon cancer Other    Stomach cancer Neg Hx    Rectal cancer Neg Hx      SOCIAL HISTORY:  reports that she has never smoked. She has been exposed to tobacco smoke. She has never used smokeless tobacco. She reports current alcohol use of about 1.0  standard drink of alcohol per week. She reports that she does not use drugs.  The patient is married and lives in [place].  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    ALLERGIES: Adhesive [tape], Gabapentin, Tizanidine, Tramadol, Wound dressing adhesive, and Other   MEDICATIONS:  Current Outpatient Medications  Medication Sig Dispense Refill   apixaban (ELIQUIS) 5 MG TABS tablet To twice daily for 7 days then 1 twice daily 60 tablet 4   b complex vitamins capsule Take 1 capsule by mouth daily.       cetirizine (ZYRTEC) 10 MG tablet Take by mouth.     Cholecalciferol (VITAMIN D3) 10000 units TABS Take 1,000 Units by mouth daily.     estradiol (ESTRACE) 1 MG tablet Take 0.5  mg by mouth daily.     fluticasone (FLONASE) 50 MCG/ACT nasal spray Place into both nostrils daily.     levocetirizine (XYZAL) 5 MG tablet Take 2.5 mg by mouth every evening.     Multiple Vitamin (MULTIVITAMIN WITH MINERALS) TABS tablet Take 1 tablet by mouth daily.     oxyCODONE-acetaminophen (PERCOCET) 10-325 MG tablet Take 1 tablet by mouth every 6 (six) hours as needed for pain. 15 tablet 0   pregabalin (LYRICA) 75 MG capsule Take 75 mg by mouth. As needed     SUMAtriptan (IMITREX) 100 MG tablet Take 100 mg by mouth every 2 (two) hours as needed for migraine or headache.      tretinoin (RETIN-A) 0.05 % cream Apply 1 application topically at bedtime. Apply sparingly.     No current facility-administered medications for this encounter.     REVIEW OF SYSTEMS: On review of systems, the patient reports that she is doing well since her second surgery and she's satisfied in how things look and feel. No other breast complaints are noted.      PHYSICAL EXAM:  Unable to assess due to encounter type.   ECOG = 0  0 - Asymptomatic (Fully active, able to carry on all predisease activities without restriction)  1 - Symptomatic but completely ambulatory (Restricted in physically strenuous  activity but ambulatory and able to carry out work of a light or sedentary nature. For example, light housework, office work)  2 - Symptomatic, <50% in bed during the day (Ambulatory and capable of all self care but unable to carry out any work activities. Up and about more than 50% of waking hours)  3 - Symptomatic, >50% in bed, but not bedbound (Capable of only limited self-care, confined to bed or chair 50% or more of waking hours)  4 - Bedbound (Completely disabled. Cannot carry on any self-care. Totally confined to bed or chair)  5 - Death   Eustace Pen MM, Creech RH, Tormey DC, et al. [phone]). "Toxicity and response criteria of the [facility]". [place] Oncol. 5 (6): 649-55    LABORATORY DATA:  Lab Results  Component Value Date   WBC 6.8 07/31/2021   HGB 13.1 07/31/2021   HCT 39.6 07/31/2021   MCV 88.8 07/31/2021   PLT 268 07/31/2021   Lab Results  Component Value Date   NA 141 07/31/2021   K 4.4 07/31/2021   CL 107 07/31/2021   CO2 26 07/31/2021   Lab Results  Component Value Date   ALT 23 06/25/2015   AST 24 06/25/2015   ALKPHOS 47 06/25/2015   BILITOT 0.8 06/25/2015      RADIOGRAPHY: CT Angio Chest PE W and/or Wo Contrast  Result Date: 07/31/2021 CLINICAL DATA:  Pulmonary embolism suspected, high probability. Shortness of breath, recent surgery. EXAM: CT ANGIOGRAPHY CHEST WITH CONTRAST TECHNIQUE: Multidetector CT imaging of the chest was performed using the standard protocol during bolus administration of intravenous contrast. Multiplanar CT image reconstructions and MIPs were obtained to evaluate the vascular anatomy. RADIATION DOSE REDUCTION: This exam was performed according to the departmental dose-optimization program which includes automated exposure control, adjustment of the mA and/or kV according to patient size and/or use of iterative reconstruction technique. CONTRAST:  75m OMNIPAQUE IOHEXOL 350 MG/ML SOLN COMPARISON:  10/22/2017  FINDINGS: Cardiovascular: The heart is normal in size and there is no pericardial effusion. The aorta and pulmonary trunk are normal in caliber. There are small segmental and subsegmental pulmonary emboli in the  right upper lobe right lower lobe and right middle lobe and left lower lobe. No evidence of right heart strain. Mediastinum/Nodes: No mediastinal, hilar, or axillary lymphadenopathy. Surgical clips and fat stranding are noted in the right axilla which are likely postsurgical. The thyroid gland, trachea, and esophagus are within normal limits. Lungs/Pleura: Strandy atelectasis is noted bilaterally. No effusion or pneumothorax. Upper Abdomen: No acute abnormality. Musculoskeletal: Cervical spinal fusion hardware is noted. No acute or suspicious osseous abnormality. Review of the MIP images confirms the above findings. IMPRESSION: 1. Segmental and subsegmental pulmonary emboli bilaterally. No evidence of right heart strain. 2. Strandy atelectasis bilaterally. Critical findings were reported to Dr. Eulis Foster at 9:19 p.m. Electronically Signed   By: Brett Fairy M.D.   On: 07/31/2021 21:21   DG Chest 2 View  Result Date: 07/31/2021 CLINICAL DATA:  Chest pain shortness of breath EXAM: CHEST - 2 VIEW COMPARISON:  09/15/2017 FINDINGS: The heart size and mediastinal contours are within normal limits. Both lungs are clear. The visualized skeletal structures are unremarkable. IMPRESSION: No acute abnormality of the lungs. Electronically Signed   By: Delanna Ahmadi M.D.   On: 07/31/2021 15:38       IMPRESSION/PLAN: 1.  Stage IA, pT2N0M0, grade 2, ER/PR positive invasive ductal carcinoma of the right breast. Dr. Lisbeth Renshaw has reviewed her final pathology findings and I reviewed  the nature of right breast disease. She has done well since lumpectomy with sentinel node biopsy and her breast reduction. She does not need chemotherapy, and now she is ready to proceed with external radiotherapy to the breast  to reduce risks  of local recurrence followed by antiestrogen therapy. She is considering her options still and may be less likely to proceed with antiestrogen therapy and she plans to talk with Dr. Lindi Adie about this soon. We discussed the risks, benefits, short, and long term effects of radiotherapy, as well as the curative intent. I reviewed the delivery and logistics of radiotherapy and Dr. Lisbeth Renshaw recommends 4  weeks of radiotherapy to the right breast. She will come tomorrow to our clinic to proceed with [place].   This encounter was conducted via telephone.  The patient has provided two factor identification and has given verbal consent for this type of encounter and has been advised to only accept a meeting of this type in a secure network environment. The time spent during this encounter was 35 minutes including preparation, discussion, and coordination of the patient's care. The attendants for this meeting include Hayden Pedro  and Lyndee Leo.  During the encounter, Hayden Pedro was located at [facility] Radiation Oncology Department.  Michelle Contreras was located at home.     Carola Rhine, [facility]    **Disclaimer: This note was dictated with voice recognition software. Similar sounding words can inadvertently be transcribed and this note may contain transcription errors which may not have been corrected upon publication of note.**

## 2021-08-28 NOTE — Progress Notes (Signed)
Follow-up-new telephone appointment. I verified patient's identity and clarified that today's appointment is a TELEPHONE appointment only. Patient reports a dull pain 2/10 in RT breast. No other issues reported at this time.  Meaningful use complete. Hysterectomy- NO chances of pregnancy.  Reminded patient of her 2:30pm-08/28/21 telephone appointment w/ Shona Simpson PA-C. I left my extension [phone] in case patient needs anything. Patient verbalized understanding.  Patient contact [phone]

## 2021-08-29 ENCOUNTER — Other Ambulatory Visit: Payer: Self-pay

## 2021-08-29 ENCOUNTER — Ambulatory Visit
Admission: RE | Admit: 2021-08-29 | Discharge: 2021-08-29 | Disposition: A | Discharge status: Home / Self Care | Payer: 59 | Source: Ambulatory Visit | Attending: Radiation Oncology | Admitting: Radiation Oncology

## 2021-08-29 DIAGNOSIS — C50311 Malignant neoplasm of lower-inner quadrant of right female breast: Secondary | ICD-10-CM | POA: Insufficient documentation

## 2021-08-29 DIAGNOSIS — Z510 Encounter for antineoplastic radiation therapy: Secondary | ICD-10-CM | POA: Insufficient documentation

## 2021-08-29 DIAGNOSIS — Z170 Estrogen receptor positive status [ER+]: Secondary | ICD-10-CM | POA: Diagnosis not present

## 2021-09-02 ENCOUNTER — Telehealth: Payer: Self-pay | Admitting: Hematology and Oncology

## 2021-09-02 ENCOUNTER — Inpatient Hospital Stay: Payer: 59 | Attending: Radiation Oncology | Admitting: Hematology and Oncology

## 2021-09-02 DIAGNOSIS — Z170 Estrogen receptor positive status [ER+]: Secondary | ICD-10-CM | POA: Diagnosis not present

## 2021-09-02 DIAGNOSIS — C50311 Malignant neoplasm of lower-inner quadrant of right female breast: Secondary | ICD-10-CM | POA: Diagnosis not present

## 2021-09-02 NOTE — Telephone Encounter (Signed)
Scheduled appointment per 6/13 los. Patient is aware.

## 2021-09-02 NOTE — Assessment & Plan Note (Signed)
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 Adjuvant radiation therapy(she is still contemplating on the pros and cons of radiation): She is leaning onto proceeding with radiation 2. Adjuvant antiestrogen therapy(patient is not at all interested in antiestrogen therapy because of the perceived side effects of weight gain, vaginal dryness, hair loss, hot flashes and arthralgias)  ----------------------------------------------------------------------------------------------------------------------------------- 07/31/2021: Emergency room visit for shortness of breath: CT angiogram: Segmental and subsegmental pulmonary emboli bilaterally Current treatment: Eliquis for 6 months (will perform CT and can stop anticoagulation if there is no PE)  In 2019 she had extensive hypercoagulability work-up done at [facility] which was negative.  At that time she had a DVT after surgical procedure and she was treated with Xarelto for 3 months.  Patient does not want to receive antiestrogen therapy. Return to clinic in 4 months for survivorship care plan visit

## 2021-09-02 NOTE — Progress Notes (Signed)
HEMATOLOGY-ONCOLOGY TELEPHONE VISIT PROGRESS NOTE  I connected with [place] on 09/02/21 at 11:00 AM EDT by telephone and verified that I am speaking with the correct person using two identifiers.  I discussed the limitations, risks, security and privacy concerns of performing an evaluation and management service by telephone and the availability of in person appointments.  I also discussed with the patient that there may be a patient responsible charge related to this service. The patient expressed understanding and agreed to proceed.   History of Present Illness: Wanted to discuss radiation risks again  Oncology History  Malignant neoplasm of lower-inner quadrant of right breast of female, estrogen receptor positive ([place])  05/29/2021 Initial Diagnosis   Screening mammogram detected right breast abnormality.  No ultrasound correlate.  Stereotactic biopsy revealed well-differentiated ductal adenocarcinoma with extracellular mucin grade 1, intermediate grade DCIS, ER 95%, PR 95%, Ki-67 less than 5%, HER2 1+   06/17/2021 Cancer Staging   Staging form: Breast, AJCC 8th Edition - Clinical: Stage Unknown (cTX, cN0, cM0, G1, ER+, PR+, HER2-) - Signed by Nicholas Lose, MD on 06/17/2021 Stage prefix: Initial diagnosis Histologic grading system: 3 grade system    Genetic Testing   [facility] CancerNext-Expanded was Negative. Report date is 06/18/2021.  The CancerNext-Expanded gene panel offered by [facility] and includes sequencing, rearrangement, and RNA analysis for the following 77 genes: AIP, ALK, APC, ATM, AXIN2, BAP1, BARD1, BLM, BMPR1A, BRCA1, BRCA2, BRIP1, CDC73, CDH1, CDK4, CDKN1B, CDKN2A, CHEK2, CTNNA1, DICER1, FANCC, FH, FLCN, GALNT12, KIF1B, LZTR1, MAX, MEN1, MET, MLH1, MSH2, MSH3, MSH6, MUTYH, NBN, NF1, NF2, NTHL1, PALB2, PHOX2B, PMS2, POT1, PRKAR1A, PTCH1, PTEN, RAD51C, RAD51D, RB1, RECQL, RET, SDHA, SDHAF2, SDHB, SDHC, SDHD, SMAD4, SMARCA4, SMARCB1, SMARCE1, STK11, SUFU, TMEM127, TP53, TSC1,  TSC2, VHL and XRCC2 (sequencing and deletion/duplication); EGFR, EGLN1, HOXB13, KIT, MITF, PDGFRA, POLD1, and POLE (sequencing only); EPCAM and GREM1 (deletion/duplication only).    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 grade 2, 2.7 cm, extensive DCIS intermediate grade, i 0/1 lymph node negative, margins negative for invasive disease but positive for DCIS, ER 95%, PR 95%, HER2 negative by IHC 1+, Ki-67 less than 5%   07/22/2021 Oncotype testing   Oncotype score: 20, risk of distant recurrence at 9 years: 6%   08/28/2021 Cancer Staging   Staging form: Breast, AJCC 8th Edition - Pathologic stage from 08/28/2021: Stage IA (pT2, pN0(sn), cM0, G2, ER+, PR+, HER2-, Oncotype DX score: 20) - Signed by Hayden Pedro, PA-C on 08/28/2021 Method of lymph node assessment: Sentinel lymph node biopsy Multigene prognostic tests performed: Oncotype DX Recurrence score range: Greater than or equal to 11 Histologic grading system: 3 grade system     REVIEW OF SYSTEMS:   Constitutional: Denies fevers, chills or abnormal weight loss All other systems were reviewed with the patient and are negative. 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 Adjuvant radiation therapy proceeding with radiation (her biggest concern is sarcoma history) 2. Adjuvant antiestrogen therapy (patient is not  at all interested in antiestrogen  therapy because of the perceived side effects of weight gain, vaginal dryness, hair loss, hot flashes and arthralgias)  ----------------------------------------------------------------------------------------------------------------------------------- 07/31/2021: Emergency room visit for shortness of breath: CT angiogram: Segmental and subsegmental pulmonary emboli bilaterally Current treatment: Eliquis for 6 months (will perform CT and can stop anticoagulation if there is no PE)   In 2019 she had extensive hypercoagulability work-up done at [facility] which was negative.  At that time she had a DVT after surgical procedure and she was treated with Xarelto for 3 months.   Patient does not want to receive antiestrogen therapy. Return to clinic in 4 months for survivorship care plan visit     I discussed the assessment and treatment plan with the patient. The patient was provided an opportunity to ask questions and all were answered. The patient agreed with the plan and demonstrated an understanding of the instructions. The patient was advised to call back or seek an in-person evaluation if the symptoms worsen or if the condition fails to improve as anticipated.   I provided 12 minutes of non-face-to-face time during this encounter. Harriette Ohara, MD

## 2021-09-05 DIAGNOSIS — Z170 Estrogen receptor positive status [ER+]: Secondary | ICD-10-CM | POA: Diagnosis not present

## 2021-09-05 DIAGNOSIS — Z510 Encounter for antineoplastic radiation therapy: Secondary | ICD-10-CM | POA: Diagnosis not present

## 2021-09-05 DIAGNOSIS — C50311 Malignant neoplasm of lower-inner quadrant of right female breast: Secondary | ICD-10-CM | POA: Diagnosis not present

## 2021-09-08 ENCOUNTER — Encounter: Payer: Self-pay | Admitting: *Deleted

## 2021-09-11 ENCOUNTER — Other Ambulatory Visit: Payer: Self-pay

## 2021-09-11 ENCOUNTER — Ambulatory Visit
Admission: RE | Admit: 2021-09-11 | Discharge: 2021-09-11 | Disposition: A | Discharge status: Home / Self Care | Payer: 59 | Source: Ambulatory Visit | Attending: Radiation Oncology | Admitting: Radiation Oncology

## 2021-09-11 DIAGNOSIS — Z510 Encounter for antineoplastic radiation therapy: Secondary | ICD-10-CM | POA: Diagnosis not present

## 2021-09-11 DIAGNOSIS — C50311 Malignant neoplasm of lower-inner quadrant of right female breast: Secondary | ICD-10-CM | POA: Diagnosis not present

## 2021-09-11 DIAGNOSIS — Z170 Estrogen receptor positive status [ER+]: Secondary | ICD-10-CM | POA: Diagnosis not present

## 2021-09-11 LAB — RAD ONC ARIA SESSION SUMMARY
Course Elapsed Days: 0
Plan Fractions Treated to Date: 1
Plan Prescribed Dose Per Fraction: 2.66 Gy
Plan Total Fractions Prescribed: 16
Plan Total Prescribed Dose: 42.56 Gy
Reference Point Dosage Given to Date: 2.66 Gy
Reference Point Session Dosage Given: 2.66 Gy
Session Number: 1

## 2021-09-12 ENCOUNTER — Other Ambulatory Visit: Payer: Self-pay

## 2021-09-12 ENCOUNTER — Ambulatory Visit
Admission: RE | Admit: 2021-09-12 | Discharge: 2021-09-12 | Disposition: A | Discharge status: Home / Self Care | Payer: 59 | Source: Ambulatory Visit | Attending: Radiation Oncology | Admitting: Radiation Oncology

## 2021-09-12 DIAGNOSIS — Z170 Estrogen receptor positive status [ER+]: Secondary | ICD-10-CM | POA: Diagnosis not present

## 2021-09-12 DIAGNOSIS — C50311 Malignant neoplasm of lower-inner quadrant of right female breast: Secondary | ICD-10-CM | POA: Diagnosis not present

## 2021-09-12 DIAGNOSIS — Z510 Encounter for antineoplastic radiation therapy: Secondary | ICD-10-CM | POA: Diagnosis not present

## 2021-09-12 LAB — RAD ONC ARIA SESSION SUMMARY
Course Elapsed Days: 1
Plan Fractions Treated to Date: 2
Plan Prescribed Dose Per Fraction: 2.66 Gy
Plan Total Fractions Prescribed: 16
Plan Total Prescribed Dose: 42.56 Gy
Reference Point Dosage Given to Date: 5.32 Gy
Reference Point Session Dosage Given: 2.66 Gy
Session Number: 2

## 2021-09-15 ENCOUNTER — Ambulatory Visit: Payer: 59

## 2021-09-16 ENCOUNTER — Other Ambulatory Visit: Payer: Self-pay

## 2021-09-16 ENCOUNTER — Ambulatory Visit
Admission: RE | Admit: 2021-09-16 | Discharge: 2021-09-16 | Disposition: A | Discharge status: Home / Self Care | Payer: 59 | Source: Ambulatory Visit | Attending: Radiation Oncology | Admitting: Radiation Oncology

## 2021-09-16 DIAGNOSIS — Z170 Estrogen receptor positive status [ER+]: Secondary | ICD-10-CM | POA: Diagnosis not present

## 2021-09-16 DIAGNOSIS — C50311 Malignant neoplasm of lower-inner quadrant of right female breast: Secondary | ICD-10-CM | POA: Diagnosis not present

## 2021-09-16 DIAGNOSIS — Z510 Encounter for antineoplastic radiation therapy: Secondary | ICD-10-CM | POA: Diagnosis not present

## 2021-09-16 LAB — RAD ONC ARIA SESSION SUMMARY
Course Elapsed Days: 5
Plan Fractions Treated to Date: 3
Plan Prescribed Dose Per Fraction: 2.66 Gy
Plan Total Fractions Prescribed: 16
Plan Total Prescribed Dose: 42.56 Gy
Reference Point Dosage Given to Date: 7.98 Gy
Reference Point Session Dosage Given: 2.66 Gy
Session Number: 3

## 2021-09-17 ENCOUNTER — Other Ambulatory Visit: Payer: Self-pay

## 2021-09-17 ENCOUNTER — Ambulatory Visit
Admission: RE | Admit: 2021-09-17 | Discharge: 2021-09-17 | Disposition: A | Discharge status: Home / Self Care | Payer: 59 | Source: Ambulatory Visit | Attending: Radiation Oncology | Admitting: Radiation Oncology

## 2021-09-17 DIAGNOSIS — L539 Erythematous condition, unspecified: Secondary | ICD-10-CM | POA: Diagnosis not present

## 2021-09-17 DIAGNOSIS — C50311 Malignant neoplasm of lower-inner quadrant of right female breast: Secondary | ICD-10-CM | POA: Diagnosis not present

## 2021-09-17 DIAGNOSIS — Z170 Estrogen receptor positive status [ER+]: Secondary | ICD-10-CM | POA: Diagnosis not present

## 2021-09-17 DIAGNOSIS — Z510 Encounter for antineoplastic radiation therapy: Secondary | ICD-10-CM | POA: Diagnosis not present

## 2021-09-17 DIAGNOSIS — Z89511 Acquired absence of right leg below knee: Secondary | ICD-10-CM | POA: Diagnosis not present

## 2021-09-17 LAB — RAD ONC ARIA SESSION SUMMARY
Course Elapsed Days: 6
Plan Fractions Treated to Date: 4
Plan Prescribed Dose Per Fraction: 2.66 Gy
Plan Total Fractions Prescribed: 16
Plan Total Prescribed Dose: 42.56 Gy
Reference Point Dosage Given to Date: 10.64 Gy
Reference Point Session Dosage Given: 2.66 Gy
Session Number: 4

## 2021-09-18 ENCOUNTER — Encounter (HOSPITAL_BASED_OUTPATIENT_CLINIC_OR_DEPARTMENT_OTHER): Payer: Self-pay | Admitting: Plastic Surgery

## 2021-09-18 ENCOUNTER — Other Ambulatory Visit: Payer: Self-pay

## 2021-09-18 ENCOUNTER — Ambulatory Visit
Admission: RE | Admit: 2021-09-18 | Discharge: 2021-09-18 | Disposition: A | Discharge status: Home / Self Care | Payer: 59 | Source: Ambulatory Visit | Attending: Radiation Oncology | Admitting: Radiation Oncology

## 2021-09-18 DIAGNOSIS — Z170 Estrogen receptor positive status [ER+]: Secondary | ICD-10-CM | POA: Diagnosis not present

## 2021-09-18 DIAGNOSIS — C50311 Malignant neoplasm of lower-inner quadrant of right female breast: Secondary | ICD-10-CM | POA: Diagnosis not present

## 2021-09-18 DIAGNOSIS — Z510 Encounter for antineoplastic radiation therapy: Secondary | ICD-10-CM | POA: Diagnosis not present

## 2021-09-18 LAB — RAD ONC ARIA SESSION SUMMARY
Course Elapsed Days: 7
Plan Fractions Treated to Date: 5
Plan Prescribed Dose Per Fraction: 2.66 Gy
Plan Total Fractions Prescribed: 16
Plan Total Prescribed Dose: 42.56 Gy
Reference Point Dosage Given to Date: 13.3 Gy
Reference Point Session Dosage Given: 2.66 Gy
Session Number: 5

## 2021-09-19 ENCOUNTER — Ambulatory Visit
Admission: RE | Admit: 2021-09-19 | Discharge: 2021-09-19 | Disposition: A | Discharge status: Home / Self Care | Payer: 59 | Source: Ambulatory Visit | Attending: Radiation Oncology | Admitting: Radiation Oncology

## 2021-09-19 ENCOUNTER — Other Ambulatory Visit: Payer: Self-pay

## 2021-09-19 DIAGNOSIS — Z170 Estrogen receptor positive status [ER+]: Secondary | ICD-10-CM | POA: Diagnosis not present

## 2021-09-19 DIAGNOSIS — Z510 Encounter for antineoplastic radiation therapy: Secondary | ICD-10-CM | POA: Diagnosis not present

## 2021-09-19 DIAGNOSIS — C50311 Malignant neoplasm of lower-inner quadrant of right female breast: Secondary | ICD-10-CM | POA: Diagnosis not present

## 2021-09-19 LAB — RAD ONC ARIA SESSION SUMMARY
Course Elapsed Days: 8
Plan Fractions Treated to Date: 6
Plan Prescribed Dose Per Fraction: 2.66 Gy
Plan Total Fractions Prescribed: 16
Plan Total Prescribed Dose: 42.56 Gy
Reference Point Dosage Given to Date: 15.96 Gy
Reference Point Session Dosage Given: 2.66 Gy
Session Number: 6

## 2021-09-22 ENCOUNTER — Other Ambulatory Visit: Payer: Self-pay

## 2021-09-22 ENCOUNTER — Ambulatory Visit
Admission: RE | Admit: 2021-09-22 | Discharge: 2021-09-22 | Disposition: A | Discharge status: Home / Self Care | Payer: 59 | Source: Ambulatory Visit | Attending: Radiation Oncology | Admitting: Radiation Oncology

## 2021-09-22 DIAGNOSIS — Z170 Estrogen receptor positive status [ER+]: Secondary | ICD-10-CM | POA: Diagnosis not present

## 2021-09-22 DIAGNOSIS — C50311 Malignant neoplasm of lower-inner quadrant of right female breast: Secondary | ICD-10-CM | POA: Diagnosis not present

## 2021-09-22 DIAGNOSIS — Z510 Encounter for antineoplastic radiation therapy: Secondary | ICD-10-CM | POA: Insufficient documentation

## 2021-09-22 LAB — RAD ONC ARIA SESSION SUMMARY
Course Elapsed Days: 11
Plan Fractions Treated to Date: 7
Plan Prescribed Dose Per Fraction: 2.66 Gy
Plan Total Fractions Prescribed: 16
Plan Total Prescribed Dose: 42.56 Gy
Reference Point Dosage Given to Date: 18.62 Gy
Reference Point Session Dosage Given: 2.66 Gy
Session Number: 7

## 2021-09-24 ENCOUNTER — Ambulatory Visit
Admission: RE | Admit: 2021-09-24 | Discharge: 2021-09-24 | Disposition: A | Discharge status: Home / Self Care | Payer: 59 | Source: Ambulatory Visit | Attending: Radiation Oncology | Admitting: Radiation Oncology

## 2021-09-24 ENCOUNTER — Other Ambulatory Visit: Payer: Self-pay

## 2021-09-24 DIAGNOSIS — C50311 Malignant neoplasm of lower-inner quadrant of right female breast: Secondary | ICD-10-CM | POA: Diagnosis not present

## 2021-09-24 DIAGNOSIS — Z510 Encounter for antineoplastic radiation therapy: Secondary | ICD-10-CM | POA: Diagnosis not present

## 2021-09-24 DIAGNOSIS — Z170 Estrogen receptor positive status [ER+]: Secondary | ICD-10-CM | POA: Diagnosis not present

## 2021-09-24 LAB — RAD ONC ARIA SESSION SUMMARY
Course Elapsed Days: 13
Plan Fractions Treated to Date: 8
Plan Prescribed Dose Per Fraction: 2.66 Gy
Plan Total Fractions Prescribed: 16
Plan Total Prescribed Dose: 42.56 Gy
Reference Point Dosage Given to Date: 21.28 Gy
Reference Point Session Dosage Given: 2.66 Gy
Session Number: 8

## 2021-09-25 ENCOUNTER — Other Ambulatory Visit: Payer: Self-pay

## 2021-09-25 ENCOUNTER — Ambulatory Visit
Admission: RE | Admit: 2021-09-25 | Discharge: 2021-09-25 | Disposition: A | Discharge status: Home / Self Care | Payer: 59 | Source: Ambulatory Visit | Attending: Radiation Oncology | Admitting: Radiation Oncology

## 2021-09-25 DIAGNOSIS — Z510 Encounter for antineoplastic radiation therapy: Secondary | ICD-10-CM | POA: Diagnosis not present

## 2021-09-25 DIAGNOSIS — C50311 Malignant neoplasm of lower-inner quadrant of right female breast: Secondary | ICD-10-CM | POA: Diagnosis not present

## 2021-09-25 DIAGNOSIS — Z170 Estrogen receptor positive status [ER+]: Secondary | ICD-10-CM | POA: Diagnosis not present

## 2021-09-25 LAB — RAD ONC ARIA SESSION SUMMARY
Course Elapsed Days: 14
Plan Fractions Treated to Date: 9
Plan Prescribed Dose Per Fraction: 2.66 Gy
Plan Total Fractions Prescribed: 16
Plan Total Prescribed Dose: 42.56 Gy
Reference Point Dosage Given to Date: 23.94 Gy
Reference Point Session Dosage Given: 2.66 Gy
Session Number: 9

## 2021-09-26 ENCOUNTER — Ambulatory Visit: Payer: 59

## 2021-09-29 ENCOUNTER — Other Ambulatory Visit: Payer: Self-pay

## 2021-09-29 ENCOUNTER — Ambulatory Visit
Admission: RE | Admit: 2021-09-29 | Discharge: 2021-09-29 | Disposition: A | Discharge status: Home / Self Care | Payer: 59 | Source: Ambulatory Visit | Attending: Radiation Oncology | Admitting: Radiation Oncology

## 2021-09-29 ENCOUNTER — Inpatient Hospital Stay: Payer: 59 | Attending: Radiation Oncology | Admitting: Hematology and Oncology

## 2021-09-29 DIAGNOSIS — Z510 Encounter for antineoplastic radiation therapy: Secondary | ICD-10-CM | POA: Diagnosis not present

## 2021-09-29 DIAGNOSIS — C50311 Malignant neoplasm of lower-inner quadrant of right female breast: Secondary | ICD-10-CM

## 2021-09-29 DIAGNOSIS — Z170 Estrogen receptor positive status [ER+]: Secondary | ICD-10-CM | POA: Diagnosis not present

## 2021-09-29 LAB — RAD ONC ARIA SESSION SUMMARY
Course Elapsed Days: 18
Plan Fractions Treated to Date: 10
Plan Prescribed Dose Per Fraction: 2.66 Gy
Plan Total Fractions Prescribed: 16
Plan Total Prescribed Dose: 42.56 Gy
Reference Point Dosage Given to Date: 26.6 Gy
Reference Point Session Dosage Given: 2.66 Gy
Session Number: 10

## 2021-09-29 NOTE — Assessment & Plan Note (Addendum)
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 Adjuvant antiestrogen therapy(patient is not at all interested in antiestrogen therapy because of the perceived side effects of weight gain, vaginal dryness, hair loss, hot flashes and arthralgias) ----------------------------------------------------------------------------------------------------------------------------------- 07/31/2021: Emergency room visit for shortness of breath: CT angiogram: Segmental and subsegmental pulmonary emboli bilaterally  Current treatment:Eliquis for 6 months (will perform CT and can stop anticoagulation if there is no PE)  In 2019 she had extensive hypercoagulability work-up done at [facility] which was negative. At that time she had a DVT after surgical procedure and she was treated with Xarelto for 3 months.  Patient does not want to receive antiestrogen therapy. Return to clinic in 3 months for survivorship care plan visit

## 2021-09-29 NOTE — Progress Notes (Signed)
This encounter was created in error - please disregard. The appointment was made by mistake.

## 2021-09-30 ENCOUNTER — Ambulatory Visit
Admission: RE | Admit: 2021-09-30 | Discharge: 2021-09-30 | Disposition: A | Discharge status: Home / Self Care | Payer: 59 | Source: Ambulatory Visit | Attending: Radiation Oncology | Admitting: Radiation Oncology

## 2021-09-30 ENCOUNTER — Other Ambulatory Visit: Payer: Self-pay

## 2021-09-30 DIAGNOSIS — Z510 Encounter for antineoplastic radiation therapy: Secondary | ICD-10-CM | POA: Diagnosis not present

## 2021-09-30 DIAGNOSIS — C50311 Malignant neoplasm of lower-inner quadrant of right female breast: Secondary | ICD-10-CM | POA: Diagnosis not present

## 2021-09-30 DIAGNOSIS — Z170 Estrogen receptor positive status [ER+]: Secondary | ICD-10-CM | POA: Diagnosis not present

## 2021-09-30 LAB — RAD ONC ARIA SESSION SUMMARY
Course Elapsed Days: 19
Plan Fractions Treated to Date: 11
Plan Prescribed Dose Per Fraction: 2.66 Gy
Plan Total Fractions Prescribed: 16
Plan Total Prescribed Dose: 42.56 Gy
Reference Point Dosage Given to Date: 29.26 Gy
Reference Point Session Dosage Given: 2.66 Gy
Session Number: 11

## 2021-10-01 ENCOUNTER — Other Ambulatory Visit: Payer: Self-pay

## 2021-10-01 ENCOUNTER — Ambulatory Visit
Admission: RE | Admit: 2021-10-01 | Discharge: 2021-10-01 | Disposition: A | Discharge status: Home / Self Care | Payer: 59 | Source: Ambulatory Visit | Attending: Radiation Oncology | Admitting: Radiation Oncology

## 2021-10-01 DIAGNOSIS — Z170 Estrogen receptor positive status [ER+]: Secondary | ICD-10-CM | POA: Diagnosis not present

## 2021-10-01 DIAGNOSIS — Z510 Encounter for antineoplastic radiation therapy: Secondary | ICD-10-CM | POA: Diagnosis not present

## 2021-10-01 DIAGNOSIS — C50311 Malignant neoplasm of lower-inner quadrant of right female breast: Secondary | ICD-10-CM | POA: Diagnosis not present

## 2021-10-01 LAB — RAD ONC ARIA SESSION SUMMARY
Course Elapsed Days: 20
Plan Fractions Treated to Date: 12
Plan Prescribed Dose Per Fraction: 2.66 Gy
Plan Total Fractions Prescribed: 16
Plan Total Prescribed Dose: 42.56 Gy
Reference Point Dosage Given to Date: 31.92 Gy
Reference Point Session Dosage Given: 2.66 Gy
Session Number: 12

## 2021-10-02 ENCOUNTER — Other Ambulatory Visit: Payer: Self-pay

## 2021-10-02 ENCOUNTER — Ambulatory Visit
Admission: RE | Admit: 2021-10-02 | Discharge: 2021-10-02 | Disposition: A | Discharge status: Home / Self Care | Payer: 59 | Source: Ambulatory Visit | Attending: Radiation Oncology | Admitting: Radiation Oncology

## 2021-10-02 DIAGNOSIS — Z170 Estrogen receptor positive status [ER+]: Secondary | ICD-10-CM | POA: Diagnosis not present

## 2021-10-02 DIAGNOSIS — C50311 Malignant neoplasm of lower-inner quadrant of right female breast: Secondary | ICD-10-CM | POA: Diagnosis not present

## 2021-10-02 DIAGNOSIS — Z510 Encounter for antineoplastic radiation therapy: Secondary | ICD-10-CM | POA: Diagnosis not present

## 2021-10-02 LAB — RAD ONC ARIA SESSION SUMMARY
Course Elapsed Days: 21
Plan Fractions Treated to Date: 13
Plan Prescribed Dose Per Fraction: 2.66 Gy
Plan Total Fractions Prescribed: 16
Plan Total Prescribed Dose: 42.56 Gy
Reference Point Dosage Given to Date: 34.58 Gy
Reference Point Session Dosage Given: 2.66 Gy
Session Number: 13

## 2021-10-03 ENCOUNTER — Ambulatory Visit
Admission: RE | Admit: 2021-10-03 | Discharge: 2021-10-03 | Disposition: A | Discharge status: Home / Self Care | Payer: 59 | Source: Ambulatory Visit | Attending: Radiation Oncology | Admitting: Radiation Oncology

## 2021-10-03 ENCOUNTER — Ambulatory Visit: Payer: 59 | Admitting: Radiation Oncology

## 2021-10-03 ENCOUNTER — Other Ambulatory Visit: Payer: Self-pay

## 2021-10-03 DIAGNOSIS — Z510 Encounter for antineoplastic radiation therapy: Secondary | ICD-10-CM | POA: Diagnosis not present

## 2021-10-03 DIAGNOSIS — Z170 Estrogen receptor positive status [ER+]: Secondary | ICD-10-CM | POA: Diagnosis not present

## 2021-10-03 DIAGNOSIS — C50311 Malignant neoplasm of lower-inner quadrant of right female breast: Secondary | ICD-10-CM | POA: Diagnosis not present

## 2021-10-03 LAB — RAD ONC ARIA SESSION SUMMARY
Course Elapsed Days: 22
Plan Fractions Treated to Date: 14
Plan Prescribed Dose Per Fraction: 2.66 Gy
Plan Total Fractions Prescribed: 16
Plan Total Prescribed Dose: 42.56 Gy
Reference Point Dosage Given to Date: 37.24 Gy
Reference Point Session Dosage Given: 2.66 Gy
Session Number: 14

## 2021-10-06 ENCOUNTER — Other Ambulatory Visit: Payer: Self-pay

## 2021-10-06 ENCOUNTER — Ambulatory Visit
Admission: RE | Admit: 2021-10-06 | Discharge: 2021-10-06 | Disposition: A | Discharge status: Home / Self Care | Payer: 59 | Source: Ambulatory Visit | Attending: Radiation Oncology | Admitting: Radiation Oncology

## 2021-10-06 ENCOUNTER — Ambulatory Visit: Payer: 59

## 2021-10-06 DIAGNOSIS — C50311 Malignant neoplasm of lower-inner quadrant of right female breast: Secondary | ICD-10-CM | POA: Diagnosis not present

## 2021-10-06 DIAGNOSIS — Z170 Estrogen receptor positive status [ER+]: Secondary | ICD-10-CM | POA: Diagnosis not present

## 2021-10-06 DIAGNOSIS — Z510 Encounter for antineoplastic radiation therapy: Secondary | ICD-10-CM | POA: Diagnosis not present

## 2021-10-06 LAB — RAD ONC ARIA SESSION SUMMARY
Course Elapsed Days: 25
Plan Fractions Treated to Date: 15
Plan Prescribed Dose Per Fraction: 2.66 Gy
Plan Total Fractions Prescribed: 16
Plan Total Prescribed Dose: 42.56 Gy
Reference Point Dosage Given to Date: 39.9 Gy
Reference Point Session Dosage Given: 2.66 Gy
Session Number: 15

## 2021-10-07 ENCOUNTER — Ambulatory Visit
Admission: RE | Admit: 2021-10-07 | Discharge: 2021-10-07 | Disposition: A | Discharge status: Home / Self Care | Payer: 59 | Source: Ambulatory Visit | Attending: Radiation Oncology | Admitting: Radiation Oncology

## 2021-10-07 ENCOUNTER — Other Ambulatory Visit: Payer: Self-pay

## 2021-10-07 DIAGNOSIS — Z170 Estrogen receptor positive status [ER+]: Secondary | ICD-10-CM | POA: Diagnosis not present

## 2021-10-07 DIAGNOSIS — C50311 Malignant neoplasm of lower-inner quadrant of right female breast: Secondary | ICD-10-CM | POA: Diagnosis not present

## 2021-10-07 DIAGNOSIS — Z510 Encounter for antineoplastic radiation therapy: Secondary | ICD-10-CM | POA: Diagnosis not present

## 2021-10-07 LAB — RAD ONC ARIA SESSION SUMMARY
Course Elapsed Days: 26
Plan Fractions Treated to Date: 16
Plan Prescribed Dose Per Fraction: 2.66 Gy
Plan Total Fractions Prescribed: 16
Plan Total Prescribed Dose: 42.56 Gy
Reference Point Dosage Given to Date: 42.56 Gy
Reference Point Session Dosage Given: 2.66 Gy
Session Number: 16

## 2021-10-08 ENCOUNTER — Ambulatory Visit
Admission: RE | Admit: 2021-10-08 | Discharge: 2021-10-08 | Disposition: A | Discharge status: Home / Self Care | Payer: 59 | Source: Ambulatory Visit | Attending: Radiation Oncology | Admitting: Radiation Oncology

## 2021-10-08 ENCOUNTER — Ambulatory Visit: Payer: 59

## 2021-10-08 ENCOUNTER — Other Ambulatory Visit: Payer: Self-pay

## 2021-10-08 DIAGNOSIS — C50311 Malignant neoplasm of lower-inner quadrant of right female breast: Secondary | ICD-10-CM | POA: Diagnosis not present

## 2021-10-08 DIAGNOSIS — Z170 Estrogen receptor positive status [ER+]: Secondary | ICD-10-CM | POA: Diagnosis not present

## 2021-10-08 DIAGNOSIS — Z510 Encounter for antineoplastic radiation therapy: Secondary | ICD-10-CM | POA: Diagnosis not present

## 2021-10-08 LAB — RAD ONC ARIA SESSION SUMMARY
Course Elapsed Days: 27
Plan Fractions Treated to Date: 1
Plan Prescribed Dose Per Fraction: 2 Gy
Plan Total Fractions Prescribed: 4
Plan Total Prescribed Dose: 8 Gy
Reference Point Dosage Given to Date: 44.56 Gy
Reference Point Session Dosage Given: 2 Gy
Session Number: 17

## 2021-10-09 ENCOUNTER — Ambulatory Visit
Admission: RE | Admit: 2021-10-09 | Discharge: 2021-10-09 | Disposition: A | Discharge status: Home / Self Care | Payer: 59 | Source: Ambulatory Visit | Attending: Radiation Oncology | Admitting: Radiation Oncology

## 2021-10-09 ENCOUNTER — Other Ambulatory Visit: Payer: Self-pay | Admitting: Radiation Oncology

## 2021-10-09 ENCOUNTER — Other Ambulatory Visit: Payer: Self-pay

## 2021-10-09 ENCOUNTER — Ambulatory Visit: Payer: 59

## 2021-10-09 DIAGNOSIS — C50311 Malignant neoplasm of lower-inner quadrant of right female breast: Secondary | ICD-10-CM | POA: Diagnosis not present

## 2021-10-09 DIAGNOSIS — Z510 Encounter for antineoplastic radiation therapy: Secondary | ICD-10-CM | POA: Diagnosis not present

## 2021-10-09 DIAGNOSIS — Z170 Estrogen receptor positive status [ER+]: Secondary | ICD-10-CM | POA: Diagnosis not present

## 2021-10-09 LAB — RAD ONC ARIA SESSION SUMMARY
Course Elapsed Days: 28
Plan Fractions Treated to Date: 2
Plan Prescribed Dose Per Fraction: 2 Gy
Plan Total Fractions Prescribed: 4
Plan Total Prescribed Dose: 8 Gy
Reference Point Dosage Given to Date: 46.56 Gy
Reference Point Session Dosage Given: 2 Gy
Session Number: 18

## 2021-10-09 MED ORDER — HYDROCODONE-ACETAMINOPHEN 5-325 MG PO TABS: 1.0000 | ORAL_TABLET | Freq: Four times a day (QID) | ORAL | 0 refills | Status: DC | PRN

## 2021-10-09 NOTE — Progress Notes (Signed)
Michelle Contreras is a pleasant 61 y.o.o woman with a diagnosis of Stage IA, pT2N0M0, grade 2, ER/PR positive invasive ductal carcinoma of the right breast who is currently receiving whole breast radiation. She has 2 more treatments to complete, and in the last two days or so she's developed progressive feeling of fullness of her right arm and numbness that is quite painful. She has to lay on her right side at night due to her RLE amputation and finds that it's been very difficult to get comfortable and get rest. She has not been seen by PT since surgery. She is examined and has radiation dermatitis of her right breast. No fullness of her surgical incision site in the axilla, and her keyhole incision site from her breast reduction is intact with mild induration. There is possible palpable cording along the lower biceps region of her right arm but no pitting edema is otherwise noted. Visual inspection though seems to identify a bit of swelling of her hand on the right, but again no pitting edema is noted when comparing the contralateral upper extremity. I recommended eval with PT and I sent in a new prescription for pain medication that she took postoperatively as well. We will message PT to see if she can be seen tomorrow.     Carola Rhine, PAC

## 2021-10-09 NOTE — Therapy (Signed)
OUTPATIENT PHYSICAL THERAPY BREAST CANCER EVALUATION   Patient Name: Michelle Contreras MRN: [id number] DOB:[birth year], 61 y.o., female Today's Date: 10/10/2021   PT End of Session - 10/10/21 1154     Visit Number 1    Number of Visits 9    Date for PT Re-Evaluation 11/21/21    PT Start Time 1100    PT Stop Time 1153    PT Time Calculation (min) 53 min    Activity Tolerance Patient tolerated treatment well    Behavior During Therapy [facility] for tasks assessed/performed              Past Medical History:  Diagnosis Date   Allergy    Arthritis    neck   Breast cancer ([place]) 05/29/2021   DVT (deep venous thrombosis) ([facility])    upper thight - right leg   Headache    migraines   HSV (herpes simplex virus) anogenital infection    Hx of   PONV (postoperative nausea and vomiting)    only with neck surgery.  No problems with any other surgery.   Sarcoma ([place])    Lower Extremitiy right leg   Past Surgical History:  Procedure Laterality Date   ABDOMINAL HYSTERECTOMY     amputation of lower limb Right 2001   right leg, below knee   BREAST LUMPECTOMY WITH RADIOACTIVE SEED AND SENTINEL LYMPH NODE BIOPSY Right 07/11/2021   Procedure: RIGHT BREAST LUMPECTOMY WITH RADIOACTIVE SEED AND SENTINEL LYMPH NODE BIOPSY;  Surgeon: Jovita Kussmaul, MD;  Location: [place];  Service: General;  Laterality: Right;   BREAST RECONSTRUCTION Right 07/22/2021   Procedure: BREAST RECONSTRUCTION;  Surgeon: Irene Limbo, MD;  Location: [place];  Service: Plastics;  Laterality: Right;   CHOLECYSTECTOMY  09/07/2011   Procedure: LAPAROSCOPIC CHOLECYSTECTOMY WITH INTRAOPERATIVE CHOLANGIOGRAM;  Surgeon: Haywood Lasso, MD;  Location: [place];  Service: General;  Laterality: N/A;   COLONOSCOPY  08/2010   polyps   LEG SURGERY Right 2017   LUMBAR LAMINECTOMY Left 07/17/2015   Procedure: MICRODISCECTOMY LUMBAR LAMINECTOMY  L4-L5 ON LEFT, CENTRAL DECOMPRESSION L4-L5 FOR SPINAL  STENOSIS, FORAMINOTOMY L4-L5;  Surgeon: Latanya Maudlin, MD;  Location: [facility];  Service: Orthopedics;  Laterality: Left;   MASTOPEXY Left 07/22/2021   Procedure: MASTOPEXY;  Surgeon: Irene Limbo, MD;  Location: [place];  Service: Plastics;  Laterality: Left;   NECK SURGERY     fusion w/ plate   removal of benign growth to r breast     Patient Active Problem List   Diagnosis Date Noted   Genetic testing 06/17/2021   Malignant neoplasm of lower-inner quadrant of right breast of female, estrogen receptor positive ([place]) 06/05/2021   Allergic rhinitis 11/01/2017   Spinal stenosis, lumbar region, with neurogenic claudication 07/17/2015   Renal cyst 09/22/2011   Acute cholecystitis 09/07/2011   Family history of malignant neoplasm of gastrointestinal tract 08/14/2010    PCP: Aletha Halim., PA-C  REFERRING PROVIDER: Hayden Pedro,*  REFERRING DIAG: Right breast Cancer  THERAPY DIAG:  Abnormal posture  Malignant neoplasm of lower-inner quadrant of right breast of female, estrogen receptor positive [facility])  Aftercare following surgery for neoplasm  ONSET DATE: 05/29/2021  SUBJECTIVE  SUBJECTIVE STATEMENT: I am in my boost now during radiation.  I woke up with pain down my arm and it is feeling tight.  It is still there by not as severe. Maybe a little bit swollen.  My skin is not doing very well. Today it is more tingly but overall much better  PERTINENT HISTORY:  Rt lumpectomy due to 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 1 negative lymph node. ER/PR positive, HER2 negative. Currently finishing up radiation.  She has a prior history of sarcoma of the leg for which she underwent resection and radiation but then 3 years later it relapsed with bone involvement and  therefore she required amputation of the leg. In 2002 she had a right ductectomy so she will have bilateral breast reconstruction in a week to a week and a half after her initial surgery.  PATIENT GOALS   decrease arm pain  PAIN:  Are you having pain? 2/10 yesterday it was a 5/10  - like its asleep   PRECAUTIONS: Rt lymphedema risk ,Other: She has a prior history of malignant fibrous histiocytoma of the leg for which she underwent resection and radiation but then 3 years later it relapsed with bone involvement and therefore she required amputation of the leg, prior back surgery, history of DVT  HAND DOMINANCE: right  WEIGHT BEARING RESTRICTIONS No  FALLS:  Has patient fallen in last 6 months? 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, Technical sales engineer, swimming  PRIOR LEVEL OF FUNCTION: Independent   OBJECTIVE  COGNITION:  Overall cognitive status: Within functional limits for tasks assessed    POSTURE:  Forward head and rounded shoulders posture  OBSERVATION/PALPATION: No cording noted in the arm, axilla is moderately puffy compared to the opposite side, radiation dermatitis evident  UPPER EXTREMITY AROM/PROM:  A/PROM RIGHT  06/18/21   Shoulder extension 72  Shoulder flexion 164  Shoulder abduction 180  Shoulder internal rotation 55  Shoulder external rotation 105    (Blank rows = not tested)  A/PROM LEFT  06/18/21  Shoulder extension 65  Shoulder flexion 156  Shoulder abduction 180  Shoulder internal rotation 50  Shoulder external rotation 92    (Blank rows = not tested)  LYMPHEDEMA ASSESSMENTS:   [facility] RIGHT  06/18/21 10/10/21  10 cm proximal to olecranon process 32.5 34.1  Olecranon process 28.0 29  10 cm proximal to ulnar styloid process 24.2 22.7  Just proximal to ulnar styloid process 17.9 18.6  Across hand  at thumb web space 21.8 21.4  At base of 2nd digit 6.8 6.8  (Blank rows = not tested)  [facility] LEFT  06/18/21 10/10/21  10 cm proximal to olecranon process 32.9 34  Olecranon process 28.2 29.5  10 cm proximal to ulnar styloid process 23.2 22.8  Just proximal to ulnar styloid process 17.7 18  Across hand at thumb web space 21.1 21  At base of 2nd digit 6.7 7.0  (Blank rows = not tested)   L-DEX LYMPHEDEMA SCREENING: Unable secondary to prosthetic limb (right leg)   QUICK DASH SURVEY 29%  TODAY"S TREATMENT   Date: 10/10/21  In supine: Clavicular and sternal nodes, 5 diaphragmatic breaths,bil axillary nodes and establishment of interaxillary pathway, R inguinal nodes and establishment of axilloinguinal pathway, then R UE working proximal to distal, moving inner upper arm outwards and upwards, and doing both sides of forearms, then retracing all steps.  Then in left sidelying posterior interaxillary work.  Education for patient on how to perform each step, reasons for MLD, and anatomy and physiology.  Pt performed each step in supine with cueing as needed.   Gave pt new axillary pillow and instruction on using this and/or elevation   PATIENT EDUCATION:  Education details: Lymphedema risk reduction and post op shoulder/posture HEP, self MLD Rt UE, POC Person educated: Patient Education method: Explanation, Demonstration, Handout Education comprehension: Patient verbalized understanding and returned demonstration   HOME EXERCISE PROGRAM: Self MLD for the Rt UE - per instruction section  GARMENTS: as of 10/10/21 pt wanted to wait on getting a sleeve.    ASSESSMENT:  CLINICAL IMPRESSION: Pt presents with new onset of Rt UE pain and tingling after starting a new position in radiation for the boost treatment.  Pt had a very painful and tingly day and night initially but now it is more a mild pain and tingle.  Bryson Ha thought she noted cording in the UE but today this was not noticed with  supine assessment of the UE and comparison to the Lt UE.  Circumferential measurements were equal and no landmarks seemed changed with visual observation but the axilla and anterior chest are congested due to active inflammation.  Pt seems to be having nerve related congestion in the axilla causing sensation disturbance in the UE.  Pt also notes hardness in the breast so she most likely is starting with some breast lymphedema.  Pt noted improvements most MLD today.    PT treatment/interventions: ADL/self-care home management, pt/family education, therapeutic exercise, manual therapy, taping  REHAB POTENTIAL: Excellent  CLINICAL DECISION MAKING: Changing  EVALUATION COMPLEXITY: Moderate   GOALS: Goals reviewed with patient? YES  LONG TERM GOALS: (STG=LTG)   Name Target Date Goal status  1 Pt will decrease Rt UE tingling and discomfort to 0/10 11/21/21 NEW  2 Pt will be ind with MLD for the Rt upper quadrant 11/21/21 NEW  3 Pt will obtain compression bra and UE sleeve as needed 11/21/21 NEW  4 Pt will be ind with final HEP 11/21/21 NEW     PLAN: PT FREQUENCY/DURATION: 1-2x per week up to 6 weeks   PLAN FOR NEXT SESSION: Rt UE MLD avoiding radiation field as it heals and then incorporating breast as needed, review self MLD     Stark Bray, PT 10/10/2021, 11:55 AM

## 2021-10-10 ENCOUNTER — Ambulatory Visit: Payer: 59 | Attending: Radiation Oncology | Admitting: Rehabilitation

## 2021-10-10 ENCOUNTER — Other Ambulatory Visit: Payer: Self-pay

## 2021-10-10 ENCOUNTER — Encounter: Payer: Self-pay | Admitting: Rehabilitation

## 2021-10-10 ENCOUNTER — Ambulatory Visit
Admission: RE | Admit: 2021-10-10 | Discharge: 2021-10-10 | Disposition: A | Discharge status: Home / Self Care | Payer: 59 | Source: Ambulatory Visit | Attending: Radiation Oncology | Admitting: Radiation Oncology

## 2021-10-10 DIAGNOSIS — C50311 Malignant neoplasm of lower-inner quadrant of right female breast: Secondary | ICD-10-CM | POA: Insufficient documentation

## 2021-10-10 DIAGNOSIS — R293 Abnormal posture: Secondary | ICD-10-CM | POA: Diagnosis not present

## 2021-10-10 DIAGNOSIS — Z170 Estrogen receptor positive status [ER+]: Secondary | ICD-10-CM | POA: Insufficient documentation

## 2021-10-10 DIAGNOSIS — Z483 Aftercare following surgery for neoplasm: Secondary | ICD-10-CM | POA: Insufficient documentation

## 2021-10-10 DIAGNOSIS — Z510 Encounter for antineoplastic radiation therapy: Secondary | ICD-10-CM | POA: Diagnosis not present

## 2021-10-10 LAB — RAD ONC ARIA SESSION SUMMARY
Course Elapsed Days: 29
Plan Fractions Treated to Date: 3
Plan Prescribed Dose Per Fraction: 2 Gy
Plan Total Fractions Prescribed: 4
Plan Total Prescribed Dose: 8 Gy
Reference Point Dosage Given to Date: 48.56 Gy
Reference Point Session Dosage Given: 2 Gy
Session Number: 19

## 2021-10-10 NOTE — Patient Instructions (Signed)
Deep Effective Breath   1.) Standing, sitting, or laying down, place both hands on the belly. Take a deep breath IN, expanding the belly; then breath OUT, contracting the belly.  2.) Circles at the collarbones x 10  http://gt2.exer.us/866   Copyright  VHI. All rights reserved.  Axilla to Axilla - Sweep   3.) On both side make 10 circles in the armpit,   4.) then pump _5__ times from involved armpit across chest to uninvolved armpit, making a pathway.   Copyright  VHI. All rights reserved.  Axilla to Inguinal Nodes - Sweep   5.) On involved side, make 5 circles at groin at panty line,   6.) then pump _5__ times from armpit along side of trunk to outer hip, making your other pathway. Do __1_ time per day.  Copyright  VHI. All rights reserved.  Arm Posterior: Elbow to Shoulder - Sweep   7.) Pump _5__ times from back of elbow to top of shoulder.   8.)Then inner to outer upper arm _5_ times, then outer arm again _5_ times. Then back to the pathways _2-3_ times. Do _1__ time per day.  Copyright  VHI. All rights reserved.  ARM: Volar Wrist to Elbow - Sweep   9.) Pump or stationary circles _5__ times from wrist to elbow making sure to do both sides of the forearm.   Copyright  VHI. All rights reserved.  ARM: Dorsum of Hand to Shoulder - Sweep   10.) Pump or stationary circles _5__ times on back of hand including knuckle spaces and individual fingers if needed working up towards the wrist,  11.) then retrace all your steps working back up the forearm, doing both sides; upper outer arm and back to your pathways _2-3_ times each. Then do 5 circles again at uninvolved armpit and involved groin where you started! Good job!! Do __1_ time per day.  Copyright  VHI. All rights reserved.

## 2021-10-13 ENCOUNTER — Encounter: Payer: Self-pay | Admitting: *Deleted

## 2021-10-13 ENCOUNTER — Other Ambulatory Visit: Payer: Self-pay

## 2021-10-13 ENCOUNTER — Encounter: Payer: Self-pay | Admitting: Radiation Oncology

## 2021-10-13 ENCOUNTER — Ambulatory Visit
Admission: RE | Admit: 2021-10-13 | Discharge: 2021-10-13 | Disposition: A | Discharge status: Home / Self Care | Payer: 59 | Source: Ambulatory Visit | Attending: Radiation Oncology | Admitting: Radiation Oncology

## 2021-10-13 DIAGNOSIS — Z170 Estrogen receptor positive status [ER+]: Secondary | ICD-10-CM | POA: Diagnosis not present

## 2021-10-13 DIAGNOSIS — C50311 Malignant neoplasm of lower-inner quadrant of right female breast: Secondary | ICD-10-CM

## 2021-10-13 DIAGNOSIS — Z510 Encounter for antineoplastic radiation therapy: Secondary | ICD-10-CM | POA: Diagnosis not present

## 2021-10-13 LAB — RAD ONC ARIA SESSION SUMMARY
Course Elapsed Days: 32
Plan Fractions Treated to Date: 4
Plan Prescribed Dose Per Fraction: 2 Gy
Plan Total Fractions Prescribed: 4
Plan Total Prescribed Dose: 8 Gy
Reference Point Dosage Given to Date: 50.56 Gy
Reference Point Session Dosage Given: 2 Gy
Session Number: 20

## 2021-10-13 NOTE — Progress Notes (Signed)
                                                                                                                                                                  Patient Name: Michelle Contreras MRN: [id number] DOB: 08/13/1960 Referring Physician: Bing Matter (Profile Not Attached) Date of Service: 10/13/2021 [facility] [place]                                                        End Of Treatment Note  Diagnoses: C50.311-Malignant neoplasm of lower-inner quadrant of right female breast  Cancer Staging: Stage IA, pT2N0M0, grade 2, ER/PR positive invasive ductal carcinoma of the right breast   Intent: Curative  Radiation Treatment Dates: 09/11/2021 through 10/13/2021 Site Technique Total Dose (Gy) Dose per Fx (Gy) Completed Fx Beam Energies  Breast, Right: Breast_R 3D 42.56/42.56 2.66 16/16 6X  Breast, Right: Breast_R_Bst 3D 8/8 2 4/4 6X   Narrative: The patient tolerated radiation therapy relatively well. She developed fatigue and anticipated skin changes in the treatment field and some inflammation in the axilla felt to be lymphatic congestion. She began working with PT for management of this.    Plan: The patient will receive a call in about one month from the radiation oncology department. She will continue follow up with Dr. Lindi Adie as well.   ________________________________________________    Carola Rhine, [facility]

## 2021-10-14 DIAGNOSIS — Z89512 Acquired absence of left leg below knee: Secondary | ICD-10-CM | POA: Diagnosis not present

## 2021-10-20 ENCOUNTER — Ambulatory Visit: Payer: 59 | Admitting: Rehabilitation

## 2021-10-20 ENCOUNTER — Encounter: Payer: Self-pay | Admitting: Rehabilitation

## 2021-10-20 DIAGNOSIS — R293 Abnormal posture: Secondary | ICD-10-CM | POA: Diagnosis not present

## 2021-10-20 DIAGNOSIS — Z483 Aftercare following surgery for neoplasm: Secondary | ICD-10-CM

## 2021-10-20 DIAGNOSIS — C50311 Malignant neoplasm of lower-inner quadrant of right female breast: Secondary | ICD-10-CM | POA: Diagnosis not present

## 2021-10-20 DIAGNOSIS — Z170 Estrogen receptor positive status [ER+]: Secondary | ICD-10-CM | POA: Diagnosis not present

## 2021-10-20 NOTE — Therapy (Signed)
OUTPATIENT PHYSICAL THERAPY BREAST CANCER TREATMENT   Patient Name: Michelle Contreras MRN: [id number] DOB:31-May-1960, 61 y.o., female Today's Date: 10/20/2021   PT End of Session - 10/20/21 0903     Visit Number 2    Number of Visits 9    Date for PT Re-Evaluation 11/21/21    PT Start Time 0905    PT Stop Time 0951    PT Time Calculation (min) 46 min    Activity Tolerance Patient tolerated treatment well    Behavior During Therapy [facility] for tasks assessed/performed              Past Medical History:  Diagnosis Date   Allergy    Arthritis    neck   Breast cancer ([place]) 05/29/2021   DVT (deep venous thrombosis) (HCC)    upper thight - right leg   Headache    migraines   HSV (herpes simplex virus) anogenital infection    Hx of   PONV (postoperative nausea and vomiting)    only with neck surgery.  No problems with any other surgery.   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[place])    Lower Extremitiy right leg   Past Surgical History:  Procedure Laterality Date   ABDOMINAL HYSTERECTOMY     amputation of lower limb Right 2001   right leg, below knee   BREAST LUMPECTOMY WITH RADIOACTIVE SEED AND SENTINEL LYMPH NODE BIOPSY Right 07/11/2021   Procedure: RIGHT BREAST LUMPECTOMY WITH RADIOACTIVE SEED AND SENTINEL LYMPH NODE BIOPSY;  Surgeon: Jovita Kussmaul, MD;  Location: [place];  Service: General;  Laterality: Right;   BREAST RECONSTRUCTION Right 07/22/2021   Procedure: BREAST RECONSTRUCTION;  Surgeon: Irene Limbo, MD;  Location: [place];  Service: Plastics;  Laterality: Right;   CHOLECYSTECTOMY  09/07/2011   Procedure: LAPAROSCOPIC CHOLECYSTECTOMY WITH INTRAOPERATIVE CHOLANGIOGRAM;  Surgeon: Haywood Lasso, MD;  Location: [place];  Service: General;  Laterality: N/A;   COLONOSCOPY  08/2010   polyps   LEG SURGERY Right 2017   LUMBAR LAMINECTOMY Left 07/17/2015   Procedure: MICRODISCECTOMY LUMBAR LAMINECTOMY  L4-L5 ON LEFT, CENTRAL DECOMPRESSION L4-L5 FOR SPINAL  STENOSIS, FORAMINOTOMY L4-L5;  Surgeon: Latanya Maudlin, MD;  [facility];  Service: Orthopedics;  Laterality: Left;   MASTOPEXY Left 07/22/2021   Procedure: MASTOPEXY;  Surgeon: Irene Limbo, MD;  Location: [place];  Service: Plastics;  Laterality: Left;   NECK SURGERY     fusion w/ plate   removal of benign growth to r breast     Patient Active Problem List   Diagnosis Date Noted   Genetic testing 06/17/2021   Malignant neoplasm of lower-inner quadrant of right breast of female, estrogen receptor positive ([place]) 06/05/2021   Allergic rhinitis 11/01/2017   Spinal stenosis, lumbar region, with neurogenic claudication 07/17/2015   Renal cyst 09/22/2011   Acute cholecystitis 09/07/2011   Family history of malignant neoplasm of gastrointestinal tract 08/14/2010    PCP: Aletha Halim., PA-C  REFERRING PROVIDER: Aletha Halim., PA-C  REFERRING DIAG: Right breast Cancer  THERAPY DIAG:  Abnormal posture  Malignant neoplasm of lower-inner quadrant of right breast of female, estrogen receptor positive [facility])  Aftercare following surgery for neoplasm  ONSET DATE: 05/29/2021  SUBJECTIVE  SUBJECTIVE STATEMENT: My skin is pretty bad.  Not having that bad pain anymore.    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 1 negative lymph node. ER/PR positive, HER2 negative. Currently finishing up radiation.  She has a prior history of sarcoma of the leg for which she underwent resection and radiation but then 3 years later it relapsed with bone involvement and therefore she required amputation of the leg. In 2002 she had a right ductectomy so she will have bilateral breast reconstruction in a week to a week and a half after her initial  surgery.  PATIENT GOALS   decrease arm pain  PAIN:  Are you having pain? No   PRECAUTIONS: Rt lymphedema risk ,Other: She has a prior history of malignant fibrous histiocytoma of the leg for which she underwent resection and radiation but then 3 years later it relapsed with bone involvement and therefore she required amputation of the leg, prior back surgery, history of DVT  HAND DOMINANCE: right  WEIGHT BEARING RESTRICTIONS No  FALLS:  Has patient fallen in last 6 months? 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, Technical sales engineer, swimming  [place] FUNCTION: Independent   OBJECTIVE  OBSERVATION/PALPATION: No cording noted in the arm, axilla is moderately puffy compared to the opposite side, radiation dermatitis evident  UPPER EXTREMITY AROM/PROM:  A/PROM RIGHT  06/18/21   Shoulder extension 72  Shoulder flexion 164  Shoulder abduction 180  Shoulder internal rotation 55  Shoulder external rotation 105    (Blank rows = not tested)  A/PROM LEFT  06/18/21  Shoulder extension 65  Shoulder flexion 156  Shoulder abduction 180  Shoulder internal rotation 50  Shoulder external rotation 92    (Blank rows = not tested)  LYMPHEDEMA ASSESSMENTS:   [facility] RIGHT  06/18/21 10/10/21  10 cm proximal to olecranon process 32.5 34.1  Olecranon process 28.0 29  10 cm proximal to ulnar styloid process 24.2 22.7  Just proximal to ulnar styloid process 17.9 18.6  Across hand at thumb web space 21.8 21.4  At base of 2nd digit 6.8 6.8  (Blank rows = not tested)  [facility] LEFT  06/18/21 10/10/21  10 cm proximal to olecranon process 32.9 34  Olecranon process 28.2 29.5  10 cm proximal to ulnar styloid process 23.2 22.8  Just proximal to ulnar styloid process 17.7 18  Across hand at thumb web space 21.1 21  At base of 2nd digit 6.7  7.0  (Blank rows = not tested)   L-DEX LYMPHEDEMA SCREENING: Unable secondary to prosthetic limb (right leg)  TODAY"S TREATMENT   Date: 10/20/21  In supine: Clavicular and sternal nodes, 5 diaphragmatic breaths,bil axillary nodes and establishment of interaxillary pathway, R inguinal nodes and establishment of axilloinguinal pathway, then R UE working proximal to distal, moving inner upper arm outwards and upwards, and doing both sides of forearms, then retracing all steps. Then in left sidelying posterior interaxillary work.  Education for patient on how to perform each step, reasons for MLD, and anatomy and physiology.  Pt performed each step in supine with cueing as needed.   Gave pt medium tg soft with large upper arm fold for comfort    Date: 10/10/21  In supine: Clavicular and sternal nodes, 5 diaphragmatic breaths,bil axillary nodes and establishment of interaxillary pathway, R inguinal nodes and establishment of axilloinguinal  pathway, then R UE working proximal to distal, moving inner upper arm outwards and upwards, and doing both sides of forearms, then retracing all steps. Then in left sidelying posterior interaxillary work.  Education for patient on how to perform each step, reasons for MLD, and anatomy and physiology.  Pt performed each step in supine with cueing as needed.   Gave pt new axillary pillow and instruction on using this and/or elevation   PATIENT EDUCATION:  Education details: Lymphedema risk reduction and post op shoulder/posture HEP, self MLD Rt UE, POC Person educated: Patient Education method: Explanation, Demonstration, Handout Education comprehension: Patient verbalized understanding and returned demonstration   HOME EXERCISE PROGRAM: Self MLD for the Rt UE - per instruction section  GARMENTS: as of 10/10/21 pt wanted to wait on getting a sleeve.    ASSESSMENT:  CLINICAL IMPRESSION: Pt has not been having the arm pain but is just feeling congested in the  axilla and has significant radiation dermatitis and pain in the Rt breast.   REHAB POTENTIAL: Excellent  CLINICAL DECISION MAKING: Changing  EVALUATION COMPLEXITY: Moderate   GOALS: Goals reviewed with patient? YES  LONG TERM GOALS: (STG=LTG)   Name Target Date Goal status  1 Pt will decrease Rt UE tingling and discomfort to 0/10 11/21/21 NEW  2 Pt will be ind with MLD for the Rt upper quadrant 11/21/21 NEW  3 Pt will obtain compression bra and UE sleeve as needed 11/21/21 NEW  4 Pt will be ind with final HEP 11/21/21 NEW     PLAN: PT FREQUENCY/DURATION: 1-2x per week up to 6 weeks   PLAN FOR NEXT SESSION: Rt UE MLD avoiding radiation field as it heals and then incorporating breast as needed, review self MLD     Stark Bray, PT 10/20/2021, 9:51 AM

## 2021-10-24 ENCOUNTER — Other Ambulatory Visit: Payer: Self-pay | Admitting: *Deleted

## 2021-10-24 ENCOUNTER — Encounter: Payer: Self-pay | Admitting: Hematology and Oncology

## 2021-10-24 MED ORDER — APIXABAN 5 MG PO TABS: ORAL_TABLET | ORAL | 4 refills | Status: DC

## 2021-11-03 ENCOUNTER — Encounter: Payer: Self-pay | Admitting: Internal Medicine

## 2021-11-03 ENCOUNTER — Ambulatory Visit: Payer: 59 | Attending: Radiation Oncology | Admitting: Rehabilitation

## 2021-11-03 ENCOUNTER — Ambulatory Visit: Payer: 59 | Admitting: Internal Medicine

## 2021-11-03 ENCOUNTER — Encounter: Payer: Self-pay | Admitting: Rehabilitation

## 2021-11-03 VITALS — BP 127/76 | HR 98 | Temp 97.2°F | Resp 16 | Ht 69.0 in | Wt 196.0 lb

## 2021-11-03 DIAGNOSIS — Z483 Aftercare following surgery for neoplasm: Secondary | ICD-10-CM | POA: Diagnosis not present

## 2021-11-03 DIAGNOSIS — C50311 Malignant neoplasm of lower-inner quadrant of right female breast: Secondary | ICD-10-CM | POA: Diagnosis not present

## 2021-11-03 DIAGNOSIS — Z89611 Acquired absence of right leg above knee: Secondary | ICD-10-CM | POA: Insufficient documentation

## 2021-11-03 DIAGNOSIS — Z170 Estrogen receptor positive status [ER+]: Secondary | ICD-10-CM | POA: Diagnosis not present

## 2021-11-03 DIAGNOSIS — R293 Abnormal posture: Secondary | ICD-10-CM | POA: Insufficient documentation

## 2021-11-03 DIAGNOSIS — R002 Palpitations: Secondary | ICD-10-CM | POA: Diagnosis not present

## 2021-11-03 DIAGNOSIS — I2782 Chronic pulmonary embolism: Secondary | ICD-10-CM | POA: Diagnosis not present

## 2021-11-03 MED ORDER — METOPROLOL TARTRATE 25 MG PO TABS: 12.5000 mg | ORAL_TABLET | ORAL | 3 refills | Status: DC | PRN

## 2021-11-03 NOTE — Patient Instructions (Addendum)
Continue current medications. Metoprolol 12.'5mg'$  for palpitations. Recommend staying on Eliquis for 1 year at least. Encourage low-sodium diet, less than 2000 mg daily. Schedule imaging tests in office. Follow-up in 1-2 months or sooner if needed.

## 2021-11-03 NOTE — Therapy (Signed)
OUTPATIENT PHYSICAL THERAPY BREAST CANCER TREATMENT   Patient Name: Michelle Contreras MRN: [id number] DOB:04-20-1960, 61 y.o., female Today's Date: 11/03/2021   PT End of Session - 11/03/21 1102     Visit Number 3    Number of Visits 9    Date for PT Re-Evaluation 11/21/21    PT Start Time 1105    PT Stop Time 1152    PT Time Calculation (min) 47 min    Activity Tolerance Patient tolerated treatment well    Behavior During Therapy [facility] for tasks assessed/performed              Past Medical History:  Diagnosis Date   Allergy    Arthritis    neck   Breast cancer ([place]) 05/29/2021   DVT (deep venous thrombosis) (HCC)    upper thight - right leg   Headache    migraines   HSV (herpes simplex virus) anogenital infection    Hx of   PONV (postoperative nausea and vomiting)    only with neck surgery.  No problems with any other surgery.   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[place])    Lower Extremitiy right leg   Past Surgical History:  Procedure Laterality Date   ABDOMINAL HYSTERECTOMY     amputation of lower limb Right 2001   right leg, below knee   BREAST LUMPECTOMY WITH RADIOACTIVE SEED AND SENTINEL LYMPH NODE BIOPSY Right 07/11/2021   Procedure: RIGHT BREAST LUMPECTOMY WITH RADIOACTIVE SEED AND SENTINEL LYMPH NODE BIOPSY;  Surgeon: Jovita Kussmaul, MD;  Location: [place];  Service: General;  Laterality: Right;   BREAST RECONSTRUCTION Right 07/22/2021   Procedure: BREAST RECONSTRUCTION;  Surgeon: Irene Limbo, MD;  Location: [place];  Service: Plastics;  Laterality: Right;   CHOLECYSTECTOMY  09/07/2011   Procedure: LAPAROSCOPIC CHOLECYSTECTOMY WITH INTRAOPERATIVE CHOLANGIOGRAM;  Surgeon: Haywood Lasso, MD;  Location[place] ;  Service: General;  Laterality: N/A;   COLONOSCOPY  08/2010   polyps   LEG SURGERY Right 2017   LUMBAR LAMINECTOMY Left 07/17/2015   Procedure: MICRODISCECTOMY LUMBAR LAMINECTOMY  L4-L5 ON LEFT, CENTRAL DECOMPRESSION L4-L5 FOR SPINAL  STENOSIS, FORAMINOTOMY L4-L5;  Surgeon: Latanya Maudlin, MD;  [facility];  Service: Orthopedics;  Laterality: Left;   MASTOPEXY Left 07/22/2021   Procedure: MASTOPEXY;  Surgeon: Irene Limbo, MD;  Location: [place];  Service: Plastics;  Laterality: Left;   NECK SURGERY     fusion w/ plate   removal of benign growth to r breast     Patient Active Problem List   Diagnosis Date Noted   Chronic pulmonary embolism without acute cor pulmonale ([place]) 11/03/2021   Palpitations 11/03/2021   Status post above-knee amputation of right lower extremity ([place]) 11/03/2021   Genetic testing 06/17/2021   Malignant neoplasm of lower-inner quadrant of right breast of female, estrogen receptor positive ([place]) 06/05/2021   Allergic rhinitis 11/01/2017   Spinal stenosis, lumbar region, with neurogenic claudication 07/17/2015   Renal cyst 09/22/2011   Acute cholecystitis 09/07/2011   Family history of malignant neoplasm of gastrointestinal tract 08/14/2010    PCP: Aletha Halim., PA-C  REFERRING PROVIDER: Hayden Pedro,*  REFERRING DIAG: Right breast Cancer  THERAPY DIAG:  Abnormal posture  Malignant neoplasm of lower-inner quadrant of right breast of female, estrogen receptor positive [facility])  Aftercare following surgery for neoplasm  ONSET DATE: 05/29/2021  SUBJECTIVE  SUBJECTIVE STATEMENT: When I wake up it is puffy and then it gets better.  I haven't had any of that nerve pain still.    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 1 negative lymph node. ER/PR positive, HER2 negative. Currently finishing up radiation.  She has a prior history of sarcoma of the leg for which she underwent resection and radiation but then 3 years later it  relapsed with bone involvement and therefore she required amputation of the leg. In 2002 she had a right ductectomy so she will have bilateral breast reconstruction in a week to a week and a half after her initial surgery.  PATIENT GOALS   decrease arm pain  PAIN:  Are you having pain? No  PRECAUTIONS: Rt lymphedema risk ,Other: She has a prior history of malignant fibrous histiocytoma of the leg for which she underwent resection and radiation but then 3 years later it relapsed with bone involvement and therefore she required amputation of the leg, prior back surgery, history of DVT  HAND DOMINANCE: right  WEIGHT BEARING RESTRICTIONS No  FALLS:  Has patient fallen in last 6 months? 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, Technical sales engineer, swimming  [place] FUNCTION: Independent   OBJECTIVE  OBSERVATION/PALPATION: No cording noted in the arm, axilla is moderately puffy compared to the opposite side, radiation dermatitis evident  UPPER EXTREMITY AROM/PROM:  A/PROM RIGHT  06/18/21  11/03/21  Shoulder extension 72   Shoulder flexion 164 165  Shoulder abduction 180 180  Shoulder internal rotation 55   Shoulder external rotation 105 100    (Blank rows = not tested)  A/PROM LEFT  06/18/21  Shoulder extension 65  Shoulder flexion 156  Shoulder abduction 180  Shoulder internal rotation 50  Shoulder external rotation 92    (Blank rows = not tested)  LYMPHEDEMA ASSESSMENTS:   [facility] RIGHT  06/18/21 10/10/21  10 cm proximal to olecranon process 32.5 34.1  Olecranon process 28.0 29  10 cm proximal to ulnar styloid process 24.2 22.7  Just proximal to ulnar styloid process 17.9 18.6  Across hand at thumb web space 21.8 21.4  At base of 2nd digit 6.8 6.8  (Blank rows = not tested)  [facility] LEFT  06/18/21 10/10/21  10 cm  proximal to olecranon process 32.9 34  Olecranon process 28.2 29.5  10 cm proximal to ulnar styloid process 23.2 22.8  Just proximal to ulnar styloid process 17.7 18  Across hand at thumb web space 21.1 21  At base of 2nd digit 6.7 7.0  (Blank rows = not tested)   L-DEX LYMPHEDEMA SCREENING: Unable secondary to prosthetic limb (right leg)  TODAY"S TREATMENT   Date: 11/03/21  In supine: Clavicular and sternal nodes, 5 diaphragmatic breaths,bil axillary nodes and establishment of interaxillary pathway, R inguinal nodes and establishment of axilloinguinal pathway, then R UE working proximal to distal, moving inner upper arm outwards and upwards, and doing both sides of forearms, then retracing all steps. Then in left sidelying posterior interaxillary work.   Discussed compression shirt options which pt wants to wait   Rechecked AROM which has remained at baseline now after radiation    Date: 10/20/21  In supine: Clavicular and sternal nodes, 5 diaphragmatic breaths,bil axillary nodes and establishment of interaxillary pathway, R inguinal nodes and establishment of axilloinguinal pathway, then R UE  working proximal to distal, moving inner upper arm outwards and upwards, and doing both sides of forearms, then retracing all steps. Then in left sidelying posterior interaxillary work.  Education for patient on how to perform each step, reasons for MLD, and anatomy and physiology.  Pt performed each step in supine with cueing as needed.   Gave pt medium tg soft with large upper arm fold for comfort    Date: 10/10/21  In supine: Clavicular and sternal nodes, 5 diaphragmatic breaths,bil axillary nodes and establishment of interaxillary pathway, R inguinal nodes and establishment of axilloinguinal pathway, then R UE working proximal to distal, moving inner upper arm outwards and upwards, and doing both sides of forearms, then retracing all steps. Then in left sidelying posterior interaxillary work.   Education for patient on how to perform each step, reasons for MLD, and anatomy and physiology.  Pt performed each step in supine with cueing as needed.   Gave pt new axillary pillow and instruction on using this and/or elevation   PATIENT EDUCATION:  Education details: Lymphedema risk reduction and post op shoulder/posture HEP, self MLD Rt UE, POC Person educated: Patient Education method: Explanation, Demonstration, Handout Education comprehension: Patient verbalized understanding and returned demonstration   HOME EXERCISE PROGRAM: Self MLD for the Rt UE - per instruction section  GARMENTS: as of 10/10/21 pt wanted to wait on getting a sleeve and compression shirt  ASSESSMENT:  CLINICAL IMPRESSION: Pt has not been having the arm pain but is just feeling congested in the axilla. Breast is now well healed from radiation but slightly tender to touch and with what feels like a seroma vs scar tissue at vertical incision mark.  Will add to MLD as tenderness decreases.   REHAB POTENTIAL: Excellent  CLINICAL DECISION MAKING: Changing  EVALUATION COMPLEXITY: Moderate   GOALS: Goals reviewed with patient? YES  LONG TERM GOALS: (STG=LTG)   Name Target Date Goal status  1 Pt will decrease Rt UE tingling and discomfort to 0/10 11/21/21 NEW  2 Pt will be ind with MLD for the Rt upper quadrant 11/21/21 NEW  3 Pt will obtain compression bra and UE sleeve as needed 11/21/21 NEW  4 Pt will be ind with final HEP 11/21/21 NEW     PLAN: PT FREQUENCY/DURATION: 1-2x per week up to 6 weeks   PLAN FOR NEXT SESSION: Rt UE MLD avoiding radiation field as it heals and then incorporating breast as needed, review self MLD     Stark Bray, PT 11/03/2021, 11:53 AM

## 2021-11-03 NOTE — Progress Notes (Signed)
Primary Physician/Referring:  Aletha Halim., PA-C  Patient ID: Michelle Contreras, female    DOB: February 25, 1961, 62 y.o.   MRN: [id number]  Chief Complaint  Patient presents with  . Palpitations  . New Patient (Initial Visit)   HPI:    Michelle Contreras  is a 61 y.o. female with past medical history significant for provoked DVT many years ago, right AKA, and recent pulmonary emboli which were provoked by her breast cancer and surgery.  Patient is here to establish care with cardiology.  She has been having a lot of palpitations since she was diagnosed with a pulmonary emboli.  She is taking Eliquis and sees her hematologist regularly.  She is no longer getting radiation therapy for breast cancer.  She did not and does not having chemotherapy.  Today she denies chest pain, diaphoresis, syncope, edema, PND, orthopnea.  She is short of breath with palpitations during activity however she does have bilateral pulmonary emboli so this is the likely reason.  Patient is agreeable to pill in the pocket for when her palpitations are severe.  Past Medical History:  Diagnosis Date  . Allergy   . Arthritis    neck  . Breast cancer ([place]) 05/29/2021  . DVT (deep venous thrombosis) (HCC)    upper thight - right leg  . Headache    migraines  . HSV (herpes simplex virus) anogenital infection    Hx of  . PONV (postoperative nausea and vomiting)    only with neck surgery.  No problems with any other surgery.  . 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 ABDOMINAL HYSTERECTOMY    . amputation of lower limb Right 2001   right leg, below knee  . BREAST LUMPECTOMY WITH RADIOACTIVE SEED AND SENTINEL LYMPH NODE BIOPSY Right 07/11/2021   Procedure: RIGHT BREAST LUMPECTOMY WITH RADIOACTIVE SEED AND SENTINEL LYMPH NODE BIOPSY;  Surgeon: Jovita Kussmaul, MD;  Location: [place];  Service: General;  Laterality: Right;  . BREAST  RECONSTRUCTION Right 07/22/2021   Procedure: BREAST RECONSTRUCTION;  Surgeon: Irene Limbo, MD;  Location: [place];  Service: Plastics;  Laterality: Right;  . CHOLECYSTECTOMY  09/07/2011   Procedure: LAPAROSCOPIC CHOLECYSTECTOMY WITH INTRAOPERATIVE CHOLANGIOGRAM;  Surgeon: Haywood Lasso, MD;  Location: [place];  Service: General;  Laterality: N/A;  . COLONOSCOPY  08/2010   polyps  . LEG SURGERY Right 2017  . LUMBAR LAMINECTOMY Left 07/17/2015   Procedure: MICRODISCECTOMY LUMBAR LAMINECTOMY  L4-L5 ON LEFT, CENTRAL DECOMPRESSION L4-L5 FOR SPINAL STENOSIS, FORAMINOTOMY L4-L5;  Surgeon: Latanya Maudlin, MD;  Location[facility];  Service: Orthopedics;  Laterality: Left;  Marland Kitchen MASTOPEXY Left 07/22/2021   Procedure: MASTOPEXY;  Surgeon: Irene Limbo, MD;  Location: [place];  Service: Plastics;  Laterality: Left;  . NECK SURGERY     fusion w/ plate  . removal of benign growth to r breast     Family History  Problem Relation Age of Onset  . Diabetes Mother   . Kidney disease Mother        dialysis  . Lung cancer Mother 47       lung  . Kidney cancer Father 4  . Breast cancer Father 23  . Colon polyps Father        >10 precancerous colon polyps  . Heart disease Father        ?  Marland Kitchen Other Sister  Skin syndrome  . Colon polyps Brother   . Throat cancer Brother   . Other Paternal Aunt        Amyloidosis  . Diabetes Maternal Grandfather   . Colon cancer Other   . Stomach cancer Neg Hx   . Rectal cancer Neg Hx     Social History   Tobacco Use  . Smoking status: Never    Passive exposure: Yes  . Smokeless tobacco: Never  Substance Use Topics  . Alcohol use: Yes    Alcohol/week: 1.0 standard drink of alcohol    Types: 1 Standard drinks or equivalent per week    Comment: occasional-weekends   Marital Status: Married  ROS  Review of Systems  Cardiovascular:  Positive for dyspnea on exertion, irregular heartbeat and palpitations.  All other  systems reviewed and are negative. Objective  Blood pressure 127/76, pulse 98, temperature (!) 97.2 F (36.2 C), temperature source Temporal, resp. rate 16, height '5\' 9"'$  (1.753 m), weight 196 lb (88.9 kg), SpO2 97 %. Body mass index is 28.94 kg/m.     11/03/2021    9:11 AM 09/29/2021    1:41 PM 08/04/2021   11:26 AM  Vitals with BMI  Height '5\' 9"'$  '5\' 9"'$  '5\' 9"'$   Weight 196 lbs 198 lbs 13 oz 194 lbs 10 oz  BMI 28.93 34.19 37.90  Systolic 240 973 532  Diastolic 76 73 68  Pulse 98 82 81     Physical Exam Constitutional:      Appearance: Normal appearance. She is normal weight.  HENT:     Head: Normocephalic and atraumatic.  Eyes:     Extraocular Movements: Extraocular movements intact.  Neck:     Vascular: No carotid bruit.  Cardiovascular:     Rate and Rhythm: Normal rate and regular rhythm.     Pulses: Normal pulses.     Heart sounds: Normal heart sounds. No murmur heard.    No friction rub. No gallop.  Pulmonary:     Effort: Pulmonary effort is normal.     Breath sounds: Normal breath sounds.  Abdominal:     General: Abdomen is flat. Bowel sounds are normal.     Palpations: Abdomen is soft.  Musculoskeletal:     Left lower leg: No edema.  Skin:    General: Skin is warm and dry.  Neurological:     Mental Status: She is alert.   Medications and allergies   Allergies  Allergen Reactions  . Adhesive [Tape] Other (See Comments)    Patient reports full thickness blister to adhesive - has to use paper tape  . Gabapentin     Other reaction(s): Mental Status Changes (intolerance)  . Tizanidine Other (See Comments)    "I feel really weak"  . Tramadol Nausea And Vomiting  . Wound Dressing Adhesive Other (See Comments)    Patient reports full thickness blister to adhesive  . Other Rash and Other (See Comments)    Topical Agents like lotions - blisters     Medication list after today's encounter   Current Outpatient Medications:  .  apixaban (ELIQUIS) 5 MG TABS tablet,  To twice daily for 7 days then 1 twice daily, Disp: 60 tablet, Rfl: 4 .  b complex vitamins capsule, Take 1 capsule by mouth daily.  , Disp: , Rfl:  .  cetirizine (ZYRTEC) 10 MG tablet, Take by mouth., Disp: , Rfl:  .  Cholecalciferol (VITAMIN D3) 10000 units TABS, Take 1,000 Units by mouth daily., Disp: ,  Rfl:  .  estradiol (ESTRACE) 1 MG tablet, Take 0.5 mg by mouth daily., Disp: , Rfl:  .  fluticasone (FLONASE) 50 MCG/ACT nasal spray, Place into both nostrils daily., Disp: , Rfl:  .  levocetirizine (XYZAL) 5 MG tablet, Take 2.5 mg by mouth every evening., Disp: , Rfl:  .  metoprolol tartrate (LOPRESSOR) 25 MG tablet, Take 0.5 tablets (12.5 mg total) by mouth as needed (for palpitations/tachycardia)., Disp: 30 tablet, Rfl: 3 .  Multiple Vitamin (MULTIVITAMIN WITH MINERALS) TABS tablet, Take 1 tablet by mouth daily., Disp: , Rfl:  .  pregabalin (LYRICA) 75 MG capsule, Take 75 mg by mouth. As needed, Disp: , Rfl:  .  SUMAtriptan (IMITREX) 100 MG tablet, Take 100 mg by mouth every 2 (two) hours as needed for migraine or headache. , Disp: , Rfl:  .  tretinoin (RETIN-A) 0.05 % cream, Apply 1 application topically at bedtime. Apply sparingly., Disp: , Rfl:   Laboratory examination:   Lab Results  Component Value Date   NA 141 07/31/2021   K 4.4 07/31/2021   CO2 26 07/31/2021   GLUCOSE 137 (H) 07/31/2021   BUN 14 07/31/2021   CREATININE 1.15 (H) 07/31/2021   CALCIUM 9.3 07/31/2021   GFRNONAA 54 (L) 07/31/2021       Latest Ref Rng & Units 07/31/2021    3:17 PM 06/25/2015    9:10 PM 09/07/2011    2:55 PM  CMP  Glucose 70 - 99 mg/dL 137  110    BUN 8 - 23 mg/dL 14  16    Creatinine 0.44 - 1.00 mg/dL 1.15  0.65  0.87   Sodium 135 - 145 mmol/L 141  133    Potassium 3.5 - 5.1 mmol/L 4.4  4.0    Chloride 98 - 111 mmol/L 107  98    CO2 22 - 32 mmol/L 26  27    Calcium 8.9 - 10.3 mg/dL 9.3  8.8    Total Protein 6.5 - 8.1 g/dL  6.5    Total Bilirubin 0.3 - 1.2 mg/dL  0.8    Alkaline Phos 38 -  126 U/L  47    AST 15 - 41 U/L  24    ALT 14 - 54 U/L  23        Latest Ref Rng & Units 07/31/2021    3:17 PM 06/25/2015    9:10 PM 09/07/2011    2:55 PM  CBC  WBC 4.0 - 10.5 K/uL 6.8  7.4  8.6   Hemoglobin 12.0 - 15.0 g/dL 13.1  13.9  10.2   Hematocrit 36.0 - 46.0 % 39.6  40.1  30.1   Platelets 150 - 400 K/uL 268  199  135     Lipid Panel No results for input(s): "CHOL", "TRIG", "[place]", "VLDL", "HDL", "CHOLHDL", "LDLDIRECT" in the last 8760 hours.  HEMOGLOBIN A1C No results found for: "HGBA1C", "MPG" TSH No results for input(s): "TSH" in the last 8760 hours.  External labs:     Radiology:    Cardiac Studies:   No results found for this or any previous visit from the past 1095 days.     No results found for this or any previous visit from the past 1095 days.     EKG:   11/03/21  ekg: NSR, RSR pattern - normal EKG  Assessment     ICD-10-CM   1. Palpitations  R00.2 EKG 12-Lead    PCV ECHOCARDIOGRAM COMPLETE    2. Chronic pulmonary embolism  without acute cor pulmonale, unspecified pulmonary embolism type (HCC)  I27.82 PCV ECHOCARDIOGRAM COMPLETE    3. Status post above-knee amputation of right lower extremity ([place])  Z89.611        Orders Placed This Encounter  Procedures  . EKG 12-Lead  . PCV ECHOCARDIOGRAM COMPLETE    Standing Status:   Future    Standing Expiration Date:   11/04/2022    Meds ordered this encounter  Medications  . metoprolol tartrate (LOPRESSOR) 25 MG tablet    Sig: Take 0.5 tablets (12.5 mg total) by mouth as needed (for palpitations/tachycardia).    Dispense:  30 tablet    Refill:  3    Medications Discontinued During This Encounter  Medication Reason  . HYDROcodone-acetaminophen (NORCO) 5-325 MG tablet      Recommendations:   Michelle Contreras is a 61 y.o.  F  Continue current medications. Metoprolol 12.'5mg'$  for palpitations. Recommend staying on Eliquis for 1 year at least. Encourage low-sodium diet, less than 2000 mg  daily. Schedule imaging tests in office. Follow-up in 1-2 months or sooner if needed.     Floydene Flock, DO  11/03/2021, 11:07 AM Office: [phone] Pager: [phone]

## 2021-11-05 ENCOUNTER — Ambulatory Visit: Payer: 59 | Admitting: Rehabilitation

## 2021-11-05 DIAGNOSIS — C50311 Malignant neoplasm of lower-inner quadrant of right female breast: Secondary | ICD-10-CM | POA: Diagnosis not present

## 2021-11-05 DIAGNOSIS — R293 Abnormal posture: Secondary | ICD-10-CM | POA: Diagnosis not present

## 2021-11-05 DIAGNOSIS — Z483 Aftercare following surgery for neoplasm: Secondary | ICD-10-CM | POA: Diagnosis not present

## 2021-11-05 DIAGNOSIS — Z170 Estrogen receptor positive status [ER+]: Secondary | ICD-10-CM | POA: Diagnosis not present

## 2021-11-05 NOTE — Therapy (Signed)
OUTPATIENT PHYSICAL THERAPY BREAST CANCER TREATMENT   Patient Name: Michelle Contreras MRN: [id number] DOB:[birth year], 61 y.o., female Today's Date: 11/05/2021   PT End of Session - 11/05/21 1358     Visit Number 4    Number of Visits 9    Date for PT Re-Evaluation 11/21/21    PT Start Time 1302    PT Stop Time 1355    PT Time Calculation (min) 53 min    Activity Tolerance Patient tolerated treatment well    Behavior During Therapy [facility] for tasks assessed/performed              Past Medical History:  Diagnosis Date   Allergy    Arthritis    neck   Breast cancer ([place]) 05/29/2021   DVT (deep venous thrombosis) (HCC)    upper thight - right leg   Headache    migraines   HSV (herpes simplex virus) anogenital infection    Hx of   PONV (postoperative nausea and vomiting)    only with neck surgery.  No problems with any other surgery.   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   Past Surgical History:  Procedure Laterality Date   ABDOMINAL HYSTERECTOMY     amputation of lower limb Right 2001   right leg, below knee   BREAST LUMPECTOMY WITH RADIOACTIVE SEED AND SENTINEL LYMPH NODE BIOPSY Right 07/11/2021   Procedure: RIGHT BREAST LUMPECTOMY WITH RADIOACTIVE SEED AND SENTINEL LYMPH NODE BIOPSY;  Surgeon: Jovita Kussmaul, MD;  Location: [place];  Service: General;  Laterality: Right;   BREAST RECONSTRUCTION Right 07/22/2021   Procedure: BREAST RECONSTRUCTION;  Surgeon: Irene Limbo, MD;  Location: [place];  Service: Plastics;  Laterality: Right;   CHOLECYSTECTOMY  09/07/2011   Procedure: LAPAROSCOPIC CHOLECYSTECTOMY WITH INTRAOPERATIVE CHOLANGIOGRAM;  Surgeon: Haywood Lasso, MD;  Location: [place];  Service: General;  Laterality: N/A;   COLONOSCOPY  08/2010   polyps   LEG SURGERY Right 2017   LUMBAR LAMINECTOMY Left 07/17/2015   Procedure: MICRODISCECTOMY LUMBAR LAMINECTOMY  L4-L5 ON LEFT, CENTRAL DECOMPRESSION L4-L5 FOR SPINAL  STENOSIS, FORAMINOTOMY L4-L5;  Surgeon: Latanya Maudlin, MD;  [facility];  Service: Orthopedics;  Laterality: Left;   MASTOPEXY Left 07/22/2021   Procedure: MASTOPEXY;  Surgeon: Irene Limbo, MD;  Location: [place];  Service: Plastics;  Laterality: Left;   NECK SURGERY     fusion w/ plate   removal of benign growth to r breast     Patient Active Problem List   Diagnosis Date Noted   Chronic pulmonary embolism without acute cor pulmonale ([place]) 11/03/2021   Palpitations 11/03/2021   Status post above-knee amputation of right lower extremity ([place]) 11/03/2021   Genetic testing 06/17/2021   Malignant neoplasm of lower-inner quadrant of right breast of female, estrogen receptor positive ([place]) 06/05/2021   Allergic rhinitis 11/01/2017   Spinal stenosis, lumbar region, with neurogenic claudication 07/17/2015   Renal cyst 09/22/2011   Acute cholecystitis 09/07/2011   Family history of malignant neoplasm of gastrointestinal tract 08/14/2010    PCP: Aletha Halim., PA-C  REFERRING PROVIDER: Hayden Pedro,*  REFERRING DIAG: Right breast Cancer  THERAPY DIAG:  Abnormal posture  Malignant neoplasm of lower-inner quadrant of right breast of female, estrogen receptor positive [facility])  Aftercare following surgery for neoplasm  ONSET DATE: 05/29/2021  SUBJECTIVE  SUBJECTIVE STATEMENT: I tried a bit of massage but I think it was too tender still   PERTINENT HISTORY:  Rt lumpectomy due to 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 1 negative lymph node. ER/PR positive, HER2 negative. Currently finishing up radiation.  She has a prior history of sarcoma of the leg for which she underwent resection and radiation but then 3 years later it relapsed with bone involvement and  therefore she required amputation of the leg. In 2002 she had a right ductectomy so she will have bilateral breast reconstruction in a week to a week and a half after her initial surgery.  PATIENT GOALS   decrease arm pain  PAIN:  Are you having pain? No  PRECAUTIONS: Rt lymphedema risk ,Other: She has a prior history of malignant fibrous histiocytoma of the leg for which she underwent resection and radiation but then 3 years later it relapsed with bone involvement and therefore she required amputation of the leg, prior back surgery, history of DVT  HAND DOMINANCE: right  WEIGHT BEARING RESTRICTIONS No  FALLS:  Has patient fallen in last 6 months? 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, Technical sales engineer, swimming  [place] FUNCTION: Independent   OBJECTIVE  OBSERVATION/PALPATION: No cording noted in the arm, axilla is moderately puffy compared to the opposite side, radiation dermatitis evident  UPPER EXTREMITY AROM/PROM:  A/PROM RIGHT  06/18/21  11/03/21  Shoulder extension 72   Shoulder flexion 164 165  Shoulder abduction 180 180  Shoulder internal rotation 55   Shoulder external rotation 105 100    (Blank rows = not tested)  A/PROM LEFT  06/18/21  Shoulder extension 65  Shoulder flexion 156  Shoulder abduction 180  Shoulder internal rotation 50  Shoulder external rotation 92    (Blank rows = not tested)  LYMPHEDEMA ASSESSMENTS:   [facility] RIGHT  06/18/21 10/10/21  10 cm proximal to olecranon process 32.5 34.1  Olecranon process 28.0 29  10 cm proximal to ulnar styloid process 24.2 22.7  Just proximal to ulnar styloid process 17.9 18.6  Across hand at thumb web space 21.8 21.4  At base of 2nd digit 6.8 6.8  (Blank rows = not tested)  [facility] LEFT  06/18/21 10/10/21  10 cm proximal to olecranon process 32.9 34   Olecranon process 28.2 29.5  10 cm proximal to ulnar styloid process 23.2 22.8  Just proximal to ulnar styloid process 17.7 18  Across hand at thumb web space 21.1 21  At base of 2nd digit 6.7 7.0  (Blank rows = not tested)   L-DEX LYMPHEDEMA SCREENING: Unable secondary to prosthetic limb (right leg)  TODAY"S TREATMENT   Date: 11/05/21  In supine: Clavicular and sternal nodes, 5 diaphragmatic breaths,bil axillary nodes and establishment of interaxillary pathway, R inguinal nodes and establishment of axilloinguinal pathway, then R UE working proximal to distal, moving inner upper arm outwards and upwards, and doing both sides of forearms, then retracing all steps. Then in left sidelying posterior interaxillary work.   Added some STM to the axilla and pectoralis border in supine using a small amt of cocoa butter as pt has a lot of skin allergies but thinks it will be fine. And lat and subscap release in sidelying.   Gave pt small spaghetti foam but instruction to not use until not sore     Date: 11/03/21  In supine:  Clavicular and sternal nodes, 5 diaphragmatic breaths,bil axillary nodes and establishment of interaxillary pathway, R inguinal nodes and establishment of axilloinguinal pathway, then R UE working proximal to distal, moving inner upper arm outwards and upwards, and doing both sides of forearms, then retracing all steps. Then in left sidelying posterior interaxillary work.   Discussed compression shirt options which pt wants to wait   Rechecked AROM which has remained at baseline now after radiation    Date: 10/20/21  In supine: Clavicular and sternal nodes, 5 diaphragmatic breaths,bil axillary nodes and establishment of interaxillary pathway, R inguinal nodes and establishment of axilloinguinal pathway, then R UE working proximal to distal, moving inner upper arm outwards and upwards, and doing both sides of forearms, then retracing all steps. Then in left sidelying posterior  interaxillary work.  Education for patient on how to perform each step, reasons for MLD, and anatomy and physiology.  Pt performed each step in supine with cueing as needed.   Gave pt medium tg soft with large upper arm fold for comfort    Date: 10/10/21  In supine: Clavicular and sternal nodes, 5 diaphragmatic breaths,bil axillary nodes and establishment of interaxillary pathway, R inguinal nodes and establishment of axilloinguinal pathway, then R UE working proximal to distal, moving inner upper arm outwards and upwards, and doing both sides of forearms, then retracing all steps. Then in left sidelying posterior interaxillary work.  Education for patient on how to perform each step, reasons for MLD, and anatomy and physiology.  Pt performed each step in supine with cueing as needed.   Gave pt new axillary pillow and instruction on using this and/or elevation   PATIENT EDUCATION:  Education details: Lymphedema risk reduction and post op shoulder/posture HEP, self MLD Rt UE, POC Person educated: Patient Education method: Explanation, Demonstration, Handout Education comprehension: Patient verbalized understanding and returned demonstration   HOME EXERCISE PROGRAM: Self MLD for the Rt UE - per instruction section  GARMENTS: as of 10/10/21 pt wanted to wait on getting a sleeve and compression shirt  ASSESSMENT:  CLINICAL IMPRESSION: Pt has not been having the arm pain but is just feeling congested in the axilla. Breast is now well healed from radiation but slightly tender to touch and with what feels like a seroma vs scar tissue at vertical incision mark. Started some more STM today with trigger points in axilla near lat insertion   REHAB POTENTIAL: Excellent  CLINICAL DECISION MAKING: Changing  EVALUATION COMPLEXITY: Moderate   GOALS: Goals reviewed with patient? YES  LONG TERM GOALS: (STG=LTG)   Name Target Date Goal status  1 Pt will decrease Rt UE tingling and discomfort to 0/10  11/21/21 NEW  2 Pt will be ind with MLD for the Rt upper quadrant 11/21/21 NEW  3 Pt will obtain compression bra and UE sleeve as needed 11/21/21 NEW  4 Pt will be ind with final HEP 11/21/21 NEW     PLAN: PT FREQUENCY/DURATION: 1-2x per week up to 6 weeks   PLAN FOR NEXT SESSION: Rt UE MLD avoiding radiation field as it heals and then incorporating breast as needed, review self MLD     Stark Bray, PT 11/05/2021, 1:58 PM

## 2021-11-06 NOTE — Progress Notes (Signed)
  Radiation Oncology         [phone]) [phone] ________________________________  Name: Michelle Contreras MRN: [id number]  Date of Service: 11/11/2021  DOB: 1961/03/13  Post Treatment Telephone Note  Diagnosis:   Stage IA, pT2N0M0, grade 2, ER/PR positive invasive ductal carcinoma of the right breast   Intent: Curative  Radiation Treatment Dates: 09/11/2021 through 10/13/2021 Site Technique Total Dose (Gy) Dose per Fx (Gy) Completed Fx Beam Energies  Breast, Right: Breast_R 3D 42.56/42.56 2.66 16/16 6X  Breast, Right: Breast_R_Bst 3D 8/8 2 4/4 6X   Narrative: The patient tolerated radiation therapy relatively well. She developed fatigue and anticipated skin changes in the treatment field and some inflammation in the axilla felt to be lymphatic congestion. She began working with PT for management of this.    Impression/Plan: 1.  Stage IA, pT2N0M0, grade 2, ER/PR positive invasive ductal carcinoma of the right breast .  I was unable to reach the patient but left a voicemail and on the message, I discussed that we would be happy to continue to follow her as needed, but she will also continue to follow up with Dr. Lindi Adie in medical oncology. She was counseled to call if she had questions about skin care or measures to avoid sun exposure to this area.        Carola Rhine, PAC

## 2021-11-10 ENCOUNTER — Ambulatory Visit: Payer: 59 | Admitting: Rehabilitation

## 2021-11-11 ENCOUNTER — Ambulatory Visit
Admission: RE | Admit: 2021-11-11 | Discharge: 2021-11-11 | Disposition: A | Discharge status: Home / Self Care | Payer: 59 | Source: Ambulatory Visit | Attending: Radiation Oncology | Admitting: Radiation Oncology

## 2021-11-11 DIAGNOSIS — C50311 Malignant neoplasm of lower-inner quadrant of right female breast: Secondary | ICD-10-CM

## 2021-11-12 ENCOUNTER — Encounter: Payer: 59 | Admitting: Rehabilitation

## 2021-11-17 ENCOUNTER — Ambulatory Visit: Payer: 59 | Admitting: Rehabilitation

## 2021-11-19 ENCOUNTER — Ambulatory Visit: Payer: 59 | Admitting: Rehabilitation

## 2021-11-19 ENCOUNTER — Encounter: Payer: Self-pay | Admitting: Rehabilitation

## 2021-11-19 DIAGNOSIS — C50311 Malignant neoplasm of lower-inner quadrant of right female breast: Secondary | ICD-10-CM

## 2021-11-19 DIAGNOSIS — Z170 Estrogen receptor positive status [ER+]: Secondary | ICD-10-CM | POA: Diagnosis not present

## 2021-11-19 DIAGNOSIS — R293 Abnormal posture: Secondary | ICD-10-CM

## 2021-11-19 DIAGNOSIS — Z483 Aftercare following surgery for neoplasm: Secondary | ICD-10-CM | POA: Diagnosis not present

## 2021-11-19 NOTE — Therapy (Signed)
OUTPATIENT PHYSICAL THERAPY BREAST CANCER TREATMENT   Patient Name: Michelle Contreras MRN: [id number] DOB:February 09, 1961, 61 y.o., female Today's Date: 11/19/2021   PT End of Session - 11/19/21 1100     Visit Number 5    Number of Visits 9    Date for PT Re-Evaluation 11/21/21    PT Start Time 1100    PT Stop Time 1149    PT Time Calculation (min) 49 min    Activity Tolerance Patient tolerated treatment well    Behavior During Therapy [facility] for tasks assessed/performed              Past Medical History:  Diagnosis Date   Allergy    Arthritis    neck   Breast cancer ([place]) 05/29/2021   DVT (deep venous thrombosis) (HCC)    upper thight - right leg   Headache    migraines   HSV (herpes simplex virus) anogenital infection    Hx of   PONV (postoperative nausea and vomiting)    only with neck surgery.  No problems with any other surgery.   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[place])    Lower Extremitiy right leg   Past Surgical History:  Procedure Laterality Date   ABDOMINAL HYSTERECTOMY     amputation of lower limb Right 2001   right leg, below knee   BREAST LUMPECTOMY WITH RADIOACTIVE SEED AND SENTINEL LYMPH NODE BIOPSY Right 07/11/2021   Procedure: RIGHT BREAST LUMPECTOMY WITH RADIOACTIVE SEED AND SENTINEL LYMPH NODE BIOPSY;  Surgeon: Jovita Kussmaul, MD;  Location: [place];  Service: General;  Laterality: Right;   BREAST RECONSTRUCTION Right 07/22/2021   Procedure: BREAST RECONSTRUCTION;  Surgeon: Irene Limbo, MD;  Location: [place];  Service: Plastics;  Laterality: Right;   CHOLECYSTECTOMY  09/07/2011   Procedure: LAPAROSCOPIC CHOLECYSTECTOMY WITH INTRAOPERATIVE CHOLANGIOGRAM;  Surgeon: Haywood Lasso, MD;  Location: [place];  Service: General;  Laterality: N/A;   COLONOSCOPY  08/2010   polyps   LEG SURGERY Right 2017   LUMBAR LAMINECTOMY Left 07/17/2015   Procedure: MICRODISCECTOMY LUMBAR LAMINECTOMY  L4-L5 ON LEFT, CENTRAL DECOMPRESSION L4-L5 FOR SPINAL  STENOSIS, FORAMINOTOMY L4-L5;  Surgeon: Latanya Maudlin, MD;  [facility];  Service: Orthopedics;  Laterality: Left;   MASTOPEXY Left 07/22/2021   Procedure: MASTOPEXY;  Surgeon: Irene Limbo, MD;  Location: [place];  Service: Plastics;  Laterality: Left;   NECK SURGERY     fusion w/ plate   removal of benign growth to r breast     Patient Active Problem List   Diagnosis Date Noted   Chronic pulmonary embolism without acute cor pulmonale ([place]) 11/03/2021   Palpitations 11/03/2021   Status post above-knee amputation of right lower extremity ([place]) 11/03/2021   Genetic testing 06/17/2021   Malignant neoplasm of lower-inner quadrant of right breast of female, estrogen receptor positive ([place]) 06/05/2021   Allergic rhinitis 11/01/2017   Spinal stenosis, lumbar region, with neurogenic claudication 07/17/2015   Renal cyst 09/22/2011   Acute cholecystitis 09/07/2011   Family history of malignant neoplasm of gastrointestinal tract 08/14/2010    PCP: Aletha Halim., PA-C  REFERRING PROVIDER: Hayden Pedro,*  REFERRING DIAG: Right breast Cancer  THERAPY DIAG:  Abnormal posture  Malignant neoplasm of lower-inner quadrant of right breast of female, estrogen receptor positive [facility])  Aftercare following surgery for neoplasm  ONSET DATE: 05/29/2021  SUBJECTIVE  SUBJECTIVE STATEMENT: I am feeling good. That hard ball is just the most annoying thing.   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 1 negative lymph node. ER/PR positive, HER2 negative. Currently finishing up radiation.  She has a prior history of sarcoma of the leg for which she underwent resection and radiation but then 3 years later it relapsed with bone involvement  and therefore she required amputation of the leg. In 2002 she had a right ductectomy so she will have bilateral breast reconstruction in a week to a week and a half after her initial surgery.  PATIENT GOALS   decrease arm pain  PAIN:  Are you having pain? No  PRECAUTIONS: Rt lymphedema risk ,Other: She has a prior history of malignant fibrous histiocytoma of the leg for which she underwent resection and radiation but then 3 years later it relapsed with bone involvement and therefore she required amputation of the leg, prior back surgery, history of DVT  HAND DOMINANCE: right  WEIGHT BEARING RESTRICTIONS No  FALLS:  Has patient fallen in last 6 months? 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, Technical sales engineer, swimming  [place] FUNCTION: Independent   OBJECTIVE  OBSERVATION/PALPATION: No cording noted in the arm, axilla is moderately puffy compared to the opposite side, radiation dermatitis evident  UPPER EXTREMITY AROM/PROM:  A/PROM RIGHT  06/18/21  11/03/21  Shoulder extension 72   Shoulder flexion 164 165  Shoulder abduction 180 180  Shoulder internal rotation 55   Shoulder external rotation 105 100    (Blank rows = not tested)  A/PROM LEFT  06/18/21  Shoulder extension 65  Shoulder flexion 156  Shoulder abduction 180  Shoulder internal rotation 50  Shoulder external rotation 92    (Blank rows = not tested)  LYMPHEDEMA ASSESSMENTS:   [facility] RIGHT  06/18/21 10/10/21  10 cm proximal to olecranon process 32.5 34.1  Olecranon process 28.0 29  10 cm proximal to ulnar styloid process 24.2 22.7  Just proximal to ulnar styloid process 17.9 18.6  Across hand at thumb web space 21.8 21.4  At base of 2nd digit 6.8 6.8  (Blank rows = not tested)  [facility] LEFT  06/18/21 10/10/21  10 cm proximal to olecranon process 32.9  34  Olecranon process 28.2 29.5  10 cm proximal to ulnar styloid process 23.2 22.8  Just proximal to ulnar styloid process 17.7 18  Across hand at thumb web space 21.1 21  At base of 2nd digit 6.7 7.0  (Blank rows = not tested)   L-DEX LYMPHEDEMA SCREENING: Unable secondary to prosthetic limb (right leg)  TODAY"S TREATMENT   Date: 11/19/21  In supine: Clavicular and sternal nodes, 5 diaphragmatic breaths,bil axillary nodes and establishment of interaxillary pathway, R inguinal nodes and establishment of axilloinguinal pathway, then R UE upper arm only and Rt breast working proximal to distal, then retracing all steps. Then in left sidelying posterior interaxillary work.   STM to the axilla and pectoralis border in supine using a small amt of cocoa butter . And lat and subscap release in sidelying.  More scar tissue work to vertical incision avoiding any tenderness toward the nipple with education for patient Showed pt scar silicone sheets Supine scap with yellow x 10 each - omitted flexion     Date: 11/05/21  In supine: Clavicular and sternal nodes, 5  diaphragmatic breaths,bil axillary nodes and establishment of interaxillary pathway, R inguinal nodes and establishment of axilloinguinal pathway, then R UE working proximal to distal, moving inner upper arm outwards and upwards, and doing both sides of forearms, then retracing all steps. Then in left sidelying posterior interaxillary work.   Added some STM to the axilla and pectoralis border in supine using a small amt of cocoa butter as pt has a lot of skin allergies but thinks it will be fine. And lat and subscap release in sidelying.   Gave pt small spaghetti foam but instruction to not use until not sore     Date: 11/03/21  In supine: Clavicular and sternal nodes, 5 diaphragmatic breaths,bil axillary nodes and establishment of interaxillary pathway, R inguinal nodes and establishment of axilloinguinal pathway, then R UE working proximal to  distal, moving inner upper arm outwards and upwards, and doing both sides of forearms, then retracing all steps. Then in left sidelying posterior interaxillary work.   Discussed compression shirt options which pt wants to wait   Rechecked AROM which has remained at baseline now after radiation     PATIENT EDUCATION:  Education details: Lymphedema risk reduction and post op shoulder/posture HEP, self MLD Rt UE, POC Person educated: Patient Education method: Explanation, Demonstration, Handout Education comprehension: Patient verbalized understanding and returned demonstration   HOME EXERCISE PROGRAM: Self MLD for the Rt UE - per instruction section  GARMENTS: as of 10/10/21 pt wanted to wait on getting a sleeve and compression shirt  ASSESSMENT:  CLINICAL IMPRESSION: Pt has not been having the arm pain and is feeling less congested in the axilla. Was able to start some scar massage today and work MLD on the breast due to only slight tenderness at the nipple.  Still feels like a seroma or scar tissue at vertical incision mark.   REHAB POTENTIAL: Excellent  CLINICAL DECISION MAKING: Changing  EVALUATION COMPLEXITY: Moderate   GOALS: Goals reviewed with patient? YES  LONG TERM GOALS: (STG=LTG)   Name Target Date Goal status  1 Pt will decrease Rt UE tingling and discomfort to 0/10 11/21/21 NEW  2 Pt will be ind with MLD for the Rt upper quadrant 11/21/21 NEW  3 Pt will obtain compression bra and UE sleeve as needed 11/21/21 NEW  4 Pt will be ind with final HEP 11/21/21 NEW     PLAN: PT FREQUENCY/DURATION: 1-2x per week up to 6 weeks   PLAN FOR NEXT SESSION: Rt UE and breast MLD, gentle scar massage, get into strengthening     Stark Bray, PT 11/19/2021, 11:54 AM

## 2021-11-19 NOTE — Patient Instructions (Signed)
Side Pull: Double Arm   On back, knees bent, feet flat. Arms perpendicular to body, shoulder level, elbows straight but relaxed. Pull arms out to sides, elbows straight. Resistance band comes across collarbones, hands toward floor. Hold momentarily. Slowly return to starting position. Repeat _5-10__ times. Band color _yellow____   Sword   On back, knees bent, feet flat, left hand on left hip, right hand above left. Pull right arm DIAGONALLY (hip to shoulder) across chest. Bring right arm along head toward floor. Hold momentarily. Slowly return to starting position. Repeat _5-10__ times. Do with left arm. Band color _yellow_____   Shoulder Rotation: Double Arm   On back, knees bent, feet flat, elbows tucked at sides, bent 90, hands palms up. Pull hands apart and down toward floor, keeping elbows near sides. Hold momentarily. Slowly return to starting position. Repeat _5-10__ times. Band color __yellow____    

## 2021-11-25 ENCOUNTER — Encounter: Payer: 59 | Admitting: Rehabilitation

## 2021-12-04 ENCOUNTER — Encounter: Payer: Self-pay | Admitting: Rehabilitation

## 2021-12-04 ENCOUNTER — Ambulatory Visit: Payer: 59 | Attending: Radiation Oncology | Admitting: Rehabilitation

## 2021-12-04 DIAGNOSIS — Z483 Aftercare following surgery for neoplasm: Secondary | ICD-10-CM | POA: Diagnosis not present

## 2021-12-04 DIAGNOSIS — C50311 Malignant neoplasm of lower-inner quadrant of right female breast: Secondary | ICD-10-CM | POA: Insufficient documentation

## 2021-12-04 DIAGNOSIS — Z170 Estrogen receptor positive status [ER+]: Secondary | ICD-10-CM | POA: Insufficient documentation

## 2021-12-04 DIAGNOSIS — R293 Abnormal posture: Secondary | ICD-10-CM | POA: Diagnosis not present

## 2021-12-04 NOTE — Therapy (Signed)
OUTPATIENT PHYSICAL THERAPY BREAST CANCER TREATMENT   Patient Name: Michelle Contreras MRN: [id number] DOB:02-13-1961, 61 y.o., female Today's Date: 12/04/2021   PT End of Session - 12/04/21 0901     Visit Number 6    Number of Visits 9    PT Start Time 0902    PT Stop Time 0951    PT Time Calculation (min) 49 min    Activity Tolerance Patient tolerated treatment well    Behavior During Therapy [facility] for tasks assessed/performed              Past Medical History:  Diagnosis Date   Allergy    Arthritis    neck   Breast cancer ([place]) 05/29/2021   DVT (deep venous thrombosis) (HCC)    upper thight - right leg   Headache    migraines   HSV (herpes simplex virus) anogenital infection    Hx of   PONV (postoperative nausea and vomiting)    only with neck surgery.  No problems with any other surgery.   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[place])    Lower Extremitiy right leg   Past Surgical History:  Procedure Laterality Date   ABDOMINAL HYSTERECTOMY     amputation of lower limb Right 2001   right leg, below knee   BREAST LUMPECTOMY WITH RADIOACTIVE SEED AND SENTINEL LYMPH NODE BIOPSY Right 07/11/2021   Procedure: RIGHT BREAST LUMPECTOMY WITH RADIOACTIVE SEED AND SENTINEL LYMPH NODE BIOPSY;  Surgeon: Jovita Kussmaul, MD;  Location: [place];  Service: General;  Laterality: Right;   BREAST RECONSTRUCTION Right 07/22/2021   Procedure: BREAST RECONSTRUCTION;  Surgeon: Irene Limbo, MD;  Location: [place];  Service: Plastics;  Laterality: Right;   CHOLECYSTECTOMY  09/07/2011   Procedure: LAPAROSCOPIC CHOLECYSTECTOMY WITH INTRAOPERATIVE CHOLANGIOGRAM;  Surgeon: Haywood Lasso, MD;  Location: [place];  Service: General;  Laterality: N/A;   COLONOSCOPY  08/2010   polyps   LEG SURGERY Right 2017   LUMBAR LAMINECTOMY Left 07/17/2015   Procedure: MICRODISCECTOMY LUMBAR LAMINECTOMY  L4-L5 ON LEFT, CENTRAL DECOMPRESSION L4-L5 FOR SPINAL STENOSIS, FORAMINOTOMY L4-L5;  Surgeon:  Latanya Maudlin, MD;  [facility];  Service: Orthopedics;  Laterality: Left;   MASTOPEXY Left 07/22/2021   Procedure: MASTOPEXY;  Surgeon: Irene Limbo, MD;  Location: [place];  Service: Plastics;  Laterality: Left;   NECK SURGERY     fusion w/ plate   removal of benign growth to r breast     Patient Active Problem List   Diagnosis Date Noted   Chronic pulmonary embolism without acute cor pulmonale ([place]) 11/03/2021   Palpitations 11/03/2021   Status post above-knee amputation of right lower extremity ([place]) 11/03/2021   Genetic testing 06/17/2021   Malignant neoplasm of lower-inner quadrant of right breast of female, estrogen receptor positive ([place]) 06/05/2021   Allergic rhinitis 11/01/2017   Spinal stenosis, lumbar region, with neurogenic claudication 07/17/2015   Renal cyst 09/22/2011   Acute cholecystitis 09/07/2011   Family history of malignant neoplasm of gastrointestinal tract 08/14/2010    PCP: Aletha Halim., PA-C  REFERRING PROVIDER: Hayden Pedro,*  REFERRING DIAG: Right breast Cancer  THERAPY DIAG:  Abnormal posture  Malignant neoplasm of lower-inner quadrant of right breast of female, estrogen receptor positive [facility])  Aftercare following surgery for neoplasm  ONSET DATE: 05/29/2021  SUBJECTIVE  SUBJECTIVE STATEMENT: I think maybe it gets more sore from working on it.   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 1 negative lymph node. ER/PR positive, HER2 negative. Currently finishing up radiation.  She has a prior history of sarcoma of the leg for which she underwent resection and radiation but then 3 years later it relapsed with bone involvement and therefore she required amputation of the leg. In 2002  she had a right ductectomy so she will have bilateral breast reconstruction in a week to a week and a half after her initial surgery.  PATIENT GOALS   decrease arm pain  PAIN:  Are you having pain? No  PRECAUTIONS: Rt lymphedema risk ,Other: She has a prior history of malignant fibrous histiocytoma of the leg for which she underwent resection and radiation but then 3 years later it relapsed with bone involvement and therefore she required amputation of the leg, prior back surgery, history of DVT  HAND DOMINANCE: right  WEIGHT BEARING RESTRICTIONS No  FALLS:  Has patient fallen in last 6 months? 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, Technical sales engineer, swimming  [place] FUNCTION: Independent   OBJECTIVE  OBSERVATION/PALPATION: No cording noted in the arm, axilla is moderately puffy compared to the opposite side, radiation dermatitis evident  UPPER EXTREMITY AROM/PROM:  A/PROM RIGHT  06/18/21  11/03/21  Shoulder extension 72   Shoulder flexion 164 165  Shoulder abduction 180 180  Shoulder internal rotation 55   Shoulder external rotation 105 100    (Blank rows = not tested)  A/PROM LEFT  06/18/21  Shoulder extension 65  Shoulder flexion 156  Shoulder abduction 180  Shoulder internal rotation 50  Shoulder external rotation 92    (Blank rows = not tested)  LYMPHEDEMA ASSESSMENTS:   [facility] RIGHT  06/18/21 10/10/21  10 cm proximal to olecranon process 32.5 34.1  Olecranon process 28.0 29  10 cm proximal to ulnar styloid process 24.2 22.7  Just proximal to ulnar styloid process 17.9 18.6  Across hand at thumb web space 21.8 21.4  At base of 2nd digit 6.8 6.8  (Blank rows = not tested)  [facility] LEFT  06/18/21 10/10/21  10 cm proximal to olecranon process 32.9 34  Olecranon process 28.2 29.5  10 cm proximal to ulnar  styloid process 23.2 22.8  Just proximal to ulnar styloid process 17.7 18  Across hand at thumb web space 21.1 21  At base of 2nd digit 6.7 7.0  (Blank rows = not tested)   L-DEX LYMPHEDEMA SCREENING: Unable secondary to prosthetic limb (right leg)  TODAY"S TREATMENT   Date: 12/04/21   In supine: Clavicular and sternal nodes, 5 diaphragmatic breaths,bil axillary nodes and establishment of interaxillary pathway, R inguinal nodes and establishment of axilloinguinal pathway, then R UE upper arm only and Rt breast working proximal to distal but avoiding tender locations, then retracing all steps. Wall ball flexion easy, wall ball abduction x 5, back against wall flexion and abduction 2# x 5 each, yellow band row and extension x 10 bil, bil ER x 10, supine D2 x 10. STM to the axilla and pectoralis border in supine using a small amt of cocoa butter .     Date: 11/19/21  In supine: Clavicular and sternal nodes, 5 diaphragmatic breaths,bil axillary nodes and establishment of interaxillary pathway, R inguinal  nodes and establishment of axilloinguinal pathway, then R UE upper arm only and Rt breast working proximal to distal, then retracing all steps. Then in left sidelying posterior interaxillary work.   STM to the axilla and pectoralis border in supine using a small amt of cocoa butter . And lat and subscap release in sidelying.  More scar tissue work to vertical incision avoiding any tenderness toward the nipple with education for patient Showed pt scar silicone sheets Supine scap with yellow x 10 each - omitted flexion     Date: 11/05/21  In supine: Clavicular and sternal nodes, 5 diaphragmatic breaths,bil axillary nodes and establishment of interaxillary pathway, R inguinal nodes and establishment of axilloinguinal pathway, then R UE working proximal to distal, moving inner upper arm outwards and upwards, and doing both sides of forearms, then retracing all steps. Then in left sidelying posterior  interaxillary work.   Added some STM to the axilla and pectoralis border in supine using a small amt of cocoa butter as pt has a lot of skin allergies but thinks it will be fine. And lat and subscap release in sidelying.   Gave pt small spaghetti foam but instruction to not use until not sore        PATIENT EDUCATION:  Education details: Lymphedema risk reduction and post op shoulder/posture HEP, self MLD Rt UE, POC Person educated: Patient Education method: Explanation, Demonstration, Handout Education comprehension: Patient verbalized understanding and returned demonstration   HOME EXERCISE PROGRAM: Self MLD for the Rt UE - per instruction section  GARMENTS: as of 10/10/21 pt wanted to wait on getting a sleeve and compression shirt  ASSESSMENT:  CLINICAL IMPRESSION: Pt is noticing continued soreness with self massage.  Reviewed and pt seems to be using more of a poking pressure vs skin stretch so she will do this but stop if it is still tender.  Pt has not been having the arm pain and is feeling less congested in the axilla.   REHAB POTENTIAL: Excellent  CLINICAL DECISION MAKING: Changing  EVALUATION COMPLEXITY: Moderate   GOALS: Goals reviewed with patient? YES  LONG TERM GOALS: (STG=LTG)   Name Target Date Goal status  1 Pt will decrease Rt UE tingling and discomfort to 0/10 11/21/21 NEW  2 Pt will be ind with MLD for the Rt upper quadrant 11/21/21 NEW  3 Pt will obtain compression bra and UE sleeve as needed 11/21/21 NEW  4 Pt will be ind with final HEP 11/21/21 NEW     PLAN: PT FREQUENCY/DURATION: 1-2x per week up to 6 weeks   PLAN FOR NEXT SESSION: Rt UE and breast MLD, gentle scar massage, get into strengthening     Stark Bray, PT 12/04/2021, 9:52 AM

## 2021-12-09 ENCOUNTER — Ambulatory Visit: Payer: 59

## 2021-12-09 DIAGNOSIS — I2782 Chronic pulmonary embolism: Secondary | ICD-10-CM | POA: Diagnosis not present

## 2021-12-09 DIAGNOSIS — R002 Palpitations: Secondary | ICD-10-CM

## 2021-12-10 ENCOUNTER — Encounter: Payer: Self-pay | Admitting: Rehabilitation

## 2021-12-10 ENCOUNTER — Ambulatory Visit: Payer: 59 | Admitting: Rehabilitation

## 2021-12-10 DIAGNOSIS — R293 Abnormal posture: Secondary | ICD-10-CM

## 2021-12-10 DIAGNOSIS — Z170 Estrogen receptor positive status [ER+]: Secondary | ICD-10-CM

## 2021-12-10 DIAGNOSIS — C50311 Malignant neoplasm of lower-inner quadrant of right female breast: Secondary | ICD-10-CM | POA: Diagnosis not present

## 2021-12-10 DIAGNOSIS — Z483 Aftercare following surgery for neoplasm: Secondary | ICD-10-CM | POA: Diagnosis not present

## 2021-12-10 NOTE — Therapy (Signed)
OUTPATIENT PHYSICAL THERAPY BREAST CANCER TREATMENT   Patient Name: Michelle Contreras MRN: [id number] DOB:[birth year], 61 y.o., female Today's Date: 12/10/2021   PT End of Session - 12/10/21 1626     Visit Number 7    Number of Visits 9    Date for PT Re-Evaluation 11/21/21    PT Start Time 1100    PT Stop Time 1145    PT Time Calculation (min) 45 min    Activity Tolerance Patient tolerated treatment well    Behavior During Therapy [facility] for tasks assessed/performed               Past Medical History:  Diagnosis Date   Allergy    Arthritis    neck   Breast cancer ([place]) 05/29/2021   DVT (deep venous thrombosis) (HCC)    upper thight - right leg   Headache    migraines   HSV (herpes simplex virus) anogenital infection    Hx of   PONV (postoperative nausea and vomiting)    only with neck surgery.  No problems with any other surgery.   Sarcoma ([place])    Lower Extremitiy right leg   Past Surgical History:  Procedure Laterality Date   ABDOMINAL HYSTERECTOMY     amputation of lower limb Right 2001   right leg, below knee   BREAST LUMPECTOMY WITH RADIOACTIVE SEED AND SENTINEL LYMPH NODE BIOPSY Right 07/11/2021   Procedure: RIGHT BREAST LUMPECTOMY WITH RADIOACTIVE SEED AND SENTINEL LYMPH NODE BIOPSY;  Surgeon: Jovita Kussmaul, MD;  Location: [place];  Service: General;  Laterality: Right;   BREAST RECONSTRUCTION Right 07/22/2021   Procedure: BREAST RECONSTRUCTION;  Surgeon: Irene Limbo, MD;  Location: [place];  Service: Plastics;  Laterality: Right;   CHOLECYSTECTOMY  09/07/2011   Procedure: LAPAROSCOPIC CHOLECYSTECTOMY WITH INTRAOPERATIVE CHOLANGIOGRAM;  Surgeon: Haywood Lasso, MD;  Location: [place];  Service: General;  Laterality: N/A;   COLONOSCOPY  08/2010   polyps   LEG SURGERY Right 2017   LUMBAR LAMINECTOMY Left 07/17/2015   Procedure: MICRODISCECTOMY LUMBAR LAMINECTOMY  L4-L5 ON LEFT, CENTRAL DECOMPRESSION L4-L5 FOR SPINAL  STENOSIS, FORAMINOTOMY L4-L5;  Surgeon: Latanya Maudlin, MD;  Location: [facility];  Service: Orthopedics;  Laterality: Left;   MASTOPEXY Left 07/22/2021   Procedure: MASTOPEXY;  Surgeon: Irene Limbo, MD;  Location: [place];  Service: Plastics;  Laterality: Left;   NECK SURGERY     fusion w/ plate   removal of benign growth to r breast     Patient Active Problem List   Diagnosis Date Noted   Chronic pulmonary embolism without acute cor pulmonale ([place]) 11/03/2021   Palpitations 11/03/2021   Status post above-knee amputation of right lower extremity ([place]) 11/03/2021   Genetic testing 06/17/2021   Malignant neoplasm of lower-inner quadrant of right breast of female, estrogen receptor positive ([place]) 06/05/2021   Allergic rhinitis 11/01/2017   Spinal stenosis, lumbar region, with neurogenic claudication 07/17/2015   Renal cyst 09/22/2011   Acute cholecystitis 09/07/2011   Family history of malignant neoplasm of gastrointestinal tract 08/14/2010    PCP: Aletha Halim., PA-C  REFERRING PROVIDER: Hayden Pedro,*  REFERRING DIAG: Right breast Cancer  THERAPY DIAG:  Abnormal posture  Malignant neoplasm of lower-inner quadrant of right breast of female, estrogen receptor positive [facility])  Aftercare following surgery for neoplasm  ONSET DATE: 05/29/2021  SUBJECTIVE  SUBJECTIVE STATEMENT: I did not touch it last week except for lightly and it was better.    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 1 negative lymph node. ER/PR positive, HER2 negative.  She has a prior history of sarcoma of the leg for which she underwent resection and radiation but then 3 years later it relapsed with bone involvement and  therefore she required amputation of the leg.   PATIENT GOALS   decrease arm pain  PAIN:  Are you having pain? No  PRECAUTIONS: Rt lymphedema risk ,Other: She has a prior history of malignant fibrous histiocytoma of the leg for which she underwent resection and radiation but then 3 years later it relapsed with bone involvement and therefore she required amputation of the leg, prior back surgery, history of DVT  HAND DOMINANCE: right  WEIGHT BEARING RESTRICTIONS No  FALLS:  Has patient fallen in last 6 months? 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, Technical sales engineer, swimming  [place] FUNCTION: Independent   OBJECTIVE  OBSERVATION/PALPATION: No cording noted in the arm, axilla is moderately puffy compared to the opposite side, radiation dermatitis evident  UPPER EXTREMITY AROM/PROM:  A/PROM RIGHT  06/18/21  11/03/21  Shoulder extension 72   Shoulder flexion 164 165  Shoulder abduction 180 180  Shoulder internal rotation 55   Shoulder external rotation 105 100    (Blank rows = not tested)  A/PROM LEFT  06/18/21  Shoulder extension 65  Shoulder flexion 156  Shoulder abduction 180  Shoulder internal rotation 50  Shoulder external rotation 92    (Blank rows = not tested)  LYMPHEDEMA ASSESSMENTS:   [facility] RIGHT  06/18/21 10/10/21  10 cm proximal to olecranon process 32.5 34.1  Olecranon process 28.0 29  10 cm proximal to ulnar styloid process 24.2 22.7  Just proximal to ulnar styloid process 17.9 18.6  Across hand at thumb web space 21.8 21.4  At base of 2nd digit 6.8 6.8  (Blank rows = not tested)  [facility] LEFT  06/18/21 10/10/21  10 cm proximal to olecranon process 32.9 34  Olecranon process 28.2 29.5  10 cm proximal to ulnar styloid process 23.2 22.8  Just proximal to ulnar styloid process 17.7 18  Across  hand at thumb web space 21.1 21  At base of 2nd digit 6.7 7.0  (Blank rows = not tested)   L-DEX LYMPHEDEMA SCREENING: Unable secondary to prosthetic limb (right leg)  TODAY"S TREATMENT   Date: 12/10/21   In supine: Clavicular and sternal nodes, 5 diaphragmatic breaths,bil axillary nodes and establishment of interaxillary pathway, R inguinal nodes and establishment of axilloinguinal pathway, then R UE upper arm only and Rt breast working proximal to distal but avoiding tender locations, then retracing all steps.  wall ball abduction x 5, back against wall flexion and abduction 2# 2 x 10 each, yellow band row and extension x 10 bil, chest press 3# x 10 STM to the axilla and pectoralis border in supine using a small amt of cocoa butter     Date: 12/04/21   In supine: Clavicular and sternal nodes, 5 diaphragmatic breaths,bil axillary nodes and establishment of interaxillary pathway, R inguinal nodes and establishment of axilloinguinal pathway, then R UE upper arm only and Rt breast working proximal to distal but avoiding tender locations, then retracing all steps. Wall  ball flexion easy, wall ball abduction x 5, back against wall flexion and abduction 2# 2 x 10 each, yellow band row and extension x 10 bil, bil ER x 10, supine D2 x 10. STM to the axilla and pectoralis border in supine using a small amt of cocoa butter .     Date: 11/19/21  In supine: Clavicular and sternal nodes, 5 diaphragmatic breaths,bil axillary nodes and establishment of interaxillary pathway, R inguinal nodes and establishment of axilloinguinal pathway, then R UE upper arm only and Rt breast working proximal to distal, then retracing all steps. Then in left sidelying posterior interaxillary work.   STM to the axilla and pectoralis border in supine using a small amt of cocoa butter . And lat and subscap release in sidelying.  More scar tissue work to vertical incision avoiding any tenderness toward the nipple with education for  patient Showed pt scar silicone sheets Supine scap with yellow x 10 each - omitted flexion       PATIENT EDUCATION:  Education details: Lymphedema risk reduction and post op shoulder/posture HEP, self MLD Rt UE, POC Person educated: Patient Education method: Explanation, Demonstration, Handout Education comprehension: Patient verbalized understanding and returned demonstration  HOME EXERCISE PROGRAM: Self MLD for the Rt UE - per instruction section  GARMENTS: as of 10/10/21 pt wanted to wait on getting a sleeve and compression shirt  ASSESSMENT: CLINICAL IMPRESSION: Pt is having less breast discomfort stopping MLD to the breast unless very light pressure.  Overall is meeting all goals and should have 1 visit left.   REHAB POTENTIAL: Excellent  CLINICAL DECISION MAKING: Changing  EVALUATION COMPLEXITY: Moderate   GOALS: Goals reviewed with patient? YES  LONG TERM GOALS: (STG=LTG)   Name Target Date Goal status  1 Pt will decrease Rt UE tingling and discomfort to 0/10 11/21/21 MET  2 Pt will be ind with MLD for the Rt upper quadrant 11/21/21 MET  3 Pt will obtain compression bra and UE sleeve as needed 11/21/21 DEFERRED   4 Pt will be ind with final HEP 11/21/21 ONGOING      PLAN: PT FREQUENCY/DURATION: 1-2x per week up to 6 weeks   PLAN FOR NEXT SESSION: Strength ABC vs bands for DC     Stark Bray, PT 12/10/2021, 4:27 PM

## 2021-12-12 ENCOUNTER — Encounter: Payer: Self-pay | Admitting: Rehabilitation

## 2021-12-12 ENCOUNTER — Ambulatory Visit: Payer: 59 | Admitting: Rehabilitation

## 2021-12-12 DIAGNOSIS — Z483 Aftercare following surgery for neoplasm: Secondary | ICD-10-CM | POA: Diagnosis not present

## 2021-12-12 DIAGNOSIS — R293 Abnormal posture: Secondary | ICD-10-CM | POA: Diagnosis not present

## 2021-12-12 DIAGNOSIS — C50311 Malignant neoplasm of lower-inner quadrant of right female breast: Secondary | ICD-10-CM | POA: Diagnosis not present

## 2021-12-12 DIAGNOSIS — Z170 Estrogen receptor positive status [ER+]: Secondary | ICD-10-CM | POA: Diagnosis not present

## 2021-12-12 NOTE — Therapy (Signed)
OUTPATIENT PHYSICAL THERAPY BREAST CANCER TREATMENT   Patient Name: Michelle Contreras MRN: [id number] DOB:[birth year], 61 y.o., female Today's Date: 12/12/2021   PT End of Session - 12/12/21 1059     Visit Number 8    Number of Visits 9    PT Start Time 1103    PT Stop Time 1156    PT Time Calculation (min) 53 min    Activity Tolerance Patient tolerated treatment well    Behavior During Therapy [facility] for tasks assessed/performed               Past Medical History:  Diagnosis Date   Allergy    Arthritis    neck   Breast cancer ([place]) 05/29/2021   DVT (deep venous thrombosis) (HCC)    upper thight - right leg   Headache    migraines   HSV (herpes simplex virus) anogenital infection    Hx of   PONV (postoperative nausea and vomiting)    only with neck surgery.  No problems with any other surgery.   Sarcoma ([place])    Lower Extremitiy right leg   Past Surgical History:  Procedure Laterality Date   ABDOMINAL HYSTERECTOMY     amputation of lower limb Right 2001   right leg, below knee   BREAST LUMPECTOMY WITH RADIOACTIVE SEED AND SENTINEL LYMPH NODE BIOPSY Right 07/11/2021   Procedure: RIGHT BREAST LUMPECTOMY WITH RADIOACTIVE SEED AND SENTINEL LYMPH NODE BIOPSY;  Surgeon: Jovita Kussmaul, MD;  Location: [place];  Service: General;  Laterality: Right;   BREAST RECONSTRUCTION Right 07/22/2021   Procedure: BREAST RECONSTRUCTION;  Surgeon: Irene Limbo, MD;  Location: [place];  Service: Plastics;  Laterality: Right;   CHOLECYSTECTOMY  09/07/2011   Procedure: LAPAROSCOPIC CHOLECYSTECTOMY WITH INTRAOPERATIVE CHOLANGIOGRAM;  Surgeon: Haywood Lasso, MD;  Location: [place];  Service: General;  Laterality: N/A;   COLONOSCOPY  08/2010   polyps   LEG SURGERY Right 2017   LUMBAR LAMINECTOMY Left 07/17/2015   Procedure: MICRODISCECTOMY LUMBAR LAMINECTOMY  L4-L5 ON LEFT, CENTRAL DECOMPRESSION L4-L5 FOR SPINAL STENOSIS, FORAMINOTOMY L4-L5;  Surgeon:  Latanya Maudlin, MD;  [facility];  Service: Orthopedics;  Laterality: Left;   MASTOPEXY Left 07/22/2021   Procedure: MASTOPEXY;  Surgeon: Irene Limbo, MD;  Location: [place];  Service: Plastics;  Laterality: Left;   NECK SURGERY     fusion w/ plate   removal of benign growth to r breast     Patient Active Problem List   Diagnosis Date Noted   Chronic pulmonary embolism without acute cor pulmonale ([place]) 11/03/2021   Palpitations 11/03/2021   Status post above-knee amputation of right lower extremity ([place]) 11/03/2021   Genetic testing 06/17/2021   Malignant neoplasm of lower-inner quadrant of right breast of female, estrogen receptor positive ([place]) 06/05/2021   Allergic rhinitis 11/01/2017   Spinal stenosis, lumbar region, with neurogenic claudication 07/17/2015   Renal cyst 09/22/2011   Acute cholecystitis 09/07/2011   Family history of malignant neoplasm of gastrointestinal tract 08/14/2010    PCP: Aletha Halim., PA-C  REFERRING PROVIDER: Hayden Pedro,*  REFERRING DIAG: Right breast Cancer  THERAPY DIAG:  Abnormal posture  Malignant neoplasm of lower-inner quadrant of right breast of female, estrogen receptor positive [facility])  Aftercare following surgery for neoplasm  ONSET DATE: 05/29/2021  SUBJECTIVE  SUBJECTIVE STATEMENT: [place] was actually fine   PERTINENT HISTORY:  Rt lumpectomy due to 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 1 negative lymph node. ER/PR positive, HER2 negative.  She has a prior history of sarcoma of the leg for which she underwent resection and radiation but then 3 years later it relapsed with bone involvement and therefore she required amputation of the leg.   PATIENT GOALS   decrease arm  pain  PAIN:  Are you having pain? No  PRECAUTIONS: Rt lymphedema risk ,Other: She has a prior history of malignant fibrous histiocytoma of the leg for which she underwent resection and radiation but then 3 years later it relapsed with bone involvement and therefore she required amputation of the leg, prior back surgery, history of DVT  HAND DOMINANCE: right  WEIGHT BEARING RESTRICTIONS No  FALLS:  Has patient fallen in last 6 months? 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, Technical sales engineer, swimming  [place] FUNCTION: Independent   OBJECTIVE  OBSERVATION/PALPATION: No cording noted in the arm, axilla is moderately puffy compared to the opposite side, radiation dermatitis evident  UPPER EXTREMITY AROM/PROM:  A/PROM RIGHT  06/18/21  11/03/21  Shoulder extension 72   Shoulder flexion 164 165  Shoulder abduction 180 180  Shoulder internal rotation 55   Shoulder external rotation 105 100    (Blank rows = not tested)  A/PROM LEFT  06/18/21  Shoulder extension 65  Shoulder flexion 156  Shoulder abduction 180  Shoulder internal rotation 50  Shoulder external rotation 92    (Blank rows = not tested)  LYMPHEDEMA ASSESSMENTS:   [facility] RIGHT  06/18/21 10/10/21  10 cm proximal to olecranon process 32.5 34.1  Olecranon process 28.0 29  10 cm proximal to ulnar styloid process 24.2 22.7  Just proximal to ulnar styloid process 17.9 18.6  Across hand at thumb web space 21.8 21.4  At base of 2nd digit 6.8 6.8  (Blank rows = not tested)  [facility] LEFT  06/18/21 10/10/21  10 cm proximal to olecranon process 32.9 34  Olecranon process 28.2 29.5  10 cm proximal to ulnar styloid process 23.2 22.8  Just proximal to ulnar styloid process 17.7 18  Across hand at thumb web space 21.1 21  At base of 2nd digit 6.7 7.0  (Blank rows = not  tested)   L-DEX LYMPHEDEMA SCREENING: Unable secondary to prosthetic limb (right leg)  TODAY"S TREATMENT   Date: 12/12/21   In supine: Clavicular and sternal nodes, R inguinal nodes and establishment of axilloinguinal pathway, then R UE upper arm only and Rt lateral trunk, then retracing all steps.  STM to the axilla and pectoralis border in supine using a small amt of cocoa butter  Performed new HEP per below with red band x 10 each.      Date: 12/10/21   In supine: Clavicular and sternal nodes, 5 diaphragmatic breaths,bil axillary nodes and establishment of interaxillary pathway, R inguinal nodes and establishment of axilloinguinal pathway, then R UE upper arm only and Rt breast working proximal to distal but avoiding tender locations, then retracing all steps.  wall ball abduction x 5, back against wall flexion and abduction 2# 2 x 10 each, yellow band row and extension x 10 bil, chest press 3# x 10 STM to the axilla and pectoralis border in supine using a small amt of cocoa  butter     Date: 12/04/21   In supine: Clavicular and sternal nodes, 5 diaphragmatic breaths,bil axillary nodes and establishment of interaxillary pathway, R inguinal nodes and establishment of axilloinguinal pathway, then R UE upper arm only and Rt breast working proximal to distal but avoiding tender locations, then retracing all steps. Wall ball flexion easy, wall ball abduction x 5, back against wall flexion and abduction 2# 2 x 10 each, yellow band row and extension x 10 bil, bil ER x 10, supine D2 x 10. STM to the axilla and pectoralis border in supine using a small amt of cocoa butter .         PATIENT EDUCATION:  Education details: Lymphedema risk reduction and post op shoulder/posture HEP, self MLD Rt UE, POC Person educated: Patient Education method: Explanation, Demonstration, Handout Education comprehension: Patient verbalized understanding and returned demonstration  HOME EXERCISE PROGRAM: Self MLD for the  Rt UE - per instruction section Access Code: [email or web address] URL: [email or web address] Date: 12/12/2021 Prepared by: Shan Levans  Exercises - Standing Shoulder Horizontal Abduction with Resistance  - 1 x daily - 3 x weekly - 1-3 sets - 10 reps - no hold - Standing Shoulder Single Arm PNF D2 Flexion with Resistance  - 1 x daily - 3 x weekly - 1-3 sets - 10 reps - no hold - Standing Shoulder External Rotation with Resistance  - 1 x daily - 3 x weekly - 1-3 sets - 10 reps - no hold - Standing Row with Anchored Resistance  - 1 x daily - 3 x weekly - 1-3 sets - 10 reps - no hold - Standing Shoulder Flexion with Resistance  - 1 x daily - 3 x weekly - 1-3 sets - 10 reps - no hold   GARMENTS: as of 10/10/21 pt wanted to wait on getting a sleeve and compression shirt  ASSESSMENT: CLINICAL IMPRESSION: Pt will have no visits until 11/1 and will cancel this if needed.  All goals are met at this time.    REHAB POTENTIAL: Excellent  CLINICAL DECISION MAKING: Changing  EVALUATION COMPLEXITY: Moderate   GOALS: Goals reviewed with patient? YES  LONG TERM GOALS: (STG=LTG)   Name Target Date Goal status  1 Pt will decrease Rt UE tingling and discomfort to 0/10 11/21/21 MET  2 Pt will be ind with MLD for the Rt upper quadrant 11/21/21 MET  3 Pt will obtain compression bra and UE sleeve as needed 11/21/21 DEFERRED   4 Pt will be ind with final HEP 11/21/21 MET     PLAN: PT FREQUENCY/DURATION: 1-2 more visits   PLAN FOR NEXT SESSION: Recheck - recert if need more visits     Stark Bray, PT 12/12/2021, 11:57 AM

## 2021-12-15 ENCOUNTER — Ambulatory Visit: Payer: 59 | Admitting: Internal Medicine

## 2021-12-16 ENCOUNTER — Encounter: Payer: Self-pay | Admitting: Internal Medicine

## 2021-12-16 ENCOUNTER — Ambulatory Visit: Payer: 59 | Admitting: Internal Medicine

## 2021-12-16 VITALS — BP 121/65 | HR 78 | Temp 97.2°F | Resp 16 | Ht 69.0 in | Wt 196.0 lb

## 2021-12-16 DIAGNOSIS — R002 Palpitations: Secondary | ICD-10-CM

## 2021-12-16 DIAGNOSIS — Z89611 Acquired absence of right leg above knee: Secondary | ICD-10-CM

## 2021-12-16 DIAGNOSIS — I2782 Chronic pulmonary embolism: Secondary | ICD-10-CM | POA: Diagnosis not present

## 2021-12-16 NOTE — Progress Notes (Signed)
Primary Physician/Referring:  Aletha Halim., PA-C  Patient ID: Michelle Contreras, female    DOB: 05-16-1960, 61 y.o.   MRN: [id number]  No chief complaint on file.  HPI:    Michelle Contreras  is a 61 y.o. female with past medical history significant for provoked DVT many years ago, right AKA, and recent pulmonary emboli which were provoked by her breast cancer and surgery.  Patient is here for follow-up visit. She is still having palpitations but the metoprolol is working. Her father has a history of Vfib arrest and she is concerned she may have an arrhythmia due to her persistent palpitations. She is agreeable to event monitor. Denies chest pain, shortness of breath, diaphoresis, syncope, edema, PND, orthopnea.   Past Medical History:  Diagnosis Date   Allergy    Arthritis    neck   Breast cancer ([place]) 05/29/2021   DVT (deep venous thrombosis) (HCC)    upper thight - right leg   Headache    migraines   HSV (herpes simplex virus) anogenital infection    Hx of   PONV (postoperative nausea and vomiting)    only with neck surgery.  No problems with any other surgery.   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[place])    Lower Extremitiy right leg   Past Surgical History:  Procedure Laterality Date   ABDOMINAL HYSTERECTOMY     amputation of lower limb Right 2001   right leg, below knee   BREAST LUMPECTOMY WITH RADIOACTIVE SEED AND SENTINEL LYMPH NODE BIOPSY Right 07/11/2021   Procedure: RIGHT BREAST LUMPECTOMY WITH RADIOACTIVE SEED AND SENTINEL LYMPH NODE BIOPSY;  Surgeon: Jovita Kussmaul, MD;  Location: [facility];  Service: General;  Laterality: Right;   BREAST RECONSTRUCTION Right 07/22/2021   Procedure: BREAST RECONSTRUCTION;  Surgeon: Irene Limbo, MD;  Location: [facility];  Service: Plastics;  Laterality: Right;   CHOLECYSTECTOMY  09/07/2011   Procedure: LAPAROSCOPIC CHOLECYSTECTOMY WITH INTRAOPERATIVE CHOLANGIOGRAM;  Surgeon: Haywood Lasso, MD;  Location: [facility];  Service:  General;  Laterality: N/A;   COLONOSCOPY  08/2010   polyps   LEG SURGERY Right 2017   LUMBAR LAMINECTOMY Left 07/17/2015   Procedure: MICRODISCECTOMY LUMBAR LAMINECTOMY  L4-L5 ON LEFT, CENTRAL DECOMPRESSION L4-L5 FOR SPINAL STENOSIS, FORAMINOTOMY L4-L5;  Surgeon: Latanya Maudlin, MD;  [facility];  Service: Orthopedics;  Laterality: Left;   MASTOPEXY Left 07/22/2021   Procedure: MASTOPEXY;  Surgeon: Irene Limbo, MD;  Location: [facility];  Service: Plastics;  Laterality: Left;   NECK SURGERY     fusion w/ plate   removal of benign growth to r breast     Family History  Problem Relation Age of Onset   Diabetes Mother    Kidney disease Mother        dialysis   Lung cancer Mother 80       lung   Kidney cancer Father 24   Breast cancer Father 15   Colon polyps Father        >10 precancerous colon polyps   Heart disease Father        ?   Other Sister        Skin syndrome   Colon polyps Brother    Throat cancer Brother    Other Paternal Aunt        Amyloidosis   Diabetes Maternal Grandfather    Colon cancer Other    Stomach cancer Neg Hx    Rectal cancer Neg Hx  Social History   Tobacco Use   Smoking status: Never    Passive exposure: Yes   Smokeless tobacco: Never  Substance Use Topics   Alcohol use: Yes    Alcohol/week: 1.0 standard drink of alcohol    Types: 1 Standard drinks or equivalent per week    Comment: occasional-weekends   Marital Status: Married  ROS  Review of Systems  Cardiovascular:  Positive for palpitations. Negative for dyspnea on exertion and irregular heartbeat.  All other systems reviewed and are negative.  Objective  Blood pressure 121/65, pulse 78, temperature (!) 97.2 F (36.2 C), temperature source Temporal, resp. rate 16, height '5\' 9"'$  (1.753 m), weight 196 lb (88.9 kg), SpO2 98 %. Body mass index is 28.94 kg/m.     12/16/2021    3:10 PM 11/03/2021    9:11 AM 09/29/2021    1:41 PM  Vitals with BMI  Height '5\' 9"'$   '5\' 9"'$  '5\' 9"'$   Weight 196 lbs 196 lbs 198 lbs 13 oz  BMI 28.93 52.84 13.24  Systolic 401 027 253  Diastolic 65 76 73  Pulse 78 98 82     Physical Exam Constitutional:      Appearance: Normal appearance. She is normal weight.  HENT:     Head: Normocephalic and atraumatic.  Eyes:     Extraocular Movements: Extraocular movements intact.  Neck:     Vascular: No carotid bruit.  Cardiovascular:     Rate and Rhythm: Normal rate and regular rhythm.     Pulses: Normal pulses.     Heart sounds: Normal heart sounds. No murmur heard.    No friction rub. No gallop.  Pulmonary:     Effort: Pulmonary effort is normal.     Breath sounds: Normal breath sounds.  Abdominal:     General: Abdomen is flat. Bowel sounds are normal.     Palpations: Abdomen is soft.  Musculoskeletal:     Left lower leg: No edema.  Skin:    General: Skin is warm and dry.  Neurological:     Mental Status: She is alert.   Medications and allergies   Allergies  Allergen Reactions   Adhesive [Tape] Other (See Comments)    Patient reports full thickness blister to adhesive - has to use paper tape   Gabapentin     Other reaction(s): Mental Status Changes (intolerance)   Tizanidine Other (See Comments)    "I feel really weak"   Tramadol Nausea And Vomiting   Wound Dressing Adhesive Other (See Comments)    Patient reports full thickness blister to adhesive   Other Rash and Other (See Comments)    Topical Agents like lotions - blisters     Medication list after today's encounter   Current Outpatient Medications:    apixaban (ELIQUIS) 5 MG TABS tablet, To twice daily for 7 days then 1 twice daily, Disp: 60 tablet, Rfl: 4   b complex vitamins capsule, Take 1 capsule by mouth daily.  , Disp: , Rfl:    cetirizine (ZYRTEC) 10 MG tablet, Take by mouth., Disp: , Rfl:    Cholecalciferol (VITAMIN D3) 10000 units TABS, Take 1,000 Units by mouth daily., Disp: , Rfl:    estradiol (ESTRACE) 1 MG tablet, Take 0.5 mg by mouth  daily., Disp: , Rfl:    fluticasone (FLONASE) 50 MCG/ACT nasal spray, Place into both nostrils daily., Disp: , Rfl:    levocetirizine (XYZAL) 5 MG tablet, Take 2.5 mg by mouth every evening., Disp: , Rfl:  Magnesium 100 MG TABS, Take by mouth., Disp: , Rfl:    Multiple Vitamin (MULTIVITAMIN WITH MINERALS) TABS tablet, Take 1 tablet by mouth daily., Disp: , Rfl:    pregabalin (LYRICA) 75 MG capsule, Take 75 mg by mouth. As needed, Disp: , Rfl:    SUMAtriptan (IMITREX) 100 MG tablet, Take 100 mg by mouth every 2 (two) hours as needed for migraine or headache. , Disp: , Rfl:    tretinoin (RETIN-A) 0.05 % cream, Apply 1 application topically at bedtime. Apply sparingly., Disp: , Rfl:    metoprolol tartrate (LOPRESSOR) 25 MG tablet, Take 0.5 tablets (12.5 mg total) by mouth as needed (for palpitations/tachycardia)., Disp: 30 tablet, Rfl: 3  Laboratory examination:   Lab Results  Component Value Date   NA 141 07/31/2021   K 4.4 07/31/2021   CO2 26 07/31/2021   GLUCOSE 137 (H) 07/31/2021   BUN 14 07/31/2021   CREATININE 1.15 (H) 07/31/2021   CALCIUM 9.3 07/31/2021   GFRNONAA 54 (L) 07/31/2021       Latest Ref Rng & Units 07/31/2021    3:17 PM 06/25/2015    9:10 PM 09/07/2011    2:55 PM  CMP  Glucose 70 - 99 mg/dL 137  110    BUN 8 - 23 mg/dL 14  16    Creatinine 0.44 - 1.00 mg/dL 1.15  0.65  0.87   Sodium 135 - 145 mmol/L 141  133    Potassium 3.5 - 5.1 mmol/L 4.4  4.0    Chloride 98 - 111 mmol/L 107  98    CO2 22 - 32 mmol/L 26  27    Calcium 8.9 - 10.3 mg/dL 9.3  8.8    Total Protein 6.5 - 8.1 g/dL  6.5    Total Bilirubin 0.3 - 1.2 mg/dL  0.8    Alkaline Phos 38 - 126 U/L  47    AST 15 - 41 U/L  24    ALT 14 - 54 U/L  23        Latest Ref Rng & Units 07/31/2021    3:17 PM 06/25/2015    9:10 PM 09/07/2011    2:55 PM  CBC  WBC 4.0 - 10.5 K/uL 6.8  7.4  8.6   Hemoglobin 12.0 - 15.0 g/dL 13.1  13.9  10.2   Hematocrit 36.0 - 46.0 % 39.6  40.1  30.1   Platelets 150 - 400 K/uL 268   199  135     Lipid Panel No results for input(s): "CHOL", "TRIG", "[place]", "VLDL", "HDL", "CHOLHDL", "LDLDIRECT" in the last 8760 hours.  HEMOGLOBIN A1C No results found for: "HGBA1C", "MPG" TSH No results for input(s): "TSH" in the last 8760 hours.  External labs:     Radiology:    Cardiac Studies:   Echocardiogram 12/09/2021:  Normal LV systolic function with visual EF 60-65%. Left ventricle cavity  is normal in size. Normal left ventricular wall thickness. Normal global  wall motion. Normal diastolic filling pattern, normal LAP.  Structurally normal mitral valve.  Mild to moderate mitral regurgitation.  Structurally normal tricuspid valve with trace regurgitation. No evidence  of pulmonary hypertension.  Pericardium is normal. Trace pericardial effusion.  no prior available for comparison.     EKG:   11/03/21  ekg: NSR, RSR pattern - normal EKG  Assessment     ICD-10-CM   1. Palpitations  R00.2     2. Chronic pulmonary embolism without acute cor pulmonale, unspecified pulmonary embolism type ([place])  I27.82     3. Status post above-knee amputation of right lower extremity ([place])  Z89.611        No orders of the defined types were placed in this encounter.   No orders of the defined types were placed in this encounter.   There are no discontinued medications.    Recommendations:   Michelle Contreras is a 61 y.o.  female with past medical history significant for provoked DVT many years ago, right AKA, and recent pulmonary emboli which were provoked by her breast cancer and surgery.  Patient is here for follow-up visit. She is still having palpitations but the metoprolol is working. Her father has a history of Vfib arrest and she is concerned she may have an arrhythmia due to her persistent palpitations. She is agreeable to event monitor. Denies chest pain, shortness of breath, diaphoresis, syncope, edema, PND, orthopnea.  Continue current medications. Will  continue PRN metoprolol for palpitations CT angio chest PE protocol ordered to f/u on PE's Recommend staying on Eliquis for 1 year at least. Encourage low-sodium diet, less than 2000 mg daily. Schedule event monitor to be placed in office, wear for 14 days. Discussed echocardiogram results with patient Follow-up in 6 months or sooner if needed.     Floydene Flock, DO  12/16/2021, 3:22 PM Office: [phone] Pager: [phone]

## 2021-12-17 ENCOUNTER — Encounter: Payer: Self-pay | Admitting: Hematology and Oncology

## 2021-12-23 ENCOUNTER — Other Ambulatory Visit: Payer: 59

## 2021-12-23 DIAGNOSIS — R002 Palpitations: Secondary | ICD-10-CM

## 2021-12-23 DIAGNOSIS — I2782 Chronic pulmonary embolism: Secondary | ICD-10-CM

## 2021-12-23 DIAGNOSIS — Z89611 Acquired absence of right leg above knee: Secondary | ICD-10-CM

## 2021-12-24 ENCOUNTER — Other Ambulatory Visit: Payer: 59

## 2021-12-24 DIAGNOSIS — R002 Palpitations: Secondary | ICD-10-CM | POA: Diagnosis not present

## 2021-12-24 DIAGNOSIS — Z89611 Acquired absence of right leg above knee: Secondary | ICD-10-CM | POA: Diagnosis not present

## 2021-12-24 DIAGNOSIS — I2782 Chronic pulmonary embolism: Secondary | ICD-10-CM | POA: Diagnosis not present

## 2021-12-30 ENCOUNTER — Encounter: Payer: Self-pay | Admitting: Internal Medicine

## 2021-12-30 NOTE — Telephone Encounter (Signed)
Patient of [facility]'   Please advise

## 2022-01-05 ENCOUNTER — Encounter: Payer: 59 | Admitting: Adult Health

## 2022-01-09 ENCOUNTER — Ambulatory Visit (HOSPITAL_COMMUNITY): Payer: 59

## 2022-01-12 ENCOUNTER — Encounter: Payer: 59 | Admitting: Adult Health

## 2022-01-13 DIAGNOSIS — R002 Palpitations: Secondary | ICD-10-CM | POA: Diagnosis not present

## 2022-01-16 ENCOUNTER — Encounter: Payer: Self-pay | Admitting: Internal Medicine

## 2022-01-20 DIAGNOSIS — R002 Palpitations: Secondary | ICD-10-CM | POA: Diagnosis not present

## 2022-01-20 DIAGNOSIS — I2782 Chronic pulmonary embolism: Secondary | ICD-10-CM | POA: Diagnosis not present

## 2022-01-20 DIAGNOSIS — Z89611 Acquired absence of right leg above knee: Secondary | ICD-10-CM | POA: Diagnosis not present

## 2022-01-20 NOTE — Telephone Encounter (Signed)
Patient is asking for monitor results

## 2022-01-21 ENCOUNTER — Encounter: Payer: Self-pay | Admitting: Rehabilitation

## 2022-01-21 ENCOUNTER — Ambulatory Visit: Payer: 59 | Attending: Radiation Oncology | Admitting: Rehabilitation

## 2022-01-21 DIAGNOSIS — R293 Abnormal posture: Secondary | ICD-10-CM | POA: Diagnosis not present

## 2022-01-21 DIAGNOSIS — C50311 Malignant neoplasm of lower-inner quadrant of right female breast: Secondary | ICD-10-CM | POA: Diagnosis not present

## 2022-01-21 DIAGNOSIS — Z170 Estrogen receptor positive status [ER+]: Secondary | ICD-10-CM | POA: Insufficient documentation

## 2022-01-21 DIAGNOSIS — Z483 Aftercare following surgery for neoplasm: Secondary | ICD-10-CM | POA: Diagnosis not present

## 2022-01-21 NOTE — Therapy (Signed)
[facility] OUTPATIENT PHYS[facility]ERAPY BREAST CANCER TREATMENT   Patient Name: Michelle Contreras MRN: [id number] DOB:1960/10/03, 60 y.o., female Today's Date: 01/21/2022   PT End of Session - 01/21/22 1103     Visit Number 9    Number of Visits 15    Date for PT Re-Evaluation 02/11/22    PT Start Time 66    PT Stop Time 1140    PT Time Calculation (min) 38 min    Activity Tolerance Patient tolerated treatment well    Behavior During Therapy [facility] for tasks assessed/performed                Past Medical History:  Diagnosis Date   Allergy    Arthritis    neck   Breast cancer ([place]) 05/29/2021   DVT (deep venous thrombosis) (HCC)    upper thight - right leg   Headache    migraines   HSV (herpes simplex virus) anogenital infection    Hx of   PONV (postoperative nausea and vomiting)    only with neck surgery.  No problems with any other surgery.   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[place])    Lower Extremitiy right leg   Past Surgical History:  Procedure Laterality Date   ABDOMINAL HYSTERECTOMY     amputation of lower limb Right 2001   right leg, below knee   BREAST LUMPECTOMY WITH RADIOACTIVE SEED AND SENTINEL LYMPH NODE BIOPSY Right 07/11/2021   Procedure: RIGHT BREAST LUMPECTOMY WITH RADIOACTIVE SEED AND SENTINEL LYMPH NODE BIOPSY;  Surgeon: Jovita Kussmaul, MD;  Location: [facility];  Service: General;  Laterality: Right;   BREAST RECONSTRUCTION Right 07/22/2021   Procedure: BREAST RECONSTRUCTION;  Surgeon: Irene Limbo, MD;  Location: [facility];  Service: Plastics;  Laterality: Right;   CHOLECYSTECTOMY  09/07/2011   Procedure: LAPAROSCOPIC CHOLECYSTECTOMY WITH INTRAOPERATIVE CHOLANGIOGRAM;  Surgeon: Haywood Lasso, MD;  Location: [facility];  Service: General;  Laterality: N/A;   COLONOSCOPY  08/2010   polyps   LEG SURGERY Right 2017   LUMBAR LAMINECTOMY Left 07/17/2015   Procedure: MICRODISCECTOMY LUMBAR LAMINECTOMY  L4-L5 ON LEFT, CENTRAL DECOMPRESSION L4-L5 FOR SPINAL  STENOSIS, FORAMINOTOMY L4-L5;  Surgeon: Latanya Maudlin, MD;  [facility];  Service: Orthopedics;  Laterality: Left;   MASTOPEXY Left 07/22/2021   Procedure: MASTOPEXY;  Surgeon: Irene Limbo, MD;  Location: [facility];  Service: Plastics;  Laterality: Left;   NECK SURGERY     fusion w/ plate   removal of benign growth to r breast     Patient Active Problem List   Diagnosis Date Noted   Chronic pulmonary embolism without acute cor pulmonale ([place]) 11/03/2021   Palpitations 11/03/2021   Status post above-knee amputation of right lower extremity ([place]) 11/03/2021   Genetic testing 06/17/2021   Malignant neoplasm of lower-inner quadrant of right breast of female, estrogen receptor positive ([place]) 06/05/2021   Allergic rhinitis 11/01/2017   Spinal stenosis, lumbar region, with neurogenic claudication 07/17/2015   Renal cyst 09/22/2011   Acute cholecystitis 09/07/2011   Family history of malignant neoplasm of gastrointestinal tract 08/14/2010    PCP: Aletha Halim., PA-C  REFERRING PROVIDER: Aletha Halim., PA-C  REFERRING DIAG: Right breast Cancer  THERAPY DIAG:  Abnormal posture  Malignant neoplasm of lower-inner quadrant of right breast of female, estrogen receptor positive [facility])  Aftercare following surgery for neoplasm  ONSET DATE: 05/29/2021  SUBJECTIVE  SUBJECTIVE STATEMENT: It is still so sore on the breast and still feeling swollen.  I see Dr. Iran Planas next week to make sure everything is okay.  I need to keep it moving or it gets tight.  I think it feels better if I do not massage it because it is so tender.  No real difference with the bra wear.    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 1  negative lymph node. ER/PR positive, HER2 negative.  She has a prior history of sarcoma of the leg for which she underwent resection and radiation but then 3 years later it relapsed with bone involvement and therefore she required amputation of the leg.   PATIENT GOALS   decrease arm pain  PAIN:  Are you having pain? No  PRECAUTIONS: Rt lymphedema risk ,Other: She has a prior history of malignant fibrous histiocytoma of the leg for which she underwent resection and radiation but then 3 years later it relapsed with bone involvement and therefore she required amputation of the leg, prior back surgery, history of DVT  HAND DOMINANCE: right  WEIGHT BEARING RESTRICTIONS No  FALLS:  Has patient fallen in last 6 months? 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, Technical sales engineer, swimming  [place] FUNCTION: Independent   OBJECTIVE  OBSERVATION/PALPATION: No cording noted in the arm, axilla is moderately puffy compared to the opposite side, radiation dermatitis evident  UPPER EXTREMITY AROM/PROM:  A/PROM RIGHT  06/18/21  11/03/21  Shoulder extension 72   Shoulder flexion 164 165  Shoulder abduction 180 180  Shoulder internal rotation 55   Shoulder external rotation 105 100    (Blank rows = not tested)  A/PROM LEFT  06/18/21  Shoulder extension 65  Shoulder flexion 156  Shoulder abduction 180  Shoulder internal rotation 50  Shoulder external rotation 92    (Blank rows = not tested)  LYMPHEDEMA ASSESSMENTS:   [facility] RIGHT  06/18/21 10/10/21  10 cm proximal to olecranon process 32.5 34.1  Olecranon process 28.0 29  10 cm proximal to ulnar styloid process 24.2 22.7  Just proximal to ulnar styloid process 17.9 18.6  Across hand at thumb web space 21.8 21.4  At base of 2nd digit 6.8 6.8  (Blank rows = not tested)  [facility]  LEFT  06/18/21 10/10/21  10 cm proximal to olecranon process 32.9 34  Olecranon process 28.2 29.5  10 cm proximal to ulnar styloid process 23.2 22.8  Just proximal to ulnar styloid process 17.7 18  Across hand at thumb web space 21.1 21  At base of 2nd digit 6.7 7.0  (Blank rows = not tested)   L-DEX LYMPHEDEMA SCREENING: Unable secondary to prosthetic limb (right leg)  TODAY"S TREATMENT   Date: 01/21/22  back against wall flexion and abduction 2# 2 x 10 each  chest press 3# 2 x 10  Protraction/retraction 3# 2x10  Cable row 7#  Single arm row 3# 2x10 bil  Single arm tricep extension 3# 2x10 bil  Wall ball alphabet bil x 13 letters bil    Wall ball flexion stretch    Date: 12/12/21   In supine: Clavicular and sternal nodes, R inguinal nodes and establishment of axilloinguinal pathway, then R UE upper arm only and Rt lateral trunk, then retracing all steps.  STM to the axilla and pectoralis  border in supine using a small amt of cocoa butter  Performed new HEP per below with red band x 10 each.      Date: 12/10/21   In supine: Clavicular and sternal nodes, 5 diaphragmatic breaths,bil axillary nodes and establishment of interaxillary pathway, R inguinal nodes and establishment of axilloinguinal pathway, then R UE upper arm only and Rt breast working proximal to distal but avoiding tender locations, then retracing all steps.  wall ball abduction x 5, back against wall flexion and abduction 2# 2 x 10 each, yellow band row and extension x 10 bil, chest press 3# x 10 STM to the axilla and pectoralis border in supine using a small amt of cocoa butter     Date: 12/04/21   In supine: Clavicular and sternal nodes, 5 diaphragmatic breaths,bil axillary nodes and establishment of interaxillary pathway, R inguinal nodes and establishment of axilloinguinal pathway, then R UE upper arm only and Rt breast working proximal to distal but avoiding tender locations, then retracing all steps. Wall ball flexion  easy, wall ball abduction x 5, back against wall flexion and abduction 2# 2 x 10 each, yellow band row and extension x 10 bil, bil ER x 10, supine D2 x 10. STM to the axilla and pectoralis border in supine using a small amt of cocoa butter .         PATIENT EDUCATION:  Education details: Lymphedema risk reduction and post op shoulder/posture HEP, self MLD Rt UE, POC Person educated: Patient Education method: Explanation, Demonstration, Handout Education comprehension: Patient verbalized understanding and returned demonstration  HOME EXERCISE PROGRAM: Self MLD for the Rt UE - per instruction section Access Code: [email or web address] URL: [email or web address] Date: 12/12/2021 Prepared by: Shan Levans  Exercises - Standing Shoulder Horizontal Abduction with Resistance  - 1 x daily - 3 x weekly - 1-3 sets - 10 reps - no hold - Standing Shoulder Single Arm PNF D2 Flexion with Resistance  - 1 x daily - 3 x weekly - 1-3 sets - 10 reps - no hold - Standing Shoulder External Rotation with Resistance  - 1 x daily - 3 x weekly - 1-3 sets - 10 reps - no hold - Standing Row with Anchored Resistance  - 1 x daily - 3 x weekly - 1-3 sets - 10 reps - no hold - Standing Shoulder Flexion with Resistance  - 1 x daily - 3 x weekly - 1-3 sets - 10 reps - no hold   GARMENTS: as of 10/10/21 pt wanted to wait on getting a sleeve and compression shirt  ASSESSMENT: CLINICAL IMPRESSION: Pt continues with significant inferior breast tenderness that remains with and without MLD and compression bra use, she would like to focus on strengthening and will see Dr. Iran Planas next week regarding the breast.      REHAB POTENTIAL: Excellent  CLINICAL DECISION MAKING: Changing  EVALUATION COMPLEXITY: Moderate   GOALS: Goals reviewed with patient? YES  LONG TERM GOALS: (STG=LTG)   Name Target Date Goal status  1 Pt will decrease Rt UE tingling and discomfort to 0/10 11/21/21 MET  2 Pt will be ind with MLD for the  Rt upper quadrant 11/21/21 MET  3 Pt will obtain compression bra and UE sleeve as needed 11/21/21 DEFERRED   4 Pt will be ind with final HEP 11/21/21 MET     PLAN: PT FREQUENCY/DURATION: 1-2 more visits   PLAN FOR NEXT SESSION: Recheck - recert if need more visits     Westen Dinino,  Adrian Prince, PT 01/21/2022, 11:56 AM

## 2022-01-23 ENCOUNTER — Ambulatory Visit: Payer: 59 | Admitting: Rehabilitation

## 2022-01-23 ENCOUNTER — Encounter: Payer: Self-pay | Admitting: Rehabilitation

## 2022-01-23 DIAGNOSIS — C50311 Malignant neoplasm of lower-inner quadrant of right female breast: Secondary | ICD-10-CM | POA: Diagnosis not present

## 2022-01-23 DIAGNOSIS — Z483 Aftercare following surgery for neoplasm: Secondary | ICD-10-CM | POA: Diagnosis not present

## 2022-01-23 DIAGNOSIS — Z170 Estrogen receptor positive status [ER+]: Secondary | ICD-10-CM | POA: Diagnosis not present

## 2022-01-23 DIAGNOSIS — R293 Abnormal posture: Secondary | ICD-10-CM

## 2022-01-23 NOTE — Therapy (Signed)
[facility] OUTPATIENT PHYSICAL THERAPY BREAST CANCER TREATMENT   Patient Name: Michelle Contreras MRN: [id number] DOB:06/05/1960, 61 y.o., female Today's Date: 01/23/2022   PT End of Session - 01/23/22 1102     Visit Number 10    Number of Visits 15    Date for PT Re-Evaluation 02/11/22    PT Start Time 1103    PT Stop Time 1142    PT Time Calculation (min) 39 min    Activity Tolerance Patient tolerated treatment well    Behavior During Therapy [facility] for tasks assessed/performed                Past Medical History:  Diagnosis Date   Allergy    Arthritis    neck   Breast cancer ([place]) 05/29/2021   DVT (deep venous thrombosis) (HCC)    upper thight - right leg   Headache    migraines   HSV (herpes simplex virus) anogenital infection    Hx of   PONV (postoperative nausea and vomiting)    only with neck surgery.  No problems with any other surgery.   Sarcoma ([place])    Lower Extremitiy right leg   Past Surgical History:  Procedure Laterality Date   ABDOMINAL HYSTERECTOMY     amputation of lower limb Right 2001   right leg, below knee   BREAST LUMPECTOMY WITH RADIOACTIVE SEED AND SENTINEL LYMPH NODE BIOPSY Right 07/11/2021   Procedure: RIGHT BREAST LUMPECTOMY WITH RADIOACTIVE SEED AND SENTINEL LYMPH NODE BIOPSY;  Surgeon: Jovita Kussmaul, MD;  Location: [place];  Service: General;  Laterality: Right;   BREAST RECONSTRUCTION Right 07/22/2021   Procedure: BREAST RECONSTRUCTION;  Surgeon: Irene Limbo, MD;  Location: [place];  Service: Plastics;  Laterality: Right;   CHOLECYSTECTOMY  09/07/2011   Procedure: LAPAROSCOPIC CHOLECYSTECTOMY WITH INTRAOPERATIVE CHOLANGIOGRAM;  Surgeon: Haywood Lasso, MD;  Location: [place];  Service: General;  Laterality: N/A;   COLONOSCOPY  08/2010   polyps   LEG SURGERY Right 2017   LUMBAR LAMINECTOMY Left 07/17/2015   Procedure: MICRODISCECTOMY LUMBAR LAMINECTOMY  L4-L5 ON LEFT, CENTRAL DECOMPRESSION L4-L5 FOR  SPINAL STENOSIS, FORAMINOTOMY L4-L5;  Surgeon: Latanya Maudlin, MD;  Location[facility];  Service: Orthopedics;  Laterality: Left;   MASTOPEXY Left 07/22/2021   Procedure: MASTOPEXY;  Surgeon: Irene Limbo, MD;  Location: [place];  Service: Plastics;  Laterality: Left;   NECK SURGERY     fusion w/ plate   removal of benign growth to r breast     Patient Active Problem List   Diagnosis Date Noted   Chronic pulmonary embolism without acute cor pulmonale ([place]) 11/03/2021   Palpitations 11/03/2021   Status post above-knee amputation of right lower extremity ([place]) 11/03/2021   Genetic testing 06/17/2021   Malignant neoplasm of lower-inner quadrant of right breast of female, estrogen receptor positive ([place]) 06/05/2021   Allergic rhinitis 11/01/2017   Spinal stenosis, lumbar region, with neurogenic claudication 07/17/2015   Renal cyst 09/22/2011   Acute cholecystitis 09/07/2011   Family history of malignant neoplasm of gastrointestinal tract 08/14/2010    PCP: Aletha Halim., PA-C  REFERRING PROVIDER: Hayden Pedro,*  REFERRING DIAG: Right breast Cancer  THERAPY DIAG:  Abnormal posture  Malignant neoplasm of lower-inner quadrant of right breast of female, estrogen receptor positive [facility])  Aftercare following surgery for neoplasm  ONSET DATE: 05/29/2021  SUBJECTIVE  SUBJECTIVE STATEMENT: No muscle soreness from last time.   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 1 negative lymph node. ER/PR positive, HER2 negative.  She has a prior history of sarcoma of the leg for which she underwent resection and radiation but then 3 years later it relapsed with bone involvement and therefore she required  amputation of the leg.   PATIENT GOALS   decrease arm pain  PAIN:  Are you having pain? No  PRECAUTIONS: Rt lymphedema risk ,Other: She has a prior history of malignant fibrous histiocytoma of the leg for which she underwent resection and radiation but then 3 years later it relapsed with bone involvement and therefore she required amputation of the leg, prior back surgery, history of DVT  HAND DOMINANCE: right  WEIGHT BEARING RESTRICTIONS No  FALLS:  Has patient fallen in last 6 months? 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, Technical sales engineer, swimming  [place] FUNCTION: Independent   OBJECTIVE  OBSERVATION/PALPATION: No cording noted in the arm, axilla is moderately puffy compared to the opposite side, radiation dermatitis evident  UPPER EXTREMITY AROM/PROM:  A/PROM RIGHT  06/18/21  11/03/21  Shoulder extension 72   Shoulder flexion 164 165  Shoulder abduction 180 180  Shoulder internal rotation 55   Shoulder external rotation 105 100    (Blank rows = not tested)  A/PROM LEFT  06/18/21  Shoulder extension 65  Shoulder flexion 156  Shoulder abduction 180  Shoulder internal rotation 50  Shoulder external rotation 92    (Blank rows = not tested)  LYMPHEDEMA ASSESSMENTS:   [facility] RIGHT  06/18/21 10/10/21  10 cm proximal to olecranon process 32.5 34.1  Olecranon process 28.0 29  10 cm proximal to ulnar styloid process 24.2 22.7  Just proximal to ulnar styloid process 17.9 18.6  Across hand at thumb web space 21.8 21.4  At base of 2nd digit 6.8 6.8  (Blank rows = not tested)  [facility] LEFT  06/18/21 10/10/21  10 cm proximal to olecranon process 32.9 34  Olecranon process 28.2 29.5  10 cm proximal to ulnar styloid process 23.2 22.8  Just proximal to ulnar styloid process 17.7 18  Across hand at thumb web space  21.1 21  At base of 2nd digit 6.7 7.0  (Blank rows = not tested)   L-DEX LYMPHEDEMA SCREENING: Unable secondary to prosthetic limb (right leg)  TODAY"S TREATMENT Date: 01/23/22    [facility] angel warm up x 5    Alternating flexion x 5 bil   back against wall flexion 2# 3x10  abduction 2# 2 x 10 each  chest press 3# 3x 10  Protraction/retraction 3# 2x10  Horizontal abduction red 3x10   Cable row 7# 3x10  Single arm tricep extension on cable machine 3# 3x10 bil  Wall ball alphabet bil x 13 letters bil x 2 with 1# weight  Wall ball flexion stretch    Date: 01/21/22  back against wall flexion and abduction 2# 2 x 10 each  chest press 3# 2 x 10  Protraction/retraction 3# 2x10  Cable row 7# 2x10  Single arm row 3# 2x10 bil  Single arm tricep extension 3# 2x10 bil  Wall ball alphabet bil x 13 letters bil    Posterior shoulder stretch     Date: 12/12/21   In supine: Clavicular and  sternal nodes, R inguinal nodes and establishment of axilloinguinal pathway, then R UE upper arm only and Rt lateral trunk, then retracing all steps.  STM to the axilla and pectoralis border in supine using a small amt of cocoa butter  Performed new HEP per below with red band x 10 each.      Date: 12/10/21   In supine: Clavicular and sternal nodes, 5 diaphragmatic breaths,bil axillary nodes and establishment of interaxillary pathway, R inguinal nodes and establishment of axilloinguinal pathway, then R UE upper arm only and Rt breast working proximal to distal but avoiding tender locations, then retracing all steps.  wall ball abduction x 5, back against wall flexion and abduction 2# 2 x 10 each, yellow band row and extension x 10 bil, chest press 3# x 10 STM to the axilla and pectoralis border in supine using a small amt of cocoa butter     Date: 12/04/21   In supine: Clavicular and sternal nodes, 5 diaphragmatic breaths,bil axillary nodes and establishment of interaxillary pathway, R inguinal nodes and  establishment of axilloinguinal pathway, then R UE upper arm only and Rt breast working proximal to distal but avoiding tender locations, then retracing all steps. Wall ball flexion easy, wall ball abduction x 5, back against wall flexion and abduction 2# 2 x 10 each, yellow band row and extension x 10 bil, bil ER x 10, supine D2 x 10. STM to the axilla and pectoralis border in supine using a small amt of cocoa butter .         PATIENT EDUCATION:  Education details: Lymphedema risk reduction and post op shoulder/posture HEP, self MLD Rt UE, POC Person educated: Patient Education method: Explanation, Demonstration, Handout Education comprehension: Patient verbalized understanding and returned demonstration  HOME EXERCISE PROGRAM: Self MLD for the Rt UE - per instruction section Access Code: [email or web address] URL: [email or web address] Date: 12/12/2021 Prepared by: Shan Levans  Exercises - Standing Shoulder Horizontal Abduction with Resistance  - 1 x daily - 3 x weekly - 1-3 sets - 10 reps - no hold - Standing Shoulder Single Arm PNF D2 Flexion with Resistance  - 1 x daily - 3 x weekly - 1-3 sets - 10 reps - no hold - Standing Shoulder External Rotation with Resistance  - 1 x daily - 3 x weekly - 1-3 sets - 10 reps - no hold - Standing Row with Anchored Resistance  - 1 x daily - 3 x weekly - 1-3 sets - 10 reps - no hold - Standing Shoulder Flexion with Resistance  - 1 x daily - 3 x weekly - 1-3 sets - 10 reps - no hold   GARMENTS: as of 10/10/21 pt wanted to wait on getting a sleeve and compression shirt  ASSESSMENT: CLINICAL IMPRESSION: Continued strengthening with increase in sets to 3 for most except abduction which were all tolerated well    REHAB POTENTIAL: Excellent  CLINICAL DECISION MAKING: Changing  EVALUATION COMPLEXITY: Moderate   GOALS: Goals reviewed with patient? YES  LONG TERM GOALS: (STG=LTG)   Name Target Date Goal status  1 Pt will decrease Rt UE  tingling and discomfort to 0/10 11/21/21 MET  2 Pt will be ind with MLD for the Rt upper quadrant 11/21/21 MET  3 Pt will obtain compression bra and UE sleeve as needed 11/21/21 DEFERRED   4 Pt will be ind with final HEP 11/21/21 MET     PLAN: PT FREQUENCY/DURATION: 1-2 more visits   PLAN FOR NEXT SESSION:  Recheck - recert if need more visits     Stark Bray, PT 01/23/2022, 11:45 AM

## 2022-01-26 ENCOUNTER — Ambulatory Visit (HOSPITAL_COMMUNITY)
Admission: RE | Admit: 2022-01-26 | Discharge: 2022-01-26 | Disposition: A | Discharge status: Home / Self Care | Payer: 59 | Source: Ambulatory Visit | Attending: Internal Medicine | Admitting: Internal Medicine

## 2022-01-26 DIAGNOSIS — R002 Palpitations: Secondary | ICD-10-CM | POA: Insufficient documentation

## 2022-01-26 DIAGNOSIS — I2782 Chronic pulmonary embolism: Secondary | ICD-10-CM | POA: Insufficient documentation

## 2022-01-26 DIAGNOSIS — Z89611 Acquired absence of right leg above knee: Secondary | ICD-10-CM | POA: Insufficient documentation

## 2022-01-26 MED ORDER — IOHEXOL 350 MG/ML SOLN
80.0000 mL | Freq: Once | INTRAVENOUS | Status: AC | PRN
  Administered 2022-01-26: 80 mL via INTRAVENOUS

## 2022-01-27 ENCOUNTER — Encounter: Payer: Self-pay | Admitting: Rehabilitation

## 2022-01-27 ENCOUNTER — Ambulatory Visit: Payer: 59 | Admitting: Rehabilitation

## 2022-01-27 DIAGNOSIS — Z483 Aftercare following surgery for neoplasm: Secondary | ICD-10-CM

## 2022-01-27 DIAGNOSIS — Z170 Estrogen receptor positive status [ER+]: Secondary | ICD-10-CM | POA: Diagnosis not present

## 2022-01-27 DIAGNOSIS — R293 Abnormal posture: Secondary | ICD-10-CM | POA: Diagnosis not present

## 2022-01-27 DIAGNOSIS — C50311 Malignant neoplasm of lower-inner quadrant of right female breast: Secondary | ICD-10-CM

## 2022-01-27 NOTE — Therapy (Addendum)
[facility] Kitchen OUTPATIENT PHYSICAL THERAPY BREAST CANCER TREATMENT   Patient Name: Michelle Contreras MRN: [id number] DOB:1961-01-12, 61 y.o., female Today's Date: 01/27/2022   PT End of Session - 01/27/22 0859     Visit Number 11    Number of Visits 15    Date for PT Re-Evaluation 02/11/22    PT Start Time 0901    PT Stop Time 0939    PT Time Calculation (min) 38 min    Activity Tolerance Patient tolerated treatment well    Behavior During Therapy [facility] for tasks assessed/performed                 Past Medical History:  Diagnosis Date   Allergy    Arthritis    neck   Breast cancer ([place]) 05/29/2021   DVT (deep venous thrombosis) (HCC)    upper thight - right leg   Headache    migraines   HSV (herpes simplex virus) anogenital infection    Hx of   PONV (postoperative nausea and vomiting)    only with neck surgery.  No problems with any other surgery.   Sarcoma ([place])    Lower Extremitiy right leg   Past Surgical History:  Procedure Laterality Date   ABDOMINAL HYSTERECTOMY     amputation of lower limb Right 2001   right leg, below knee   BREAST LUMPECTOMY WITH RADIOACTIVE SEED AND SENTINEL LYMPH NODE BIOPSY Right 07/11/2021   Procedure: RIGHT BREAST LUMPECTOMY WITH RADIOACTIVE SEED AND SENTINEL LYMPH NODE BIOPSY;  Surgeon: Jovita Kussmaul, MD;  Location: [place];  Service: General;  Laterality: Right;   BREAST RECONSTRUCTION Right 07/22/2021   Procedure: BREAST RECONSTRUCTION;  Surgeon: Irene Limbo, MD;  Location: [place];  Service: Plastics;  Laterality: Right;   CHOLECYSTECTOMY  09/07/2011   Procedure: LAPAROSCOPIC CHOLECYSTECTOMY WITH INTRAOPERATIVE CHOLANGIOGRAM;  Surgeon: Haywood Lasso, MD;  Location: [place];  Service: General;  Laterality: N/A;   COLONOSCOPY  08/2010   polyps   LEG SURGERY Right 2017   LUMBAR LAMINECTOMY Left 07/17/2015   Procedure: MICRODISCECTOMY LUMBAR LAMINECTOMY  L4-L5 ON LEFT, CENTRAL DECOMPRESSION L4-L5 FOR  SPINAL STENOSIS, FORAMINOTOMY L4-L5;  Surgeon: Latanya Maudlin, MD;  [facility];  Service: Orthopedics;  Laterality: Left;   MASTOPEXY Left 07/22/2021   Procedure: MASTOPEXY;  Surgeon: Irene Limbo, MD;  Location: [place];  Service: Plastics;  Laterality: Left;   NECK SURGERY     fusion w/ plate   removal of benign growth to r breast     Patient Active Problem List   Diagnosis Date Noted   Chronic pulmonary embolism without acute cor pulmonale ([place]) 11/03/2021   Palpitations 11/03/2021   Status post above-knee amputation of right lower extremity ([place]) 11/03/2021   Genetic testing 06/17/2021   Malignant neoplasm of lower-inner quadrant of right breast of female, estrogen receptor positive ([place]) 06/05/2021   Allergic rhinitis 11/01/2017   Spinal stenosis, lumbar region, with neurogenic claudication 07/17/2015   Renal cyst 09/22/2011   Acute cholecystitis 09/07/2011   Family history of malignant neoplasm of gastrointestinal tract 08/14/2010    PCP: Aletha Halim., PA-C  REFERRING PROVIDER: Hayden Pedro,*  REFERRING DIAG: Right breast Cancer  THERAPY DIAG:  Abnormal posture  Aftercare following surgery for neoplasm  Malignant neoplasm of lower-inner quadrant of right breast of female, estrogen receptor positive ([place])  ONSET DATE: 05/29/2021  SUBJECTIVE  SUBJECTIVE STATEMENT: No major changes and no real muscle soreness   PERTINENT HISTORY:  Rt lumpectomy due to 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 1 negative lymph node. ER/PR positive, HER2 negative.  She has a prior history of sarcoma of the leg for which she underwent resection and radiation but then 3 years later it relapsed with bone involvement and therefore she required  amputation of the leg.   PATIENT GOALS   decrease arm pain  PAIN:  Are you having pain? No  PRECAUTIONS: Rt lymphedema risk ,Other: She has a prior history of malignant fibrous histiocytoma of the leg for which she underwent resection and radiation but then 3 years later it relapsed with bone involvement and therefore she required amputation of the leg, prior back surgery, history of DVT  HAND DOMINANCE: right  WEIGHT BEARING RESTRICTIONS No  FALLS:  Has patient fallen in last 6 months? 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, Technical sales engineer, swimming  [place] FUNCTION: Independent   OBJECTIVE  OBSERVATION/PALPATION: No cording noted in the arm, axilla is moderately puffy compared to the opposite side, radiation dermatitis evident  UPPER EXTREMITY AROM/PROM:  A/PROM RIGHT  06/18/21  11/03/21  Shoulder extension 72   Shoulder flexion 164 165  Shoulder abduction 180 180  Shoulder internal rotation 55   Shoulder external rotation 105 100    (Blank rows = not tested)  A/PROM LEFT  06/18/21  Shoulder extension 65  Shoulder flexion 156  Shoulder abduction 180  Shoulder internal rotation 50  Shoulder external rotation 92    (Blank rows = not tested)  LYMPHEDEMA ASSESSMENTS:   [facility] RIGHT  06/18/21 10/10/21  10 cm proximal to olecranon process 32.5 34.1  Olecranon process 28.0 29  10 cm proximal to ulnar styloid process 24.2 22.7  Just proximal to ulnar styloid process 17.9 18.6  Across hand at thumb web space 21.8 21.4  At base of 2nd digit 6.8 6.8  (Blank rows = not tested)  [facility] LEFT  06/18/21 10/10/21  10 cm proximal to olecranon process 32.9 34  Olecranon process 28.2 29.5  10 cm proximal to ulnar styloid process 23.2 22.8  Just proximal to ulnar styloid process 17.7 18  Across hand at thumb web space  21.1 21  At base of 2nd digit 6.7 7.0  (Blank rows = not tested)   L-DEX LYMPHEDEMA SCREENING: Unable secondary to prosthetic limb (right leg)  TODAY'S TREATMENT  Date: 01/23/22    [facility] angel warm up x 10    Alternating flexion x 10 bil   back against wall flexion 2# 3x10  abduction 2# 2 x 10 each  chest press 3# 3x 10  Cable row 7# 3x10  Single arm tricep extension 3# 3x10 bil  Wall ball alphabet bil x 13 letters bil x 2  Wall pushups  Foam roller    -alternating flexion    -open book   Bent over row 2x10 #3  Resisted trunk rotation 7# cable machine 1x10 bil   Bicep curls 3x10 3#   Date: 01/23/22    [facility] angel warm up x 5    Alternating flexion x 5 bil   back against wall flexion 2# 3x10  abduction 2# 2 x 10 each  chest press 3# 3x 10  Protraction/retraction 3# 2x10  Horizontal abduction red 3x10   Cable row  7# 3x10  Single arm tricep extension on cable machine 3# 3x10 bil  Wall ball alphabet bil x 13 letters bil x 2 with 1# weight  Wall ball flexion stretch    Date: 01/21/22  back against wall flexion and abduction 2# 2 x 10 each  chest press 3# 2 x 10  Protraction/retraction 3# 2x10  Cable row 7# 2x10  Single arm row 3# 2x10 bil  Single arm tricep extension 3# 2x10 bil  Wall ball alphabet bil x 13 letters bil    Posterior shoulder stretch     Date: 12/12/21   In supine: Clavicular and sternal nodes, R inguinal nodes and establishment of axilloinguinal pathway, then R UE upper arm only and Rt lateral trunk, then retracing all steps.  STM to the axilla and pectoralis border in supine using a small amt of cocoa butter  Performed new HEP per below with red band x 10 each.      Date: 12/10/21   In supine: Clavicular and sternal nodes, 5 diaphragmatic breaths,bil axillary nodes and establishment of interaxillary pathway, R inguinal nodes and establishment of axilloinguinal pathway, then R UE upper arm only and Rt breast working proximal to distal but avoiding tender  locations, then retracing all steps.  wall ball abduction x 5, back against wall flexion and abduction 2# 2 x 10 each, yellow band row and extension x 10 bil, chest press 3# x 10 STM to the axilla and pectoralis border in supine using a small amt of cocoa butter     Date: 12/04/21   In supine: Clavicular and sternal nodes, 5 diaphragmatic breaths,bil axillary nodes and establishment of interaxillary pathway, R inguinal nodes and establishment of axilloinguinal pathway, then R UE upper arm only and Rt breast working proximal to distal but avoiding tender locations, then retracing all steps. Wall ball flexion easy, wall ball abduction x 5, back against wall flexion and abduction 2# 2 x 10 each, yellow band row and extension x 10 bil, bil ER x 10, supine D2 x 10. STM to the axilla and pectoralis border in supine using a small amt of cocoa butter .         PATIENT EDUCATION:  Education details: Lymphedema risk reduction and post op shoulder/posture HEP, self MLD Rt UE, POC Person educated: Patient Education method: Explanation, Demonstration, Handout Education comprehension: Patient verbalized understanding and returned demonstration  HOME EXERCISE PROGRAM: Self MLD for the Rt UE - per instruction section Access Code: [email or web address] URL: [email or web address] Date: 12/12/2021 Prepared by: Shan Levans  Exercises - Standing Shoulder Horizontal Abduction with Resistance  - 1 x daily - 3 x weekly - 1-3 sets - 10 reps - no hold - Standing Shoulder Single Arm PNF D2 Flexion with Resistance  - 1 x daily - 3 x weekly - 1-3 sets - 10 reps - no hold - Standing Shoulder External Rotation with Resistance  - 1 x daily - 3 x weekly - 1-3 sets - 10 reps - no hold - Standing Row with Anchored Resistance  - 1 x daily - 3 x weekly - 1-3 sets - 10 reps - no hold - Standing Shoulder Flexion with Resistance  - 1 x daily - 3 x weekly - 1-3 sets - 10 reps - no hold   GARMENTS: as of 10/10/21 pt wanted to wait  on getting a sleeve and compression shirt  ASSESSMENT: CLINICAL IMPRESSION: Patient responded well to therapy today. Continue the same treatment as last time while adding some  core strengthening and wall push ups. During wall push patient reported some breast tenderness and went away after applying pressure. Patient reports this happens with repetitive movements and sleeping at night. Patient would benefit from skilled therapy to continue to work on arm strengthening.  REHAB POTENTIAL: Excellent  CLINICAL DECISION MAKING: Changing  EVALUATION COMPLEXITY: Moderate   GOALS: Goals reviewed with patient? YES  LONG TERM GOALS: (STG=LTG)   Name Target Date Goal status  1 Pt will decrease Rt UE tingling and discomfort to 0/10 11/21/21 MET  2 Pt will be ind with MLD for the Rt upper quadrant 11/21/21 MET  3 Pt will obtain compression bra and UE sleeve as needed 11/21/21 DEFERRED   4 Pt will be ind with final HEP 11/21/21 MET     PLAN: PT FREQUENCY/DURATION: 1-2 more visits   PLAN FOR NEXT SESSION: Continued arm strengthening and incorporating more functional activities.     Rosario Jacks, Student-PT 01/27/2022, 9:43 AM  PHYSICAL THERAPY DISCHARGE SUMMARY  Visits from Start of Care: 11  Current functional level related to goals / functional outcomes: See above   Remaining deficits: Feelings of weakness, lymphedema risk   Education / Equipment: Final HEP  Plan: Patient agrees to discharge.  Did not return after last visit.

## 2022-01-28 DIAGNOSIS — Z923 Personal history of irradiation: Secondary | ICD-10-CM | POA: Diagnosis not present

## 2022-01-28 DIAGNOSIS — Z9889 Other specified postprocedural states: Secondary | ICD-10-CM | POA: Diagnosis not present

## 2022-01-28 DIAGNOSIS — Z853 Personal history of malignant neoplasm of breast: Secondary | ICD-10-CM | POA: Diagnosis not present

## 2022-01-29 ENCOUNTER — Encounter: Payer: 59 | Admitting: Rehabilitation

## 2022-02-02 ENCOUNTER — Inpatient Hospital Stay: Payer: 59 | Attending: Adult Health | Admitting: Adult Health

## 2022-02-02 ENCOUNTER — Encounter: Payer: Self-pay | Admitting: Adult Health

## 2022-02-02 VITALS — BP 116/64 | HR 77 | Temp 98.9°F | Resp 18 | Ht 69.0 in | Wt 198.8 lb

## 2022-02-02 DIAGNOSIS — Z833 Family history of diabetes mellitus: Secondary | ICD-10-CM | POA: Insufficient documentation

## 2022-02-02 DIAGNOSIS — C50311 Malignant neoplasm of lower-inner quadrant of right female breast: Secondary | ICD-10-CM | POA: Diagnosis not present

## 2022-02-02 DIAGNOSIS — Z79899 Other long term (current) drug therapy: Secondary | ICD-10-CM | POA: Insufficient documentation

## 2022-02-02 DIAGNOSIS — Z83711 Family history of hyperplastic colon polyps: Secondary | ICD-10-CM | POA: Insufficient documentation

## 2022-02-02 DIAGNOSIS — Z800 Family history of malignant neoplasm of digestive organs: Secondary | ICD-10-CM | POA: Insufficient documentation

## 2022-02-02 DIAGNOSIS — Z888 Allergy status to other drugs, medicaments and biological substances status: Secondary | ICD-10-CM | POA: Insufficient documentation

## 2022-02-02 DIAGNOSIS — Z885 Allergy status to narcotic agent status: Secondary | ICD-10-CM | POA: Diagnosis not present

## 2022-02-02 DIAGNOSIS — Z8051 Family history of malignant neoplasm of kidney: Secondary | ICD-10-CM | POA: Insufficient documentation

## 2022-02-02 DIAGNOSIS — Z170 Estrogen receptor positive status [ER+]: Secondary | ICD-10-CM | POA: Insufficient documentation

## 2022-02-02 DIAGNOSIS — Z841 Family history of disorders of kidney and ureter: Secondary | ICD-10-CM | POA: Diagnosis not present

## 2022-02-02 DIAGNOSIS — Z8249 Family history of ischemic heart disease and other diseases of the circulatory system: Secondary | ICD-10-CM | POA: Insufficient documentation

## 2022-02-02 DIAGNOSIS — Z803 Family history of malignant neoplasm of breast: Secondary | ICD-10-CM | POA: Insufficient documentation

## 2022-02-02 DIAGNOSIS — Z808 Family history of malignant neoplasm of other organs or systems: Secondary | ICD-10-CM | POA: Insufficient documentation

## 2022-02-02 DIAGNOSIS — Z7901 Long term (current) use of anticoagulants: Secondary | ICD-10-CM | POA: Insufficient documentation

## 2022-02-02 DIAGNOSIS — Z86718 Personal history of other venous thrombosis and embolism: Secondary | ICD-10-CM | POA: Insufficient documentation

## 2022-02-02 DIAGNOSIS — Z801 Family history of malignant neoplasm of trachea, bronchus and lung: Secondary | ICD-10-CM | POA: Insufficient documentation

## 2022-02-02 DIAGNOSIS — I2699 Other pulmonary embolism without acute cor pulmonale: Secondary | ICD-10-CM | POA: Insufficient documentation

## 2022-02-02 DIAGNOSIS — Z9049 Acquired absence of other specified parts of digestive tract: Secondary | ICD-10-CM | POA: Insufficient documentation

## 2022-02-02 NOTE — Progress Notes (Signed)
SURVIVORSHIP VISIT:    BRIEF ONCOLOGIC HISTORY:  Oncology History  Malignant neoplasm of lower-inner quadrant of right breast of female, estrogen receptor positive ([place])  05/29/2021 Initial Diagnosis   Screening mammogram detected right breast abnormality.  No ultrasound correlate.  Stereotactic biopsy revealed well-differentiated ductal adenocarcinoma with extracellular mucin grade 1, intermediate grade DCIS, ER 95%, PR 95%, Ki-67 less than 5%, HER2 1+   06/17/2021 Cancer Staging   Staging form: Breast, AJCC 8th Edition - Clinical: Stage Unknown (cTX, cN0, cM0, G1, ER+, PR+, HER2-) - Signed by Nicholas Lose, MD on 06/17/2021 Stage prefix: Initial diagnosis Histologic grading system: 3 grade system    Genetic Testing   [facility] CancerNext-Expanded was Negative. Report date is 06/18/2021.  The CancerNext-Expanded gene panel offered by [facility] and includes sequencing, rearrangement, and RNA analysis for the following 77 genes: AIP, ALK, APC, ATM, AXIN2, BAP1, BARD1, BLM, BMPR1A, BRCA1, BRCA2, BRIP1, CDC73, CDH1, CDK4, CDKN1B, CDKN2A, CHEK2, CTNNA1, DICER1, FANCC, FH, FLCN, GALNT12, KIF1B, LZTR1, MAX, MEN1, MET, MLH1, MSH2, MSH3, MSH6, MUTYH, NBN, NF1, NF2, NTHL1, PALB2, PHOX2B, PMS2, POT1, PRKAR1A, PTCH1, PTEN, RAD51C, RAD51D, RB1, RECQL, RET, SDHA, SDHAF2, SDHB, SDHC, SDHD, SMAD4, SMARCA4, SMARCB1, SMARCE1, STK11, SUFU, TMEM127, TP53, TSC1, TSC2, VHL and XRCC2 (sequencing and deletion/duplication); EGFR, EGLN1, HOXB13, KIT, MITF, PDGFRA, POLD1, and POLE (sequencing only); EPCAM and GREM1 (deletion/duplication only).    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 grade 2, 2.7 cm, extensive DCIS intermediate grade, i 0/1 lymph node negative, margins negative for invasive disease but positive for DCIS, ER 95%, PR 95%, HER2 negative by IHC 1+, Ki-67 less than 5%   07/22/2021 Oncotype testing   Oncotype score: 20, risk of distant recurrence  at 9 years: 6%   08/28/2021 Cancer Staging   Staging form: Breast, AJCC 8th Edition - Pathologic stage from 08/28/2021: Stage IA (pT2, pN0(sn), cM0, G2, ER+, PR+, HER2-, Oncotype DX score: 20) - Signed by Hayden Pedro, PA-C on 08/28/2021 Method of lymph node assessment: Sentinel lymph node biopsy Multigene prognostic tests performed: Oncotype DX Recurrence score range: Greater than or equal to 11 Histologic grading system: 3 grade system   09/11/2021 - 10/13/2021 Radiation Therapy   Site Technique Total Dose (Gy) Dose per Fx (Gy) Completed Fx Beam Energies  Breast, Right: Breast_R 3D 42.56/42.56 2.66 16/16 6X  Breast, Right: Breast_R_Bst 3D 8/8 2 4/4 6X       INTERVAL HISTORY:  Ms. Mette to review her survivorship care plan detailing her treatment course for breast cancer, as well as monitoring long-term side effects of that treatment, education regarding health maintenance, screening, and overall wellness and health promotion.     Overall, Ms. Hamlett reports feeling quite well.  Other than feeling that her heart is not quite back to normal since she had the pulmonary embolus she is doing well.  She is taking estradiol and is aware that it can increase her breast cancer recurrence risk.  REVIEW OF SYSTEMS:  Review of Systems  Constitutional:  Negative for appetite change, chills, fatigue, fever and unexpected weight change.  HENT:   Negative for hearing loss, lump/mass and trouble swallowing.   Eyes:  Negative for eye problems and icterus.  Respiratory:  Negative for chest tightness, cough and shortness of breath.   Cardiovascular:  Negative for chest pain, leg swelling and palpitations.  Gastrointestinal:  Negative for abdominal distention, abdominal pain, constipation, diarrhea, nausea and vomiting.  Endocrine: Negative for hot flashes.  Genitourinary:  Negative for difficulty urinating.   Musculoskeletal:  Negative for arthralgias.  Skin:  Negative for itching and rash.   Neurological:  Negative for dizziness, extremity weakness, headaches and numbness.  Hematological:  Negative for adenopathy. Does not bruise/bleed easily.  Psychiatric/Behavioral:  Negative for depression. The patient is not nervous/anxious.   Breast: Denies any new nodularity, masses, tenderness, nipple changes, or nipple discharge.      ONCOLOGY TREATMENT TEAM:  1. Surgeon:  Dr. Marlou Starks at [facility] 2. Medical Oncologist: Dr. Lindi Adie  3. Radiation Oncologist: Dr. Lisbeth Renshaw    PAST MEDICAL/SURGICAL HISTORY:  Past Medical History:  Diagnosis Date   Allergy    Arthritis    neck   Breast cancer ([place]) 05/29/2021   DVT (deep venous thrombosis) (HCC)    upper thight - right leg   Headache    migraines   HSV (herpes simplex virus) anogenital infection    Hx of   PONV (postoperative nausea and vomiting)    only with neck surgery.  No problems with any other surgery.   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[place])    Lower Extremitiy right leg   Past Surgical History:  Procedure Laterality Date   ABDOMINAL HYSTERECTOMY     amputation of lower limb Right 2001   right leg, below knee   BREAST LUMPECTOMY WITH RADIOACTIVE SEED AND SENTINEL LYMPH NODE BIOPSY Right 07/11/2021   Procedure: RIGHT BREAST LUMPECTOMY WITH RADIOACTIVE SEED AND SENTINEL LYMPH NODE BIOPSY;  Surgeon: Jovita Kussmaul, MD;  Location: [place];  Service: General;  Laterality: Right;   BREAST RECONSTRUCTION Right 07/22/2021   Procedure: BREAST RECONSTRUCTION;  Surgeon: Irene Limbo, MD;  Location: [place];  Service: Plastics;  Laterality: Right;   CHOLECYSTECTOMY  09/07/2011   Procedure: LAPAROSCOPIC CHOLECYSTECTOMY WITH INTRAOPERATIVE CHOLANGIOGRAM;  Surgeon: Haywood Lasso, MD;  Location: [place];  Service: General;  Laterality: N/A;   COLONOSCOPY  08/2010   polyps   LEG SURGERY Right 2017   LUMBAR LAMINECTOMY Left 07/17/2015   Procedure: MICRODISCECTOMY LUMBAR LAMINECTOMY  L4-L5 ON LEFT, CENTRAL  DECOMPRESSION L4-L5 FOR SPINAL STENOSIS, FORAMINOTOMY L4-L5;  Surgeon: Latanya Maudlin, MD;  Location: [facility];  Service: Orthopedics;  Laterality: Left;   MASTOPEXY Left 07/22/2021   Procedure: MASTOPEXY;  Surgeon: Irene Limbo, MD;  Location: [place];  Service: Plastics;  Laterality: Left;   NECK SURGERY     fusion w/ plate   removal of benign growth to r breast       ALLERGIES:  Allergies  Allergen Reactions   Adhesive [Tape] Other (See Comments)    Patient reports full thickness blister to adhesive - has to use paper tape   Gabapentin     Other reaction(s): Mental Status Changes (intolerance)   Tizanidine Other (See Comments)    "I feel really weak"   Tramadol Nausea And Vomiting   Wound Dressing Adhesive Other (See Comments)    Patient reports full thickness blister to adhesive   Other Rash and Other (See Comments)    Topical Agents like lotions - blisters     CURRENT MEDICATIONS:  Outpatient Encounter Medications as of 02/02/2022  Medication Sig   apixaban (ELIQUIS) 5 MG TABS tablet To twice daily for 7 days then 1 twice daily   b complex vitamins capsule Take 1 capsule by mouth daily.     cetirizine (ZYRTEC) 10 MG tablet Take by mouth.   Cholecalciferol (VITAMIN D3) 10000 units TABS Take 1,000 Units by mouth daily.  estradiol (ESTRACE) 1 MG tablet Take 0.5 mg by mouth daily.   fluticasone (FLONASE) 50 MCG/ACT nasal spray Place into both nostrils daily.   levocetirizine (XYZAL) 5 MG tablet Take 2.5 mg by mouth every evening.   Magnesium 100 MG TABS Take by mouth.   metoprolol tartrate (LOPRESSOR) 25 MG tablet Take 0.5 tablets (12.5 mg total) by mouth as needed (for palpitations/tachycardia).   Multiple Vitamin (MULTIVITAMIN WITH MINERALS) TABS tablet Take 1 tablet by mouth daily.   pregabalin (LYRICA) 75 MG capsule Take 75 mg by mouth. As needed   SUMAtriptan (IMITREX) 100 MG tablet Take 100 mg by mouth every 2 (two) hours as needed for migraine or  headache.    tretinoin (RETIN-A) 0.05 % cream Apply 1 application topically at bedtime. Apply sparingly.   No facility-administered encounter medications on file as of 02/02/2022.     ONCOLOGIC FAMILY HISTORY:  Family History  Problem Relation Age of Onset   Diabetes Mother    Kidney disease Mother        dialysis   Lung cancer Mother 61       lung   Kidney cancer Father 64   Breast cancer Father 69   Colon polyps Father        >10 precancerous colon polyps   Heart disease Father        ?   Other Sister        Skin syndrome   Colon polyps Brother    Throat cancer Brother    Other Paternal Aunt        Amyloidosis   Diabetes Maternal Grandfather    Colon cancer Other    Stomach cancer Neg Hx    Rectal cancer Neg Hx       SOCIAL HISTORY:  Social History   Socioeconomic History   Marital status: Married    Spouse name: Not on file   Number of children: Not on file   Years of education: Not on file   Highest education level: Not on file  Occupational History    Employer: [employer]  Tobacco Use   Smoking status: Never    Passive exposure: Yes   Smokeless tobacco: Never  Vaping Use   Vaping Use: Never used  Substance and Sexual Activity   Alcohol use: Yes    Alcohol/week: 1.0 standard drink of alcohol    Types: 1 Standard drinks or equivalent per week    Comment: occasional-weekends   Drug use: No   Sexual activity: Yes    Birth control/protection: None    Comment: Hysterectomy  Other Topics Concern   Not on file  Social History Narrative   Not on file   Social Determinants of Health   Financial Resource Strain: Not on file  Food Insecurity: Not on file  Transportation Needs: Not on file  Physical Activity: Not on file  Stress: Not on file  Social Connections: Not on file  Intimate Partner Violence: Not on file     OBSERVATIONS/OBJECTIVE:  BP 116/64 (BP Location: Left Arm, Patient Position: Sitting)   Pulse 77   Temp 98.9 F (37.2  C) (Tympanic)   Resp 18   Ht _0  (1.753 m)   Wt 198 lb 12.8 oz (90.2 kg)   LMP  (LMP Unknown)   SpO2 99%   BMI 29.36 kg/m   Breast exam declined LABORATORY DATA:  None for this visit.  DIAGNOSTIC IMAGING:  None for this visit.      ASSESSMENT AND  PLAN:  Ms.. Finazzo is a pleasant 61 y.o. female with Stage 1A right breast invasive ductal carcinoma, ER+/PR+/HER2-, diagnosed in 05/2021, treated with lumpectomy, adjuvant radiation therapy, and declined antiestrogen therapy.  She presents to the Survivorship Clinic for our initial meeting and routine follow-up post-completion of treatment for breast cancer.    1. Stage IA right/left breast cancer:  Ms. Devito is continuing to recover from definitive treatment for breast cancer. She will follow-up with her medical oncologist, Dr. Lindi Adie in 6 months with history and physical exam per surveillance protocol.   Her mammogram is due 04/2022; orders placed today. Today, a comprehensive survivorship care plan and treatment summary was reviewed with the patient today detailing her breast cancer diagnosis, treatment course, potential late/long-term effects of treatment, appropriate follow-up care with recommendations for the future, and patient education resources.  A copy of this summary, along with a letter will be sent to the patient's primary care provider via mail/fax/In Basket message after today's visit.    2. Bone health: For to primary care for bone density management.  She was given education on specific activities to promote bone health.  3. Cancer screening:  Due to Ms. Dail's history and her age, she should receive screening for skin cancers, colon cancer, and gynecologic cancers.  The information and recommendations are listed on the patient's comprehensive care plan/treatment summary and were reviewed in detail with the patient.    4. Health maintenance and wellness promotion: Ms. Duba was encouraged to consume 5-7 servings of fruits  and vegetables per day. We reviewed the "Nutrition Rainbow" handout.  She was also encouraged to engage in moderate to vigorous exercise for 30 minutes per day most days of the week. We discussed the [employer] fitness program, which is designed for cancer survivors to help them become more physically fit after cancer treatments.  She was instructed to limit her alcohol consumption and continue to abstain from tobacco use.     5. Support services/counseling: It is not uncommon for this period of the patient's cancer care trajectory to be one of many emotions and stressors. She was given information regarding our available services and encouraged to contact me with any questions or for help enrolling in any of our support group/programs.    Follow up instructions:    -Return to cancer center in 6 months for follow-up with Dr. Lindi Adie -Mammogram due in 04/2021 -She is welcome to return back to the Survivorship Clinic at any time; no additional follow-up needed at this time.  -Consider referral back to survivorship as a long-term survivor for continued surveillance  The patient was provided an opportunity to ask questions and all were answered. The patient agreed with the plan and demonstrated an understanding of the instructions.   Total encounter time:30 minutes*in face-to-face visit time, chart review, lab review, care coordination, order entry, and documentation of the encounter time.    Wilber Bihari, NP 02/02/22 2:51 PM Medical Oncology and Hematology [facility] [place], [place] [address] Tel. [phone]    Fax. [phone]  *Total Encounter Time as defined by the Centers for Medicare and Medicaid Services includes, in addition to the face-to-face time of a patient visit (documented in the note above) non-face-to-face time: obtaining and reviewing outside history, ordering and reviewing medications, tests or procedures, care coordination (communications  with other health care professionals or caregivers) and documentation in the medical record.

## 2022-02-03 ENCOUNTER — Ambulatory Visit: Payer: 59 | Admitting: Rehabilitation

## 2022-02-04 ENCOUNTER — Encounter: Payer: Self-pay | Admitting: Internal Medicine

## 2022-02-04 ENCOUNTER — Ambulatory Visit: Payer: 59 | Admitting: Internal Medicine

## 2022-02-04 ENCOUNTER — Encounter: Payer: 59 | Admitting: Rehabilitation

## 2022-02-04 VITALS — BP 118/81 | HR 71 | Ht 69.0 in | Wt 193.9 lb

## 2022-02-04 DIAGNOSIS — I2782 Chronic pulmonary embolism: Secondary | ICD-10-CM | POA: Diagnosis not present

## 2022-02-04 DIAGNOSIS — R002 Palpitations: Secondary | ICD-10-CM | POA: Diagnosis not present

## 2022-02-04 DIAGNOSIS — R0602 Shortness of breath: Secondary | ICD-10-CM | POA: Diagnosis not present

## 2022-02-04 NOTE — Progress Notes (Signed)
Primary Physician/Referring:  Aletha Halim., PA-C  Patient ID: Michelle Contreras, female    DOB: October 23, 1960, 61 y.o.   MRN: [id number]  Chief Complaint  Patient presents with   Palpitations   Follow-up   Results    HPI:    Michelle Contreras  is a 61 y.o. female with past medical history significant for provoked DVT many years ago, right AKA, and recent pulmonary emboli which were provoked by her breast cancer and surgery.  Patient is here for follow-up visit. Her echocardiogram and event monitor came back normal. She has a CTA of her chest which showed resolution of her pulmonary emboli. She has noticed when she has gone hiking the last few times her heart rate is really fast and she developed some chest pain and shortness of breath during this time. This concerned her because she is usually very active and this has never happened before. She has not had a stress test before and she is agreeable to this. Otherwise, she denies claudication, diaphoresis, syncope, edema, PND, orthopnea.   Past Medical History:  Diagnosis Date   Allergy    Arthritis    neck   Breast cancer ([place]) 05/29/2021   DVT (deep venous thrombosis) (HCC)    upper thight - right leg   Headache    migraines   HSV (herpes simplex virus) anogenital infection    Hx of   PONV (postoperative nausea and vomiting)    only with neck surgery.  No problems with any other surgery.   Sarcoma ([place])    Lower Extremitiy right leg   Past Surgical History:  Procedure Laterality Date   ABDOMINAL HYSTERECTOMY     amputation of lower limb Right 2001   right leg, below knee   BREAST LUMPECTOMY WITH RADIOACTIVE SEED AND SENTINEL LYMPH NODE BIOPSY Right 07/11/2021   Procedure: RIGHT BREAST LUMPECTOMY WITH RADIOACTIVE SEED AND SENTINEL LYMPH NODE BIOPSY;  Surgeon: Jovita Kussmaul, MD;  Location: [place];  Service: General;  Laterality: Right;   BREAST RECONSTRUCTION Right 07/22/2021   Procedure: BREAST  RECONSTRUCTION;  Surgeon: Irene Limbo, MD;  Location: [place];  Service: Plastics;  Laterality: Right;   CHOLECYSTECTOMY  09/07/2011   Procedure: LAPAROSCOPIC CHOLECYSTECTOMY WITH INTRAOPERATIVE CHOLANGIOGRAM;  Surgeon: Haywood Lasso, MD;  Location: [place];  Service: General;  Laterality: N/A;   COLONOSCOPY  08/2010   polyps   LEG SURGERY Right 2017   LUMBAR LAMINECTOMY Left 07/17/2015   Procedure: MICRODISCECTOMY LUMBAR LAMINECTOMY  L4-L5 ON LEFT, CENTRAL DECOMPRESSION L4-L5 FOR SPINAL STENOSIS, FORAMINOTOMY L4-L5;  Surgeon: Latanya Maudlin, MD;  Location[facility];  Service: Orthopedics;  Laterality: Left;   MASTOPEXY Left 07/22/2021   Procedure: MASTOPEXY;  Surgeon: Irene Limbo, MD;  Location: [place];  Service: Plastics;  Laterality: Left;   NECK SURGERY     fusion w/ plate   removal of benign growth to r breast     Family History  Problem Relation Age of Onset   Diabetes Mother    Kidney disease Mother        dialysis   Lung cancer Mother 32       lung   Kidney cancer Father 51   Breast cancer Father 56   Colon polyps Father        >10 precancerous colon polyps   Heart disease Father        ?   Other Sister  Skin syndrome   Colon polyps Brother    Throat cancer Brother    Other Paternal Aunt        Amyloidosis   Diabetes Maternal Grandfather    Colon cancer Other    Stomach cancer Neg Hx    Rectal cancer Neg Hx     Social History   Tobacco Use   Smoking status: Never    Passive exposure: Yes   Smokeless tobacco: Never  Substance Use Topics   Alcohol use: Yes    Alcohol/week: 1.0 standard drink of alcohol    Types: 1 Standard drinks or equivalent per week    Comment: occasional-weekends   Marital Status: Married  ROS  Review of Systems  Cardiovascular:  Positive for chest pain, dyspnea on exertion and palpitations. Negative for irregular heartbeat.  All other systems reviewed and are negative.  Objective   Blood pressure 118/81, pulse 71, height '5\' 9"'$  (1.753 m), weight 193 lb 14.4 oz (88 kg), SpO2 97 %. Body mass index is 28.63 kg/m.     02/04/2022   11:01 AM 02/02/2022    2:06 PM 12/16/2021    3:10 PM  Vitals with BMI  Height '5\' 9"'$  '5\' 9"'$  '5\' 9"'$   Weight 193 lbs 14 oz 198 lbs 13 oz 196 lbs  BMI 28.62 96.22 29.79  Systolic 892 119 417  Diastolic 81 64 65  Pulse 71 77 78     Physical Exam Constitutional:      Appearance: Normal appearance. She is normal weight.  HENT:     Head: Normocephalic and atraumatic.  Eyes:     Extraocular Movements: Extraocular movements intact.  Neck:     Vascular: No carotid bruit.  Cardiovascular:     Rate and Rhythm: Normal rate and regular rhythm.     Pulses: Normal pulses.     Heart sounds: Normal heart sounds. No murmur heard.    No friction rub. No gallop.  Pulmonary:     Effort: Pulmonary effort is normal.     Breath sounds: Normal breath sounds.  Abdominal:     General: Abdomen is flat. Bowel sounds are normal.     Palpations: Abdomen is soft.  Musculoskeletal:     Left lower leg: No edema.  Skin:    General: Skin is warm and dry.  Neurological:     Mental Status: She is alert.    Medications and allergies   Allergies  Allergen Reactions   Adhesive [Tape] Other (See Comments)    Patient reports full thickness blister to adhesive - has to use paper tape   Gabapentin     Other reaction(s): Mental Status Changes (intolerance)   Tizanidine Other (See Comments)    "I feel really weak"   Tramadol Nausea And Vomiting   Wound Dressing Adhesive Other (See Comments)    Patient reports full thickness blister to adhesive   Other Rash and Other (See Comments)    Topical Agents like lotions - blisters     Medication list after today's encounter   Current Outpatient Medications:    apixaban (ELIQUIS) 5 MG TABS tablet, To twice daily for 7 days then 1 twice daily, Disp: 60 tablet, Rfl: 4   b complex vitamins capsule, Take 1 capsule by  mouth daily.  , Disp: , Rfl:    cetirizine (ZYRTEC) 10 MG tablet, Take by mouth., Disp: , Rfl:    Cholecalciferol (VITAMIN D3) 10000 units TABS, Take 1,000 Units by mouth daily., Disp: , Rfl:  estradiol (ESTRACE) 1 MG tablet, Take 0.5 mg by mouth daily., Disp: , Rfl:    fluticasone (FLONASE) 50 MCG/ACT nasal spray, Place into both nostrils daily., Disp: , Rfl:    levocetirizine (XYZAL) 5 MG tablet, Take 2.5 mg by mouth every evening., Disp: , Rfl:    Magnesium 100 MG TABS, Take by mouth., Disp: , Rfl:    metoprolol tartrate (LOPRESSOR) 25 MG tablet, Take 0.5 tablets (12.5 mg total) by mouth as needed (for palpitations/tachycardia)., Disp: 30 tablet, Rfl: 3   Multiple Vitamin (MULTIVITAMIN WITH MINERALS) TABS tablet, Take 1 tablet by mouth daily., Disp: , Rfl:    pregabalin (LYRICA) 75 MG capsule, Take 75 mg by mouth. As needed, Disp: , Rfl:    SUMAtriptan (IMITREX) 100 MG tablet, Take 100 mg by mouth every 2 (two) hours as needed for migraine or headache. , Disp: , Rfl:    tretinoin (RETIN-A) 0.05 % cream, Apply 1 application topically at bedtime. Apply sparingly., Disp: , Rfl:   Laboratory examination:   Lab Results  Component Value Date   NA 141 07/31/2021   K 4.4 07/31/2021   CO2 26 07/31/2021   GLUCOSE 137 (H) 07/31/2021   BUN 14 07/31/2021   CREATININE 1.15 (H) 07/31/2021   CALCIUM 9.3 07/31/2021   GFRNONAA 54 (L) 07/31/2021       Latest Ref Rng & Units 07/31/2021    3:17 PM 06/25/2015    9:10 PM 09/07/2011    2:55 PM  CMP  Glucose 70 - 99 mg/dL 137  110    BUN 8 - 23 mg/dL 14  16    Creatinine 0.44 - 1.00 mg/dL 1.15  0.65  0.87   Sodium 135 - 145 mmol/L 141  133    Potassium 3.5 - 5.1 mmol/L 4.4  4.0    Chloride 98 - 111 mmol/L 107  98    CO2 22 - 32 mmol/L 26  27    Calcium 8.9 - 10.3 mg/dL 9.3  8.8    Total Protein 6.5 - 8.1 g/dL  6.5    Total Bilirubin 0.3 - 1.2 mg/dL  0.8    Alkaline Phos 38 - 126 U/L  47    AST 15 - 41 U/L  24    ALT 14 - 54 U/L  23         Latest Ref Rng & Units 07/31/2021    3:17 PM 06/25/2015    9:10 PM 09/07/2011    2:55 PM  CBC  WBC 4.0 - 10.5 K/uL 6.8  7.4  8.6   Hemoglobin 12.0 - 15.0 g/dL 13.1  13.9  10.2   Hematocrit 36.0 - 46.0 % 39.6  40.1  30.1   Platelets 150 - 400 K/uL 268  199  135     Lipid Panel No results for input(s): "CHOL", "TRIG", "[place]", "VLDL", "HDL", "CHOLHDL", "LDLDIRECT" in the last 8760 hours.  HEMOGLOBIN A1C No results found for: "HGBA1C", "MPG" TSH No results for input(s): "TSH" in the last 8760 hours.  External labs:     Radiology:   01/26/2022 CTA PE IMPRESSION: 1. The previous small segmental and subsegmental pulmonary emboli have resolved. No residual, new or progressive pulmonary emboli. 2. No acute intrathoracic abnormality.   Cardiac Studies:   Echocardiogram 12/09/2021:  Normal LV systolic function with visual EF 60-65%. Left ventricle cavity  is normal in size. Normal left ventricular wall thickness. Normal global  wall motion. Normal diastolic filling pattern, normal LAP.  Structurally normal mitral  valve.  Mild to moderate mitral regurgitation.  Structurally normal tricuspid valve with trace regurgitation. No evidence  of pulmonary hypertension.  Pericardium is normal. Trace pericardial effusion.  no prior available for comparison.   [facility] Patch Extended Out Patient EKG monitoring 11 days starting 12/24/2021:   Dominant rhythm NSR. HR 47-150 bpm. Avg HR 76 bpm. No atrial fibrillation/atrial flutter/SVT/VT/high grade AV block, sinus pause >3sec noted. Isolated SVE <1.0%, Couplet SVE <1.0%, Triplet SVE <1.0% Isolated VE 3.6%, Couplet VE <1.0%, Triplet VE <1.0% Patient triggered events: 16 Symptoms correlated with arhythmia? No   EKG:   11/03/21  ekg: NSR, RSR pattern - normal EKG  Assessment     ICD-10-CM   1. Chronic pulmonary embolism without acute cor pulmonale, unspecified pulmonary embolism type (HCC)  I27.82     2. Palpitations  R00.2        No  orders of the defined types were placed in this encounter.   No orders of the defined types were placed in this encounter.   There are no discontinued medications.    Recommendations:   Michelle Contreras is a 61 y.o.  female with past medical history significant for provoked DVT many years ago, right AKA, and recent pulmonary emboli which were provoked by her breast cancer and surgery.     Chronic pulmonary embolism without acute cor pulmonale, unspecified pulmonary embolism type (HCC) CTA PE protocol showed resolution of Pes Recommend staying on Eliquis for 1 year at least.   Palpitations Will continue PRN metoprolol for palpitations Event monitor did not show any significant arrhythmia   Shortness of breath Echocardiogram within normal limits Patient still complaining of SOB and chest pain with exertion Nuclear stress test ordered    Follow-up in 6 months or sooner if needed.     Floydene Flock, DO  02/04/2022, 11:08 AM Office: [phone] Pager: [phone]

## 2022-02-11 DIAGNOSIS — D1801 Hemangioma of skin and subcutaneous tissue: Secondary | ICD-10-CM | POA: Diagnosis not present

## 2022-02-11 DIAGNOSIS — L820 Inflamed seborrheic keratosis: Secondary | ICD-10-CM | POA: Diagnosis not present

## 2022-02-11 DIAGNOSIS — D235 Other benign neoplasm of skin of trunk: Secondary | ICD-10-CM | POA: Diagnosis not present

## 2022-02-11 DIAGNOSIS — L814 Other melanin hyperpigmentation: Secondary | ICD-10-CM | POA: Diagnosis not present

## 2022-02-11 DIAGNOSIS — L821 Other seborrheic keratosis: Secondary | ICD-10-CM | POA: Diagnosis not present

## 2022-02-11 DIAGNOSIS — L570 Actinic keratosis: Secondary | ICD-10-CM | POA: Diagnosis not present

## 2022-02-20 ENCOUNTER — Ambulatory Visit (HOSPITAL_COMMUNITY): Payer: 59

## 2022-02-26 ENCOUNTER — Ambulatory Visit: Payer: 59

## 2022-02-26 DIAGNOSIS — R0602 Shortness of breath: Secondary | ICD-10-CM | POA: Diagnosis not present

## 2022-03-01 LAB — PCV MYOCARDIAL PERFUSION WO LEXISCAN: Angina Index: 0

## 2022-03-14 ENCOUNTER — Other Ambulatory Visit: Payer: Self-pay | Admitting: Hematology and Oncology

## 2022-06-16 ENCOUNTER — Ambulatory Visit: Payer: 59 | Admitting: Internal Medicine

## 2022-07-30 ENCOUNTER — Telehealth: Payer: Self-pay | Admitting: Hematology and Oncology

## 2022-08-03 ENCOUNTER — Inpatient Hospital Stay: Payer: 59 | Admitting: Hematology and Oncology

## 2022-08-05 ENCOUNTER — Ambulatory Visit: Payer: 59 | Admitting: Internal Medicine

## 2022-08-10 DIAGNOSIS — H938X2 Other specified disorders of left ear: Secondary | ICD-10-CM | POA: Diagnosis not present

## 2022-08-10 DIAGNOSIS — L237 Allergic contact dermatitis due to plants, except food: Secondary | ICD-10-CM | POA: Diagnosis not present

## 2022-08-18 ENCOUNTER — Ambulatory Visit: Payer: Self-pay | Admitting: *Deleted

## 2022-08-18 DIAGNOSIS — R051 Acute cough: Secondary | ICD-10-CM | POA: Diagnosis not present

## 2022-08-18 NOTE — Telephone Encounter (Signed)
  Chief Complaint: Cough Symptoms: Congested cough, wheezing. Frontal headache, sinus pressure. SOB at rest. States SOB worsening. Severe fatigue Frequency: 6 days Pertinent Negatives: Patient denies fever Disposition: [] ED /[x] Urgent Care (no appt availability in office) / [] Appointment(In office/virtual)/ []  [facility]/ [] Home Care/ [] Refused Recommended Disposition /[] [facility]/ []  Follow-up with PCP Additional Notes: Advised UC. Pt asking about [facility]. Advised also an option. States will go to [facility]. Care advise provided, pt verbalizes understanding. PCP not in system. Reason for Disposition  Wheezing is present  Answer Assessment - Initial Assessment Questions 1. ONSET: "When did the cough begin?"      Last Thursday 2. SEVERITY: "How bad is the cough today?"      Awake at night 3. SPUTUM: "Describe the color of your sputum" (none, dry cough; clear, white, yellow, green)     Pale yellow 4. HEMOPTYSIS: "Are you coughing up any blood?" If so ask: "How much?" (flecks, streaks, tablespoons, etc.)     no 5. DIFFICULTY BREATHING: "Are you having difficulty breathing?" If Yes, ask: "How bad is it?" (e.g., mild, moderate, severe)    - MILD: No SOB at rest, mild SOB with walking, speaks normally in sentences, can lie down, no retractions, pulse < 100.    - MODERATE: SOB at rest, SOB with minimal exertion and prefers to sit, cannot lie down flat, speaks in phrases, mild retractions, audible wheezing, pulse 100-120.    - SEVERE: Very SOB at rest, speaks in single words, struggling to breathe, sitting hunched forward, retractions, pulse > 120      SOB at rest 6. FEVER: "Do you have a fever?" If Yes, ask: "What is your temperature, how was it measured, and when did it start?"     No 7. CARDIAC HISTORY: "Do you have any history of heart disease?" (e.g., heart attack, congestive heart failure)       8. LUNG HISTORY: "Do you have any history of lung disease?"   (e.g., pulmonary embolus, asthma, emphysema)      9. PE RISK FACTORS: "Do you have a history of blood clots?" (or: recent major surgery, recent prolonged travel, bedridden)      10. OTHER SYMPTOMS: "Do you have any other symptoms?" (e.g., runny nose, wheezing, chest pain)       Wheezing,fatigued. Headache. Covid tested this AM negative.  Protocols used: Cough - Acute Productive-A-AH

## 2022-08-24 ENCOUNTER — Other Ambulatory Visit: Payer: Self-pay | Admitting: Internal Medicine

## 2022-08-28 ENCOUNTER — Ambulatory Visit
Admission: RE | Admit: 2022-08-28 | Discharge: 2022-08-28 | Disposition: A | Discharge status: Home / Self Care | Payer: 59 | Source: Ambulatory Visit | Attending: Adult Health | Admitting: Adult Health

## 2022-08-28 DIAGNOSIS — Z853 Personal history of malignant neoplasm of breast: Secondary | ICD-10-CM | POA: Diagnosis not present

## 2022-08-28 DIAGNOSIS — C50311 Malignant neoplasm of lower-inner quadrant of right female breast: Secondary | ICD-10-CM

## 2022-12-09 DIAGNOSIS — Z853 Personal history of malignant neoplasm of breast: Secondary | ICD-10-CM | POA: Diagnosis not present

## 2022-12-09 DIAGNOSIS — Z923 Personal history of irradiation: Secondary | ICD-10-CM | POA: Diagnosis not present

## 2023-04-07 ENCOUNTER — Encounter: Payer: Self-pay | Admitting: Gastroenterology

## 2023-05-19 DIAGNOSIS — R10811 Right upper quadrant abdominal tenderness: Secondary | ICD-10-CM | POA: Diagnosis not present

## 2023-05-19 DIAGNOSIS — R1901 Right upper quadrant abdominal swelling, mass and lump: Secondary | ICD-10-CM | POA: Diagnosis not present

## 2023-05-19 DIAGNOSIS — Z89511 Acquired absence of right leg below knee: Secondary | ICD-10-CM | POA: Diagnosis not present

## 2023-05-19 DIAGNOSIS — Z9049 Acquired absence of other specified parts of digestive tract: Secondary | ICD-10-CM | POA: Diagnosis not present

## 2023-05-26 DIAGNOSIS — Z9049 Acquired absence of other specified parts of digestive tract: Secondary | ICD-10-CM | POA: Diagnosis not present

## 2023-05-26 DIAGNOSIS — R1901 Right upper quadrant abdominal swelling, mass and lump: Secondary | ICD-10-CM | POA: Diagnosis not present

## 2023-05-26 DIAGNOSIS — K760 Fatty (change of) liver, not elsewhere classified: Secondary | ICD-10-CM | POA: Diagnosis not present

## 2023-05-28 DIAGNOSIS — R1901 Right upper quadrant abdominal swelling, mass and lump: Secondary | ICD-10-CM | POA: Diagnosis not present

## 2023-06-02 NOTE — Therapy (Signed)
 OUTPATIENT PHYSICAL THERAPY BREAST CANCER EVALUATION   Patient Name: Michelle Contreras MRN: [id number] DOB:[birth year], 63 y.o., female Today's Date: 06/04/2023   PT End of Session - 06/04/23 0820     Visit Number 1    Number of Visits 5    Date for PT Re-Evaluation 07/02/23    Authorization Type none    PT Start Time 1000    PT Stop Time 1052    PT Time Calculation (min) 52 min    Activity Tolerance Patient tolerated treatment well    Behavior During Therapy [facility] for tasks assessed/performed               Past Medical History:  Diagnosis Date   Allergy    Arthritis    neck   Breast cancer (HCC) 05/29/2021   DVT (deep venous thrombosis) (HCC)    upper thight - right leg   Headache    migraines   HSV (herpes simplex virus) anogenital infection    Hx of   PONV (postoperative nausea and vomiting)    only with neck surgery.  No problems with any other surgery.   Sarcoma (HCC)    Lower Extremitiy right leg   Past Surgical History:  Procedure Laterality Date   ABDOMINAL HYSTERECTOMY     amputation of lower limb Right 2001   right leg, below knee   BREAST LUMPECTOMY WITH RADIOACTIVE SEED AND SENTINEL LYMPH NODE BIOPSY Right 07/11/2021   Procedure: RIGHT BREAST LUMPECTOMY WITH RADIOACTIVE SEED AND SENTINEL LYMPH NODE BIOPSY;  Surgeon: Griselda Miner, MD;  Location: [facility];  Service: General;  Laterality: Right;   BREAST RECONSTRUCTION Right 07/22/2021   Procedure: BREAST RECONSTRUCTION;  Surgeon: Glenna Fellows, MD;  Location: [facility];  Service: Plastics;  Laterality: Right;   CHOLECYSTECTOMY  09/07/2011   Procedure: LAPAROSCOPIC CHOLECYSTECTOMY WITH INTRAOPERATIVE CHOLANGIOGRAM;  Surgeon: Currie Paris, MD;  Location: [facility];  Service: General;  Laterality: N/A;   COLONOSCOPY  08/2010   polyps   LEG SURGERY Right 2017   LUMBAR LAMINECTOMY Left 07/17/2015   Procedure: MICRODISCECTOMY LUMBAR LAMINECTOMY  L4-L5 ON LEFT, CENTRAL  DECOMPRESSION L4-L5 FOR SPINAL STENOSIS, FORAMINOTOMY L4-L5;  Surgeon: Ranee Gosselin, MD;  Location: [facility];  Service: Orthopedics;  Laterality: Left;   MASTOPEXY Left 07/22/2021   Procedure: MASTOPEXY;  Surgeon: Glenna Fellows, MD;  Location: [facility];  Service: Plastics;  Laterality: Left;   NECK SURGERY     fusion w/ plate   removal of benign growth to r breast     Patient Active Problem List   Diagnosis Date Noted   Chronic pulmonary embolism without acute cor pulmonale (HCC) 11/03/2021   Palpitations 11/03/2021   Status post above-knee amputation of right lower extremity (HCC) 11/03/2021   Genetic testing 06/17/2021   Malignant neoplasm of lower-inner quadrant of right breast of female, estrogen receptor positive (HCC) 06/05/2021   Allergic rhinitis 11/01/2017   Acute deep vein thrombosis (DVT) of femoral vein of right lower extremity (HCC) 03/03/2016   Neuroma of amputation stump, right lower extremity (HCC) 12/23/2015   Spinal stenosis, lumbar region, with neurogenic claudication 07/17/2015   Renal cyst 09/22/2011   Acute cholecystitis 09/07/2011   Acquired absence of unspecified leg below knee (HCC) 07/10/2011   Pain in unspecified hip 07/10/2011   Seborrheic keratosis 07/07/2011   Family history of malignant neoplasm of gastrointestinal tract 08/14/2010    PCP: Richmond Campbell., PA-C  REFERRING PROVIDER: Fredrich Romans, FNP  REFERRING DIAG: Right breast Cancer  THERAPY DIAG:  Aftercare following surgery for neoplasm  Malignant neoplasm of lower-inner quadrant of right breast of female, estrogen receptor positive (HCC)  Right lower quadrant abdominal pain  ONSET DATE: 05/29/2021  SUBJECTIVE                                                                                                                                                                                           SUBJECTIVE STATEMENT: I am noticing some pain on the Rt abdomen.  It  almost feels like a cord when I stretch my Rt arm overhead.  I had an [place] and it did not show anything. Arm feeling okay.  Breast has no change.    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 1 negative lymph node. ER/PR positive, HER2 negative. Then reduction completed. Completed radiation.  She has a prior history of sarcoma of the leg for which she underwent resection and radiation but then 3 years later it relapsed with bone involvement and therefore she required amputation of the leg.   PATIENT GOALS   See what is going on with the new pain.    PAIN:  Are you having pain? YES 1/10 now up to 4/10 Rt abdomen  Worse: moving too much, messing with it too much Better: nothing really Like a bruise  PRECAUTIONS: Rt lymphedema risk ,  HAND DOMINANCE: right  WEIGHT BEARING RESTRICTIONS No  FALLS:  Has patient fallen in last 6 months? 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  Palpable in supine with skin stretch.  Seems to go from Rt ASIS to an inch or 2 above the inferior rib line with +1-2 ttp here.    UPPER EXTREMITY AROM/PROM:  A/PROM RIGHT  06/18/21  06/03/23  Shoulder extension 72 70  Shoulder flexion 164 160  Shoulder abduction 180 175  Shoulder internal rotation 55   Shoulder external rotation 105 105    (Blank rows = not tested)   Pull reproduced with Rt arm mermaid position, trunk extension in standing only   A/PROM LEFT  06/18/21  Shoulder  extension 65  Shoulder flexion 156  Shoulder abduction 180  Shoulder  internal rotation 50  Shoulder external rotation 92    (Blank rows = not tested)  LYMPHEDEMA ASSESSMENTS:   [facility] RIGHT  06/18/21 10/10/21  10 cm proximal to olecranon process 32.5 34.1  Olecranon process 28.0 29  10 cm proximal to ulnar styloid process 24.2 22.7  Just proximal to ulnar styloid process 17.9 18.6  Across hand at thumb web space 21.8 21.4  At base of 2nd digit 6.8 6.8  (Blank rows = not tested)  [facility] LEFT  06/18/21 10/10/21  10 cm proximal to olecranon process 32.9 34  Olecranon process 28.2 29.5  10 cm proximal to ulnar styloid process 23.2 22.8  Just proximal to ulnar styloid process 17.7 18  Across hand at thumb web space 21.1 21  At base of 2nd digit 6.7 7.0  (Blank rows = not tested)   L-DEX LYMPHEDEMA SCREENING: Unable secondary to prosthetic limb (right leg)  QUICK DASH SURVEY 11% From 29%  TODAY"S TREATMENT 06/04/23 Eval performed Supine STM/MFR/rolling/S curve pressure to cording with cocoa butter - less pressure and time as it can get aggravated with palpation - discussing POC and how cording here does happen and why.   Exercise:  LTR with arm out to 90deg abduction, seated mermaid stretch and education on continuing to stretch positions that pull it.     PATIENT EDUCATION:  Education details: Per today's note Person educated: Patient Education method: Explanation, Demonstration Education comprehension: Patient verbalized understanding and returned demonstration   HOME EXERCISE PROGRAM: Self MLD for the Rt UE - per instruction section Supine LTR Mermaid stretch  GARMENTS: as of 10/10/21 pt wanted to wait on getting a sleeve.    ASSESSMENT:  CLINICAL IMPRESSION: Pt returns after being more aggressive with her exercise program which includes arm swinging movements with a paddle in VR.  She has what appears to be a cord from the Rt lower ribs towards the ASIS that is  noted in lumbar extension and left side bending.  It is tender to palpation.  Pt needs fewer visits due to cost, so we will do 2 more visits and see if it is beneficial.   PT treatment/interventions: ADL/self-care home management, pt/family education, therapeutic exercise, manual therapy, taping  REHAB POTENTIAL: Excellent  CLINICAL DECISION MAKING: Changing  EVALUATION COMPLEXITY: Moderate   GOALS: Goals reviewed with patient? YES  LONG TERM GOALS: (STG=LTG)   Name Target Date Goal status  1 Pt will report no limitations with lumbar extension and left sidebending motions  07/02/23 NEW  2 Pt will be ind with final HEP for continued stretching 07/02/23 NEW  3     4        PLAN: PT FREQUENCY/DURATION: 1x per week x up to 4 weeks   PLAN FOR NEXT SESSION: Cording work Rt abdomen, try taping, prone extension?    Idamae Lusher, PT 06/04/2023, 8:21 AM

## 2023-06-03 ENCOUNTER — Ambulatory Visit: Attending: Nurse Practitioner | Admitting: Rehabilitation

## 2023-06-03 DIAGNOSIS — R1031 Right lower quadrant pain: Secondary | ICD-10-CM | POA: Insufficient documentation

## 2023-06-03 DIAGNOSIS — C50311 Malignant neoplasm of lower-inner quadrant of right female breast: Secondary | ICD-10-CM | POA: Diagnosis not present

## 2023-06-03 DIAGNOSIS — Z483 Aftercare following surgery for neoplasm: Secondary | ICD-10-CM | POA: Insufficient documentation

## 2023-06-03 DIAGNOSIS — R293 Abnormal posture: Secondary | ICD-10-CM | POA: Insufficient documentation

## 2023-06-03 DIAGNOSIS — Z170 Estrogen receptor positive status [ER+]: Secondary | ICD-10-CM | POA: Diagnosis not present

## 2023-06-04 ENCOUNTER — Other Ambulatory Visit: Payer: Self-pay

## 2023-06-04 ENCOUNTER — Encounter: Payer: Self-pay | Admitting: Rehabilitation

## 2023-06-08 ENCOUNTER — Ambulatory Visit: Admitting: Rehabilitation

## 2023-06-08 ENCOUNTER — Encounter: Payer: Self-pay | Admitting: Rehabilitation

## 2023-06-08 DIAGNOSIS — R1031 Right lower quadrant pain: Secondary | ICD-10-CM | POA: Diagnosis not present

## 2023-06-08 DIAGNOSIS — R293 Abnormal posture: Secondary | ICD-10-CM

## 2023-06-08 DIAGNOSIS — Z483 Aftercare following surgery for neoplasm: Secondary | ICD-10-CM | POA: Diagnosis not present

## 2023-06-08 DIAGNOSIS — Z170 Estrogen receptor positive status [ER+]: Secondary | ICD-10-CM | POA: Diagnosis not present

## 2023-06-08 DIAGNOSIS — C50311 Malignant neoplasm of lower-inner quadrant of right female breast: Secondary | ICD-10-CM | POA: Diagnosis not present

## 2023-06-08 NOTE — Therapy (Signed)
 OUTPATIENT PHYSICAL THERAPY BREAST CANCER TREATMENT   Patient Name: Michelle Contreras MRN: [id number] DOB:[birth year], 63 y.o., female Today's Date: 06/08/2023   PT End of Session - 06/08/23 1609     Visit Number 2    Number of Visits 5    Date for PT Re-Evaluation 07/02/23    PT Start Time 1200    PT Stop Time 1253    PT Time Calculation (min) 53 min    Activity Tolerance Patient tolerated treatment well    Behavior During Therapy [facility] for tasks assessed/performed                Past Medical History:  Diagnosis Date   Allergy    Arthritis    neck   Breast cancer (HCC) 05/29/2021   DVT (deep venous thrombosis) (HCC)    upper thight - right leg   Headache    migraines   HSV (herpes simplex virus) anogenital infection    Hx of   PONV (postoperative nausea and vomiting)    only with neck surgery.  No problems with any other surgery.   Sarcoma (HCC)    Lower Extremitiy right leg   Past Surgical History:  Procedure Laterality Date   ABDOMINAL HYSTERECTOMY     amputation of lower limb Right 2001   right leg, below knee   BREAST LUMPECTOMY WITH RADIOACTIVE SEED AND SENTINEL LYMPH NODE BIOPSY Right 07/11/2021   Procedure: RIGHT BREAST LUMPECTOMY WITH RADIOACTIVE SEED AND SENTINEL LYMPH NODE BIOPSY;  Surgeon: Griselda Miner, MD;  Location: [facility];  Service: General;  Laterality: Right;   BREAST RECONSTRUCTION Right 07/22/2021   Procedure: BREAST RECONSTRUCTION;  Surgeon: Glenna Fellows, MD;  Location: [facility];  Service: Plastics;  Laterality: Right;   CHOLECYSTECTOMY  09/07/2011   Procedure: LAPAROSCOPIC CHOLECYSTECTOMY WITH INTRAOPERATIVE CHOLANGIOGRAM;  Surgeon: Currie Paris, MD;  Location: [facility];  Service: General;  Laterality: N/A;   COLONOSCOPY  08/2010   polyps   LEG SURGERY Right 2017   LUMBAR LAMINECTOMY Left 07/17/2015   Procedure: MICRODISCECTOMY LUMBAR LAMINECTOMY  L4-L5 ON LEFT, CENTRAL DECOMPRESSION L4-L5 FOR SPINAL  STENOSIS, FORAMINOTOMY L4-L5;  Surgeon: Ranee Gosselin, MD;  Location: [facility];  Service: Orthopedics;  Laterality: Left;   MASTOPEXY Left 07/22/2021   Procedure: MASTOPEXY;  Surgeon: Glenna Fellows, MD;  Location: [facility];  Service: Plastics;  Laterality: Left;   NECK SURGERY     fusion w/ plate   removal of benign growth to r breast     Patient Active Problem List   Diagnosis Date Noted   Chronic pulmonary embolism without acute cor pulmonale (HCC) 11/03/2021   Palpitations 11/03/2021   Status post above-knee amputation of right lower extremity (HCC) 11/03/2021   Genetic testing 06/17/2021   Malignant neoplasm of lower-inner quadrant of right breast of female, estrogen receptor positive (HCC) 06/05/2021   Allergic rhinitis 11/01/2017   Acute deep vein thrombosis (DVT) of femoral vein of right lower extremity (HCC) 03/03/2016   Neuroma of amputation stump, right lower extremity (HCC) 12/23/2015   Spinal stenosis, lumbar region, with neurogenic claudication 07/17/2015   Renal cyst 09/22/2011   Acute cholecystitis 09/07/2011   Acquired absence of unspecified leg below knee (HCC) 07/10/2011   Pain in unspecified hip 07/10/2011   Seborrheic keratosis 07/07/2011   Family history of malignant neoplasm of gastrointestinal tract 08/14/2010    PCP: Richmond Campbell., PA-C  REFERRING PROVIDER: Fredrich Romans, FNP  REFERRING DIAG: Right breast  Cancer  THERAPY DIAG:  Aftercare following surgery for neoplasm  Malignant neoplasm of lower-inner quadrant of right breast of female, estrogen receptor positive (HCC)  Right lower quadrant abdominal pain  Abnormal posture  ONSET DATE: 05/29/2021  SUBJECTIVE                                                                                                                                                                                           SUBJECTIVE STATEMENT: It wasn't any worse after last time.  About the same.     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 1 negative lymph node. ER/PR positive, HER2 negative. Then reduction completed. Completed radiation.  She has a prior history of sarcoma of the leg for which she underwent resection and radiation but then 3 years later it relapsed with bone involvement and therefore she required amputation of the leg.   PATIENT GOALS   See what is going on with the new pain.    PAIN:  Are you having pain? YES 1/10 now up to 4/10 Rt abdomen  Worse: moving too much, messing with it too much Better: nothing really Like a bruise  PRECAUTIONS: Rt lymphedema risk ,  HAND DOMINANCE: right  WEIGHT BEARING RESTRICTIONS No  FALLS:  Has patient fallen in last 6 months? 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  Palpable in supine with skin stretch.  Seems to go from Rt ASIS to an inch or 2 above the inferior rib line with +1-2 ttp here.    UPPER EXTREMITY AROM/PROM:  A/PROM RIGHT  06/18/21  06/03/23  Shoulder extension 72 70  Shoulder flexion 164 160  Shoulder abduction 180 175  Shoulder internal rotation 55   Shoulder external rotation 105 105    (Blank rows = not tested)   Pull reproduced with Rt arm mermaid position, trunk extension in standing only   A/PROM LEFT  06/18/21  Shoulder extension 65  Shoulder flexion 156  Shoulder abduction 180  Shoulder internal rotation 50  Shoulder external rotation 92    (Blank rows = not  tested)  LYMPHEDEMA ASSESSMENTS:   [facility] RIGHT  06/18/21 10/10/21  10 cm proximal to olecranon  process 32.5 34.1  Olecranon process 28.0 29  10 cm proximal to ulnar styloid process 24.2 22.7  Just proximal to ulnar styloid process 17.9 18.6  Across hand at thumb web space 21.8 21.4  At base of 2nd digit 6.8 6.8  (Blank rows = not tested)  [facility] LEFT  06/18/21 10/10/21  10 cm proximal to olecranon process 32.9 34  Olecranon process 28.2 29.5  10 cm proximal to ulnar styloid process 23.2 22.8  Just proximal to ulnar styloid process 17.7 18  Across hand at thumb web space 21.1 21  At base of 2nd digit 6.7 7.0  (Blank rows = not tested)   L-DEX LYMPHEDEMA SCREENING: Unable secondary to prosthetic limb (right leg)  QUICK DASH SURVEY 11% From 29%  TODAY"S TREATMENT 06/08/23 Supine STM/MFR/rolling/S curve pressure to cording with cocoa butter, stretch of both ends as possible / skin stretch but unable to palpate origin as it seems to be under the ribs.  STM in Left sidelying over 1 pillow with Rt arm overhead including some Rt ASIS stretch forward and backward Open book stretch 3x10" but with increased back pain today.    06/04/23 Eval performed Supine STM/MFR/rolling/S curve pressure to cording with cocoa butter - less pressure and time as it can get aggravated with palpation - discussing POC and how cording here does happen and why.   Exercise:  LTR with arm out to 90deg abduction, seated mermaid stretch and education on continuing to stretch positions that pull it.     PATIENT EDUCATION:  Education details: Per today's note Person educated: Patient Education method: Explanation, Demonstration Education comprehension: Patient verbalized understanding and returned demonstration   HOME EXERCISE PROGRAM: Self MLD for the Rt UE - per instruction section Supine LTR Mermaid stretch  GARMENTS: as of 10/10/21 pt wanted to wait on getting a sleeve.    ASSESSMENT:  CLINICAL  IMPRESSION: Continued Rt abdominal cording release although hard to get much tension due to the location.  Pt was having some low back pain today so we limited stretches.   PT treatment/interventions: ADL/self-care home management, pt/family education, therapeutic exercise, manual therapy, taping  REHAB POTENTIAL: Excellent  CLINICAL DECISION MAKING: Changing  EVALUATION COMPLEXITY: Moderate   GOALS: Goals reviewed with patient? YES  LONG TERM GOALS: (STG=LTG)   Name Target Date Goal status  1 Pt will report no limitations with lumbar extension and left sidebending motions  07/02/23 NEW  2 Pt will be ind with final HEP for continued stretching 07/02/23 NEW  3     4        PLAN: PT FREQUENCY/DURATION: 1x per week x up to 4 weeks   PLAN FOR NEXT SESSION: Cording work Rt abdomen, try taping, prone extension?    Idamae Lusher, PT 06/08/2023, 4:11 PM

## 2023-06-17 ENCOUNTER — Ambulatory Visit: Admitting: Rehabilitation

## 2023-06-17 ENCOUNTER — Encounter: Payer: Self-pay | Admitting: Rehabilitation

## 2023-06-17 DIAGNOSIS — C50311 Malignant neoplasm of lower-inner quadrant of right female breast: Secondary | ICD-10-CM | POA: Diagnosis not present

## 2023-06-17 DIAGNOSIS — Z170 Estrogen receptor positive status [ER+]: Secondary | ICD-10-CM

## 2023-06-17 DIAGNOSIS — Z483 Aftercare following surgery for neoplasm: Secondary | ICD-10-CM | POA: Diagnosis not present

## 2023-06-17 DIAGNOSIS — R293 Abnormal posture: Secondary | ICD-10-CM

## 2023-06-17 DIAGNOSIS — R1031 Right lower quadrant pain: Secondary | ICD-10-CM

## 2023-06-17 NOTE — Therapy (Signed)
 OUTPATIENT PHYSICAL THERAPY BREAST CANCER TREATMENT   Patient Name: Michelle Contreras MRN: [id number] DOB:11-Aug-1960, 63 y.o., female Today's Date: 06/17/2023   PT End of Session - 06/17/23 1102     Visit Number 3    Number of Visits 5    Date for PT Re-Evaluation 07/02/23    PT Start Time 1102    PT Stop Time 1148    PT Time Calculation (min) 46 min    Activity Tolerance Patient tolerated treatment well    Behavior During Therapy [facility] for tasks assessed/performed                 Past Medical History:  Diagnosis Date   Allergy    Arthritis    neck   Breast cancer (HCC) 05/29/2021   DVT (deep venous thrombosis) (HCC)    upper thight - right leg   Headache    migraines   HSV (herpes simplex virus) anogenital infection    Hx of   PONV (postoperative nausea and vomiting)    only with neck surgery.  No problems with any other surgery.   Sarcoma (HCC)    Lower Extremitiy right leg   Past Surgical History:  Procedure Laterality Date   ABDOMINAL HYSTERECTOMY     amputation of lower limb Right 2001   right leg, below knee   BREAST LUMPECTOMY WITH RADIOACTIVE SEED AND SENTINEL LYMPH NODE BIOPSY Right 07/11/2021   Procedure: RIGHT BREAST LUMPECTOMY WITH RADIOACTIVE SEED AND SENTINEL LYMPH NODE BIOPSY;  Surgeon: Griselda Miner, MD;  Location: [facility];  Service: General;  Laterality: Right;   BREAST RECONSTRUCTION Right 07/22/2021   Procedure: BREAST RECONSTRUCTION;  Surgeon: Glenna Fellows, MD;  Location: [facility];  Service: Plastics;  Laterality: Right;   CHOLECYSTECTOMY  09/07/2011   Procedure: LAPAROSCOPIC CHOLECYSTECTOMY WITH INTRAOPERATIVE CHOLANGIOGRAM;  Surgeon: Currie Paris, MD;  Location: [facility];  Service: General;  Laterality: N/A;   COLONOSCOPY  08/2010   polyps   LEG SURGERY Right 2017   LUMBAR LAMINECTOMY Left 07/17/2015   Procedure: MICRODISCECTOMY LUMBAR LAMINECTOMY  L4-L5 ON LEFT, CENTRAL DECOMPRESSION L4-L5 FOR SPINAL  STENOSIS, FORAMINOTOMY L4-L5;  Surgeon: Ranee Gosselin, MD;  Location: [facility];  Service: Orthopedics;  Laterality: Left;   MASTOPEXY Left 07/22/2021   Procedure: MASTOPEXY;  Surgeon: Glenna Fellows, MD;  Location:  [facility];  Service: Plastics;  Laterality: Left;   NECK SURGERY     fusion w/ plate   removal of benign growth to r breast     Patient Active Problem List   Diagnosis Date Noted   Chronic pulmonary embolism without acute cor pulmonale (HCC) 11/03/2021   Palpitations 11/03/2021   Status post above-knee amputation of right lower extremity (HCC) 11/03/2021   Genetic testing 06/17/2021   Malignant neoplasm of lower-inner quadrant of right breast of female, estrogen receptor positive (HCC) 06/05/2021   Allergic rhinitis 11/01/2017   Acute deep vein thrombosis (DVT) of femoral vein of right lower extremity (HCC) 03/03/2016   Neuroma of amputation stump, right lower extremity (HCC) 12/23/2015   Spinal stenosis, lumbar region, with neurogenic claudication 07/17/2015   Renal cyst 09/22/2011   Acute cholecystitis 09/07/2011   Acquired absence of unspecified leg below knee (HCC) 07/10/2011   Pain in unspecified hip 07/10/2011   Seborrheic keratosis 07/07/2011   Family history of malignant neoplasm of gastrointestinal tract 08/14/2010    PCP: Richmond Campbell., PA-C  REFERRING PROVIDER: Fredrich Romans, FNP  REFERRING DIAG: Right  breast Cancer  THERAPY DIAG:  Aftercare following surgery for neoplasm  Malignant neoplasm of lower-inner quadrant of right breast of female, estrogen receptor positive (HCC)  Right lower quadrant abdominal pain  Abnormal posture  ONSET DATE: 05/29/2021  SUBJECTIVE                                                                                                                                                                                           SUBJECTIVE STATEMENT: I was able to go golfing without issues.  It didn't really  bother me much maybe a few stabbing pains.  I think I will be done after today for cost reasons.   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 1 negative lymph node. ER/PR positive, HER2 negative. Then reduction completed. Completed radiation.  She has a prior history of sarcoma of the leg for which she underwent resection and radiation but then 3 years later it relapsed with bone involvement and therefore she required amputation of the leg.   PATIENT GOALS   See what is going on with the new pain.    PAIN:  Are you having pain? YES 1/10 now up to 4/10 Rt abdomen  Worse: moving too much, messing with it too much Better: nothing really Like a bruise  PRECAUTIONS: Rt lymphedema risk ,  HAND DOMINANCE: right  WEIGHT BEARING RESTRICTIONS No  FALLS:  Has patient fallen in last 6 months? 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  Palpable in supine with skin stretch.  Seems to go from Rt ASIS to an inch or 2 above the inferior rib line with +1-2 ttp here.    UPPER EXTREMITY AROM/PROM:  A/PROM RIGHT  06/18/21  06/03/23  Shoulder extension 72 70  Shoulder flexion 164 160  Shoulder abduction 180 175  Shoulder internal rotation 55   Shoulder external rotation 105 105    (Blank rows = not tested)   Pull reproduced with Rt arm mermaid position, trunk extension in standing only   A/PROM LEFT  06/18/21  Shoulder extension 65  Shoulder flexion 156  Shoulder abduction  180  Shoulder internal rotation 50  Shoulder external rotation 92    (  Blank rows = not tested)  LYMPHEDEMA ASSESSMENTS:   LANDMARK RIGHT  06/18/21 10/10/21  10 cm proximal to olecranon process 32.5 34.1  Olecranon process 28.0 29  10 cm proximal to ulnar styloid process 24.2 22.7  Just proximal to ulnar styloid process 17.9 18.6  Across hand at thumb web space 21.8 21.4  At base of 2nd digit 6.8 6.8  (Blank rows = not tested)  [facility] LEFT  06/18/21 10/10/21  10 cm proximal to olecranon process 32.9 34  Olecranon process 28.2 29.5  10 cm proximal to ulnar styloid process 23.2 22.8  Just proximal to ulnar styloid process 17.7 18  Across hand at thumb web space 21.1 21  At base of 2nd digit 6.7 7.0  (Blank rows = not tested)   L-DEX LYMPHEDEMA SCREENING: Unable secondary to prosthetic limb (right leg)  QUICK DASH SURVEY 11% From 29%  TODAY"S TREATMENT 06/17/23 Supine STM/MFR/rolling/S curve pressure to cording with cocoa butter, stretch of both ends as possible / skin stretch but unable to palpate origin as it seems to be under the ribs.  Back pain limiting exercise today - discussed prone press ups and continuing stretches Pt feels ready for self care  06/08/23 Supine STM/MFR/rolling/S curve pressure to cording with cocoa butter, stretch of both ends as possible / skin stretch but unable to palpate origin as it seems to be under the ribs.  STM in Left sidelying over 1 pillow with Rt arm overhead including some Rt ASIS stretch forward and backward Open book stretch 3x10" but with increased back pain today.    06/04/23 Eval performed Supine STM/MFR/rolling/S curve pressure to cording with cocoa butter - less pressure and time as it can get aggravated with palpation - discussing POC and how cording here does happen and why.   Exercise:  LTR with arm out to 90deg abduction, seated mermaid stretch and education on continuing to stretch positions that pull it.     PATIENT  EDUCATION:  Education details: Per today's note Person educated: Patient Education method: Explanation, Demonstration Education comprehension: Patient verbalized understanding and returned demonstration   HOME EXERCISE PROGRAM: Self MLD for the Rt UE - per instruction section Supine LTR Mermaid stretch  GARMENTS: as of 10/10/21 pt wanted to wait on getting a sleeve.    ASSESSMENT:  CLINICAL IMPRESSION: Continued Rt abdominal cording release although hard to get much tension due to the location.  Pt was having some low back pain today so we limited stretches.   PT treatment/interventions: ADL/self-care home management, pt/family education, therapeutic exercise, manual therapy, taping  REHAB POTENTIAL: Excellent  CLINICAL DECISION MAKING: Changing  EVALUATION COMPLEXITY: Moderate   GOALS: Goals reviewed with patient? YES  LONG TERM GOALS: (STG=LTG)   Name Target Date Goal status  1 Pt will report no limitations with lumbar extension and left sidebending motions  07/02/23 MET  2 Pt will be ind with final HEP for continued stretching 07/02/23 MET  3     4        PLAN: PT FREQUENCY/DURATION: 1x per week x up to 4 weeks   PLAN FOR NEXT SESSION: Cording work Rt abdomen, try taping, prone extension?    Idamae Lusher, PT 06/17/2023, 12:01 PM  PHYSICAL THERAPY DISCHARGE SUMMARY  Visits from Start of Care: 3  Current functional level related to goals / functional outcomes: See above   Remaining deficits: Cording in the Rt abdomen   Education / Equipment: Self care   Plan: Patient agrees to  discharge.  Patient is being discharged due to meeting the stated rehab goals.

## 2023-09-17 IMAGING — MG MM DIGITAL SCREENING BILAT W/ TOMO AND CAD
6 of 10 series · 6 of 30 positions shown · non-contrast
Comparison: Previous exam(s).

CLINICAL DATA: Screening.

EXAM:
DIGITAL SCREENING BILATERAL MAMMOGRAM WITH TOMOSYNTHESIS AND CAD
TECHNIQUE: Bilateral screening digital craniocaudal and mediolateral oblique
mammograms were obtained. Bilateral screening digital breast
tomosynthesis was performed. The images were evaluated with
computer-aided detection.

[L CC synth-2D]
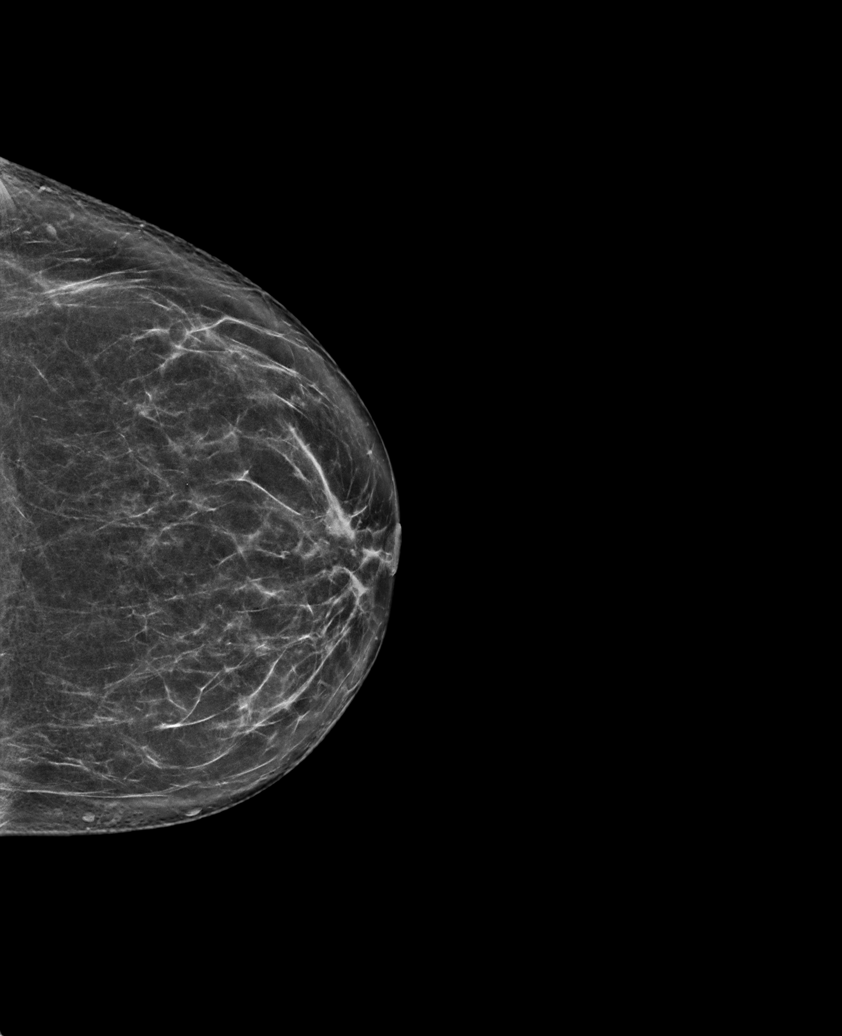

[R MLO synth-2D (1 of 2)]
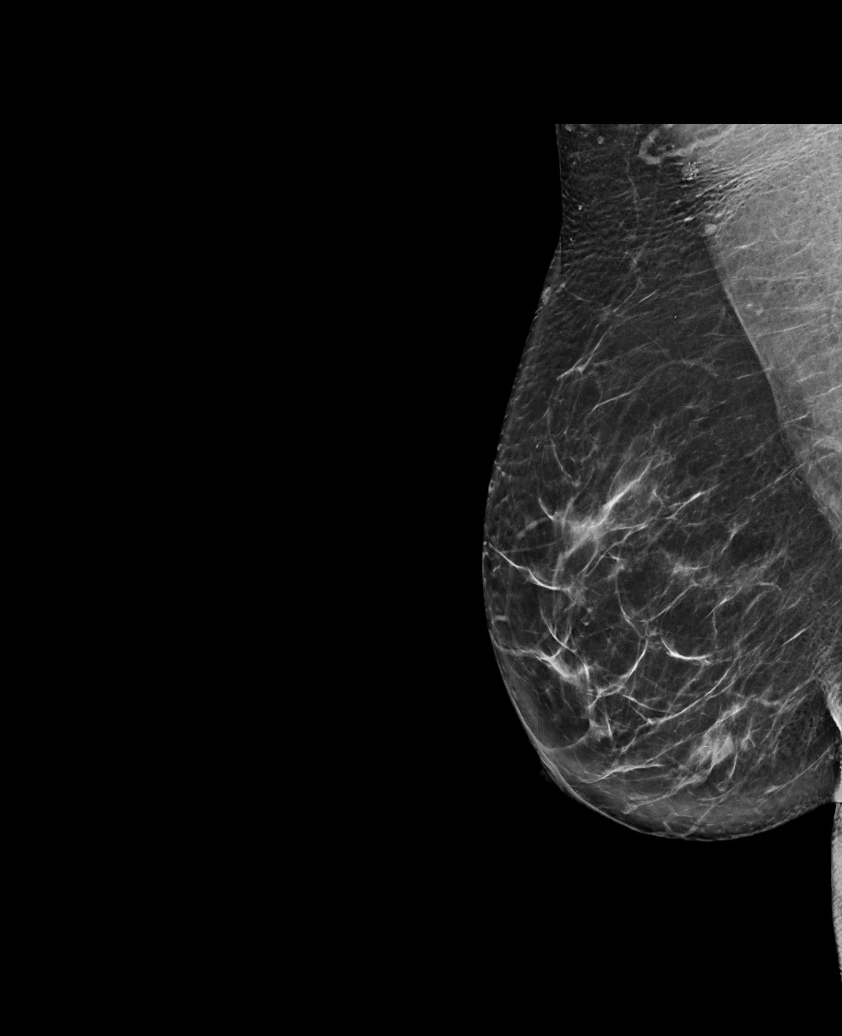

[R CC synth-2D]
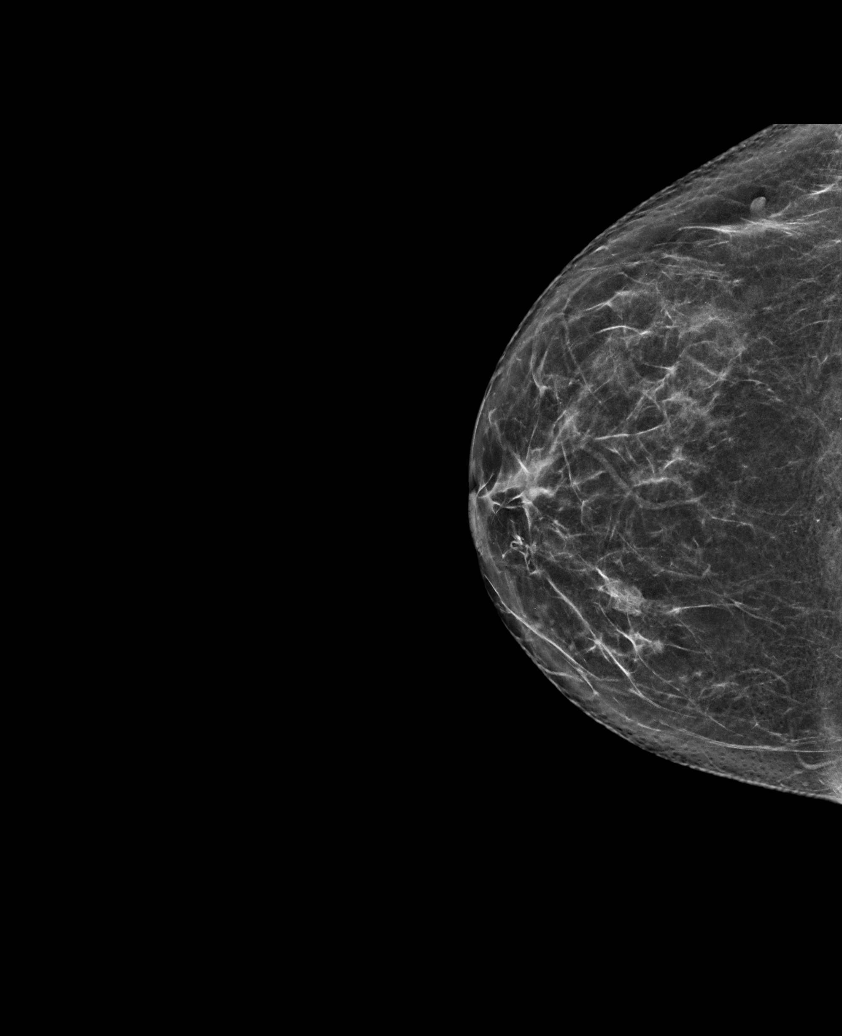

[L MLO synth-2D]
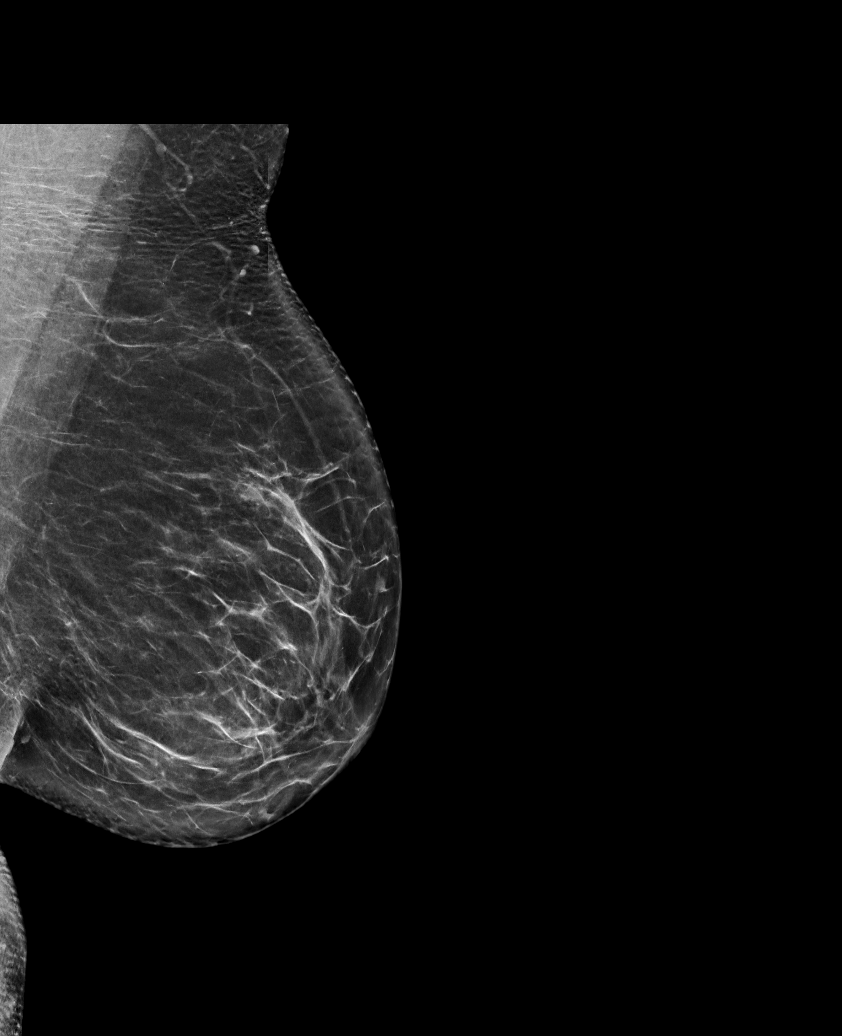

[R MLO synth-2D (2 of 2)]
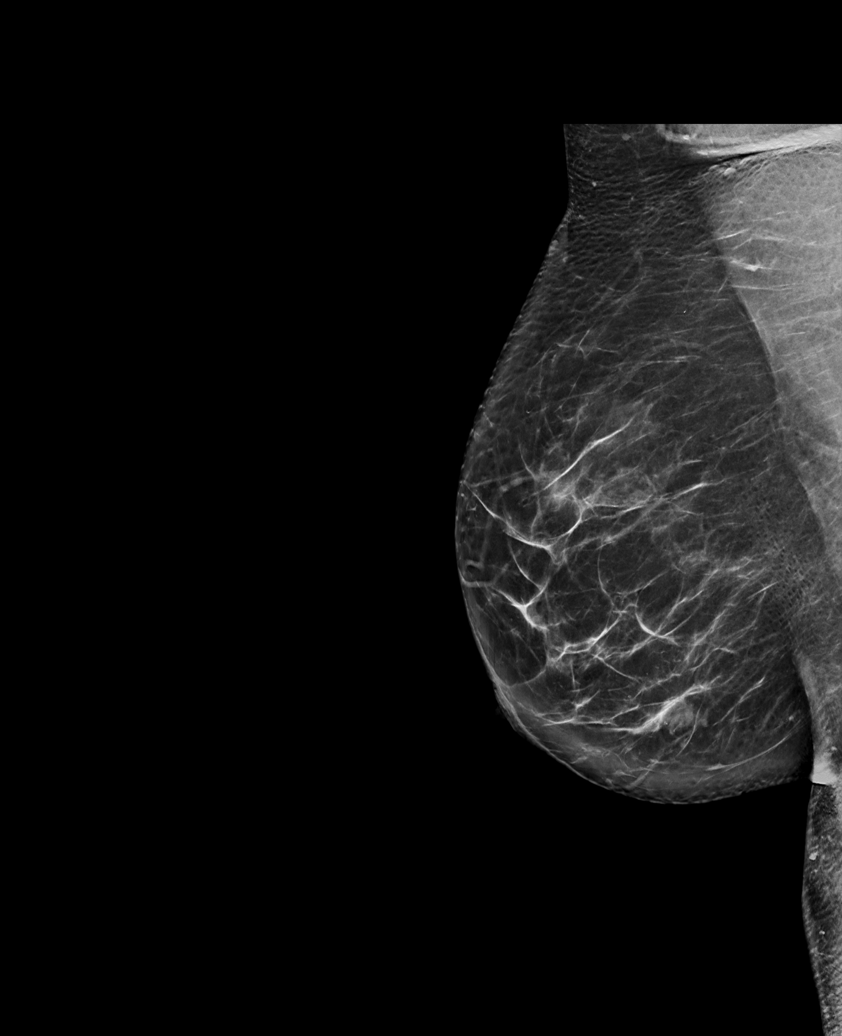

[L CC tomo · tomo slice 37/72.0]
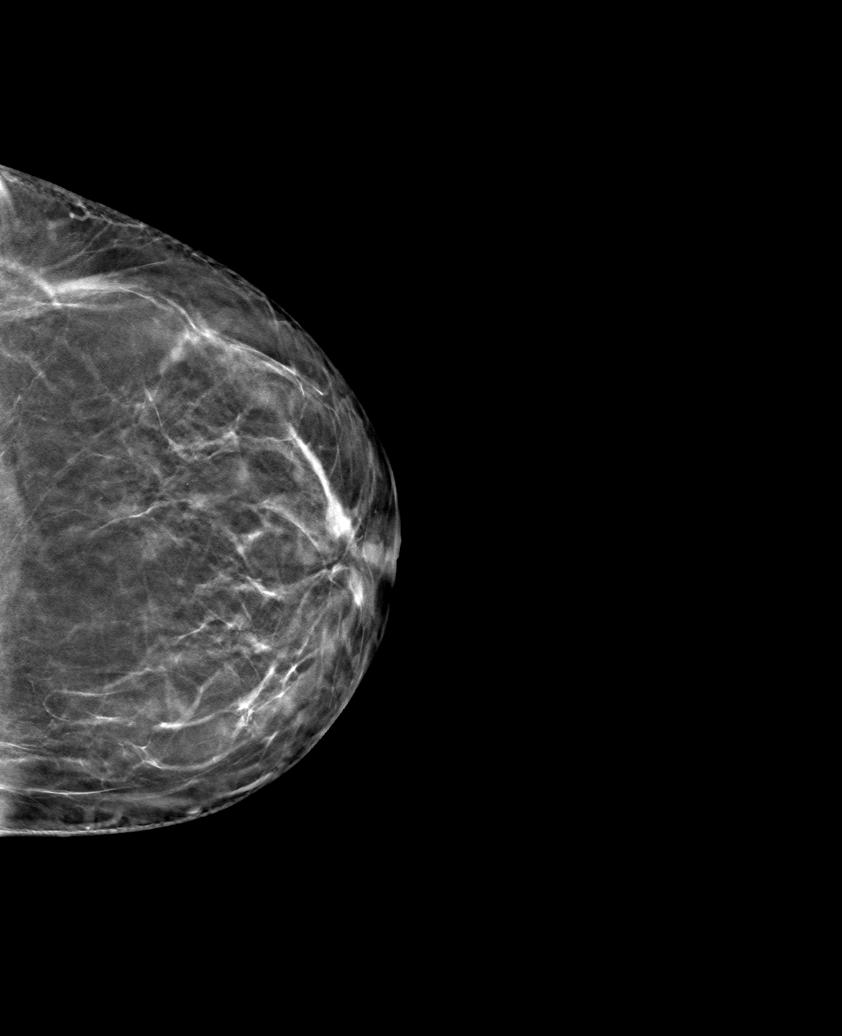

[6 of 30 positions shown; findings below may reference images not displayed]

ACR Breast Density Category b: There are scattered areas of
fibroglandular density.
FINDINGS: In the right breast, a possible mass warrants further evaluation. In
the left breast, no findings suspicious for malignancy.
IMPRESSION: Further evaluation is suggested for a possible mass in the right
breast.

RECOMMENDATION:
Diagnostic mammogram and possibly ultrasound of the right breast.
(Code:[id number])

The patient will be contacted regarding the findings, and additional
imaging will be scheduled.

BI-RADS CATEGORY  0: Incomplete. Need additional imaging evaluation
and/or prior mammograms for comparison.

## 2023-09-18 DIAGNOSIS — W2103XA Struck by baseball, initial encounter: Secondary | ICD-10-CM | POA: Diagnosis not present

## 2023-09-18 DIAGNOSIS — Y92320 Baseball field as the place of occurrence of the external cause: Secondary | ICD-10-CM | POA: Diagnosis not present

## 2023-09-18 DIAGNOSIS — S0990XA Unspecified injury of head, initial encounter: Secondary | ICD-10-CM | POA: Diagnosis not present

## 2023-09-18 DIAGNOSIS — M79672 Pain in left foot: Secondary | ICD-10-CM | POA: Diagnosis not present

## 2023-09-18 DIAGNOSIS — R519 Headache, unspecified: Secondary | ICD-10-CM | POA: Diagnosis not present

## 2023-09-18 DIAGNOSIS — S99922A Unspecified injury of left foot, initial encounter: Secondary | ICD-10-CM | POA: Diagnosis not present

## 2023-09-18 DIAGNOSIS — S9032XA Contusion of left foot, initial encounter: Secondary | ICD-10-CM | POA: Diagnosis not present

## 2023-09-18 DIAGNOSIS — S0003XA Contusion of scalp, initial encounter: Secondary | ICD-10-CM | POA: Diagnosis not present

## 2023-10-08 ENCOUNTER — Other Ambulatory Visit: Payer: Self-pay | Admitting: Adult Health

## 2023-10-08 DIAGNOSIS — Z9889 Other specified postprocedural states: Secondary | ICD-10-CM

## 2023-10-28 ENCOUNTER — Ambulatory Visit
Admission: RE | Admit: 2023-10-28 | Discharge: 2023-10-28 | Disposition: A | Discharge status: Home / Self Care | Source: Ambulatory Visit | Attending: Adult Health | Admitting: Adult Health

## 2023-10-28 DIAGNOSIS — R928 Other abnormal and inconclusive findings on diagnostic imaging of breast: Secondary | ICD-10-CM | POA: Diagnosis not present

## 2023-10-28 DIAGNOSIS — Z9889 Other specified postprocedural states: Secondary | ICD-10-CM

## 2023-10-29 ENCOUNTER — Other Ambulatory Visit: Payer: Self-pay | Admitting: Adult Health

## 2023-10-29 DIAGNOSIS — R921 Mammographic calcification found on diagnostic imaging of breast: Secondary | ICD-10-CM

## 2023-12-01 DIAGNOSIS — M25562 Pain in left knee: Secondary | ICD-10-CM | POA: Diagnosis not present

## 2023-12-01 DIAGNOSIS — N959 Unspecified menopausal and perimenopausal disorder: Secondary | ICD-10-CM | POA: Diagnosis not present

## 2023-12-01 DIAGNOSIS — E663 Overweight: Secondary | ICD-10-CM | POA: Diagnosis not present

## 2023-12-01 DIAGNOSIS — Z89611 Acquired absence of right leg above knee: Secondary | ICD-10-CM | POA: Diagnosis not present

## 2023-12-01 DIAGNOSIS — T8733 Neuroma of amputation stump, right lower extremity: Secondary | ICD-10-CM | POA: Diagnosis not present

## 2023-12-01 DIAGNOSIS — Z9189 Other specified personal risk factors, not elsewhere classified: Secondary | ICD-10-CM | POA: Diagnosis not present

## 2023-12-07 DIAGNOSIS — M25562 Pain in left knee: Secondary | ICD-10-CM | POA: Diagnosis not present

## 2024-04-14 NOTE — Telephone Encounter (Signed)
 Incoming call 07/30/2022.

## 2024-05-01 ENCOUNTER — Encounter

## 2024-05-12 ENCOUNTER — Encounter
